# Patient Record
Sex: Female | Born: 1965 | Race: White | Hispanic: No | Smoking: Former smoker
Health system: Southern US, Community
[De-identification: ages and names within clinical notes are randomized; demographics above are authoritative.]

## PROBLEM LIST (undated history)

## (undated) DIAGNOSIS — R519 Headache, unspecified: Secondary | ICD-10-CM

## (undated) DIAGNOSIS — M5136 Other intervertebral disc degeneration, lumbar region: Secondary | ICD-10-CM

## (undated) DIAGNOSIS — I10 Essential (primary) hypertension: Secondary | ICD-10-CM

## (undated) DIAGNOSIS — G8929 Other chronic pain: Secondary | ICD-10-CM

## (undated) DIAGNOSIS — M1990 Unspecified osteoarthritis, unspecified site: Secondary | ICD-10-CM

## (undated) DIAGNOSIS — M549 Dorsalgia, unspecified: Secondary | ICD-10-CM

## (undated) DIAGNOSIS — R51 Headache: Secondary | ICD-10-CM

## (undated) DIAGNOSIS — M51369 Other intervertebral disc degeneration, lumbar region without mention of lumbar back pain or lower extremity pain: Secondary | ICD-10-CM

## (undated) DIAGNOSIS — I341 Nonrheumatic mitral (valve) prolapse: Secondary | ICD-10-CM

## (undated) HISTORY — PX: JOINT REPLACEMENT: SHX530

## (undated) HISTORY — PX: KNEE ARTHROSCOPY: SUR90

## (undated) HISTORY — PX: TOTAL HIP ARTHROPLASTY: SHX124

## (undated) HISTORY — PX: ABDOMINAL HYSTERECTOMY: SHX81

## (undated) HISTORY — PX: TONSILLECTOMY: SUR1361

## (undated) HISTORY — PX: SPINAL CORD STIMULATOR IMPLANT: SHX2422

## (undated) HISTORY — DX: Essential (primary) hypertension: I10

## (undated) HISTORY — PX: SPINAL CORD STIMULATOR REMOVAL: SHX2423

---

## 2001-08-03 ENCOUNTER — Encounter: Payer: Self-pay | Admitting: Internal Medicine

## 2001-08-03 ENCOUNTER — Ambulatory Visit (HOSPITAL_COMMUNITY): Admission: RE | Admit: 2001-08-03 | Discharge: 2001-08-03 | Payer: Self-pay | Admitting: Internal Medicine

## 2002-02-10 ENCOUNTER — Encounter: Payer: Self-pay | Admitting: Orthopedic Surgery

## 2002-02-11 ENCOUNTER — Inpatient Hospital Stay (HOSPITAL_COMMUNITY): Admission: RE | Admit: 2002-02-11 | Discharge: 2002-02-15 | Payer: Self-pay | Admitting: Orthopedic Surgery

## 2002-02-11 ENCOUNTER — Encounter: Payer: Self-pay | Admitting: Orthopedic Surgery

## 2002-03-17 ENCOUNTER — Encounter: Admission: RE | Admit: 2002-03-17 | Discharge: 2002-06-15 | Payer: Self-pay | Admitting: Orthopedic Surgery

## 2002-08-15 ENCOUNTER — Emergency Department (HOSPITAL_COMMUNITY): Admission: EM | Admit: 2002-08-15 | Discharge: 2002-08-15 | Payer: Self-pay | Admitting: Emergency Medicine

## 2011-01-30 ENCOUNTER — Other Ambulatory Visit: Payer: Self-pay | Admitting: Physical Medicine and Rehabilitation

## 2011-01-30 DIAGNOSIS — M545 Low back pain: Secondary | ICD-10-CM

## 2011-01-31 ENCOUNTER — Ambulatory Visit
Admission: RE | Admit: 2011-01-31 | Discharge: 2011-01-31 | Disposition: A | Discharge status: Home / Self Care | Payer: Medicare Other | Source: Ambulatory Visit | Attending: Physical Medicine and Rehabilitation | Admitting: Physical Medicine and Rehabilitation

## 2011-01-31 DIAGNOSIS — M545 Low back pain: Secondary | ICD-10-CM

## 2011-01-31 MED ORDER — GADOBENATE DIMEGLUMINE 529 MG/ML IV SOLN
10.0000 mL | Freq: Once | INTRAVENOUS | Status: AC | PRN
  Administered 2011-01-31: 10 mL via INTRAVENOUS

## 2011-02-06 ENCOUNTER — Other Ambulatory Visit: Payer: Self-pay | Admitting: Physical Medicine and Rehabilitation

## 2011-02-06 ENCOUNTER — Ambulatory Visit
Admission: RE | Admit: 2011-02-06 | Discharge: 2011-02-06 | Disposition: A | Discharge status: Home / Self Care | Payer: Medicare Other | Source: Ambulatory Visit | Attending: Physical Medicine and Rehabilitation | Admitting: Physical Medicine and Rehabilitation

## 2011-02-06 DIAGNOSIS — M549 Dorsalgia, unspecified: Secondary | ICD-10-CM

## 2011-06-11 ENCOUNTER — Other Ambulatory Visit: Payer: Self-pay | Admitting: Physical Medicine and Rehabilitation

## 2011-06-11 DIAGNOSIS — M25562 Pain in left knee: Secondary | ICD-10-CM

## 2011-06-16 ENCOUNTER — Inpatient Hospital Stay: Admission: RE | Admit: 2011-06-16 | Payer: Medicare Other | Source: Ambulatory Visit

## 2011-07-09 ENCOUNTER — Ambulatory Visit
Admission: RE | Admit: 2011-07-09 | Discharge: 2011-07-09 | Disposition: A | Discharge status: Home / Self Care | Payer: Medicare Other | Source: Ambulatory Visit | Attending: Physical Medicine and Rehabilitation | Admitting: Physical Medicine and Rehabilitation

## 2011-07-09 DIAGNOSIS — M25562 Pain in left knee: Secondary | ICD-10-CM

## 2011-08-01 ENCOUNTER — Other Ambulatory Visit: Payer: Self-pay | Admitting: Neurosurgery

## 2011-08-01 DIAGNOSIS — M519 Unspecified thoracic, thoracolumbar and lumbosacral intervertebral disc disorder: Secondary | ICD-10-CM

## 2011-08-01 DIAGNOSIS — M4120 Other idiopathic scoliosis, site unspecified: Secondary | ICD-10-CM

## 2011-08-01 DIAGNOSIS — M545 Low back pain: Secondary | ICD-10-CM

## 2011-08-09 ENCOUNTER — Ambulatory Visit
Admission: RE | Admit: 2011-08-09 | Discharge: 2011-08-09 | Disposition: A | Discharge status: Home / Self Care | Payer: Medicare Other | Source: Ambulatory Visit | Attending: Neurosurgery | Admitting: Neurosurgery

## 2011-08-09 DIAGNOSIS — M519 Unspecified thoracic, thoracolumbar and lumbosacral intervertebral disc disorder: Secondary | ICD-10-CM

## 2011-08-09 DIAGNOSIS — M545 Low back pain: Secondary | ICD-10-CM

## 2011-08-09 DIAGNOSIS — M4120 Other idiopathic scoliosis, site unspecified: Secondary | ICD-10-CM

## 2011-08-09 MED ORDER — GADOBENATE DIMEGLUMINE 529 MG/ML IV SOLN
9.0000 mL | Freq: Once | INTRAVENOUS | Status: AC | PRN
  Administered 2011-08-09: 9 mL via INTRAVENOUS

## 2012-06-03 ENCOUNTER — Other Ambulatory Visit (HOSPITAL_COMMUNITY): Payer: Self-pay | Admitting: Orthopedic Surgery

## 2012-06-03 DIAGNOSIS — M25561 Pain in right knee: Secondary | ICD-10-CM

## 2012-06-10 ENCOUNTER — Encounter (HOSPITAL_COMMUNITY)
Admission: RE | Admit: 2012-06-10 | Discharge: 2012-06-10 | Disposition: A | Discharge status: Home / Self Care | Payer: Medicare Other | Source: Ambulatory Visit | Attending: Orthopedic Surgery | Admitting: Orthopedic Surgery

## 2012-06-10 DIAGNOSIS — M25561 Pain in right knee: Secondary | ICD-10-CM

## 2012-06-10 DIAGNOSIS — M25569 Pain in unspecified knee: Secondary | ICD-10-CM | POA: Insufficient documentation

## 2012-06-10 MED ORDER — TECHNETIUM TC 99M MEDRONATE IV KIT
25.0000 | PACK | Freq: Once | INTRAVENOUS | Status: AC | PRN
  Administered 2012-06-10: 25 via INTRAVENOUS

## 2012-09-09 ENCOUNTER — Other Ambulatory Visit: Payer: Self-pay | Admitting: Physical Medicine and Rehabilitation

## 2012-09-09 DIAGNOSIS — M545 Low back pain, unspecified: Secondary | ICD-10-CM

## 2012-09-20 ENCOUNTER — Ambulatory Visit
Admission: RE | Admit: 2012-09-20 | Discharge: 2012-09-20 | Disposition: A | Discharge status: Home / Self Care | Payer: Medicare Other | Source: Ambulatory Visit | Attending: Physical Medicine and Rehabilitation | Admitting: Physical Medicine and Rehabilitation

## 2012-09-20 DIAGNOSIS — M545 Low back pain: Secondary | ICD-10-CM

## 2016-10-16 ENCOUNTER — Other Ambulatory Visit: Payer: Self-pay | Admitting: Orthopedic Surgery

## 2016-10-16 DIAGNOSIS — M25511 Pain in right shoulder: Secondary | ICD-10-CM

## 2016-10-27 ENCOUNTER — Ambulatory Visit
Admission: RE | Admit: 2016-10-27 | Discharge: 2016-10-27 | Disposition: A | Discharge status: Home / Self Care | Payer: Medicaid Other | Source: Ambulatory Visit | Attending: Orthopedic Surgery | Admitting: Orthopedic Surgery

## 2016-10-27 DIAGNOSIS — M25511 Pain in right shoulder: Secondary | ICD-10-CM

## 2016-11-19 ENCOUNTER — Encounter (HOSPITAL_BASED_OUTPATIENT_CLINIC_OR_DEPARTMENT_OTHER): Payer: Self-pay | Admitting: *Deleted

## 2016-11-20 ENCOUNTER — Other Ambulatory Visit: Payer: Self-pay

## 2016-11-20 ENCOUNTER — Encounter (HOSPITAL_BASED_OUTPATIENT_CLINIC_OR_DEPARTMENT_OTHER)
Admission: RE | Admit: 2016-11-20 | Discharge: 2016-11-20 | Disposition: A | Discharge status: Home / Self Care | Payer: Medicare Other | Source: Ambulatory Visit | Attending: Orthopedic Surgery | Admitting: Orthopedic Surgery

## 2016-11-20 DIAGNOSIS — I4891 Unspecified atrial fibrillation: Secondary | ICD-10-CM | POA: Diagnosis not present

## 2016-11-20 DIAGNOSIS — M1990 Unspecified osteoarthritis, unspecified site: Secondary | ICD-10-CM | POA: Diagnosis not present

## 2016-11-20 DIAGNOSIS — Z87891 Personal history of nicotine dependence: Secondary | ICD-10-CM | POA: Diagnosis not present

## 2016-11-20 DIAGNOSIS — M7541 Impingement syndrome of right shoulder: Secondary | ICD-10-CM | POA: Diagnosis present

## 2016-11-20 DIAGNOSIS — I429 Cardiomyopathy, unspecified: Secondary | ICD-10-CM | POA: Diagnosis not present

## 2016-11-20 DIAGNOSIS — M75101 Unspecified rotator cuff tear or rupture of right shoulder, not specified as traumatic: Secondary | ICD-10-CM | POA: Diagnosis not present

## 2016-11-20 DIAGNOSIS — I509 Heart failure, unspecified: Secondary | ICD-10-CM | POA: Diagnosis not present

## 2016-11-20 NOTE — Progress Notes (Signed)
EKG reviewed by Dr Kieth Brightly. OK to proceed with scheduled surgical procedure 11/22/2016.

## 2016-11-20 NOTE — H&P (Signed)
  This is a pleasant 51 year-old new patient who presents to our clinic today with right shoulder pain.  This began about two weeks ago with no known injury or change in activity.  All of her pain is to the top of her shoulder.  She describes this as constant in nature.  Worse with internal rotation and forward flexion.  She is unable to sleep on the affected side.  She has tried oral anti-inflammatories without relief of symptoms.  No radicular symptoms noted.  No previous injury to the shoulder.   Past medical history: Significant for glasses and pneumonia.  Negative for diabetes, hypertension, heart disease or arthritis.   Allergies: No known drug allergies. Current medications: Oral NSAIDs p.r.n.  Family history: Significant for heart disease and arthritis.  Negative for diabetes and hypertension.   Social history: Does not smoke or drink.  She is disabled.  She is widowed.    EXAMINATION: Well-developed, well-nourished female in no acute distress.  Alert and oriented x 3.  Height: 5?8.  Weight: 101 pounds.  Blood pressure: 103/59.  Pulse: 74.  Examination of her right shoulder reveals 50% active range of motion in all planes.  Markedly positive empty can and cross body.  Negative drop arm.  She is neurovascularly intact distally.     X-RAYS: X-rays reveal a Type II acromion.  Moderate AC degenerative changes.  Okay glenohumeral space.    IMPRESSION: Right shoulder subacromial bursitis and rotator cuff tendonitis.    PLAN:  Today we will proceed with a 1:4 Depo-Medrol/Marcaine injection to the subacromial space on the right, followed by a Jobe exercise program.  If she is not any better in the next 2-3 weeks she will call and let me know and we will get an MRI to assess her rotator cuff.  PROCEDURE NOTE: The patient's clinical condition is marked by substantial pain and/or significant functional disability.  Other conservative therapy has not provided relief, is contraindicated, or not  appropriate.  There is a reasonable likelihood that injection will significantly improve the patient's pain and/or functional disability. Patient is seated on the exam table.  The right shoulder is prepped with Betadine and alcohol and injected into the subacromial space with 40 mg of Depo-Medrol and 4 cc of Marcaine.  Patient tolerated the procedure without difficulty.   Addendum: This is a pleasant 51 year-old female who presents to our clinic today to discuss MRI results of her right shoulder.  MRI results of the right shoulder from October 28, 2016 reveal mild supraspinatus and infraspinatus tendonitis with a possible mild bursal surface fraying distal supraspinatus tendon with edema in the rotator interval.  However, no rotator cuff tear identified.  Everything else is intact.  Breylin has continued to exhibit excruciating pain and is starting to develop adhesive capsulitis.  She would like to proceed with right shoulder arthroscopic decompression and possible rotator cuff repair.  Risks, benefits and possible complications of surgery have been reviewed.  Rehab and recovery time discussed.  All questions answered.  Paperwork complete.

## 2016-11-22 ENCOUNTER — Encounter (HOSPITAL_BASED_OUTPATIENT_CLINIC_OR_DEPARTMENT_OTHER): Payer: Self-pay | Admitting: Anesthesiology

## 2016-11-22 ENCOUNTER — Ambulatory Visit (HOSPITAL_BASED_OUTPATIENT_CLINIC_OR_DEPARTMENT_OTHER)
Admission: RE | Admit: 2016-11-22 | Discharge: 2016-11-22 | Disposition: A | Discharge status: Home / Self Care | Payer: Medicare Other | Source: Ambulatory Visit | Attending: Orthopedic Surgery | Admitting: Orthopedic Surgery

## 2016-11-22 ENCOUNTER — Ambulatory Visit (HOSPITAL_BASED_OUTPATIENT_CLINIC_OR_DEPARTMENT_OTHER): Payer: Medicare Other | Admitting: Anesthesiology

## 2016-11-22 ENCOUNTER — Encounter (HOSPITAL_BASED_OUTPATIENT_CLINIC_OR_DEPARTMENT_OTHER)
Admission: RE | Disposition: A | Discharge status: Home / Self Care | Payer: Self-pay | Source: Ambulatory Visit | Attending: Orthopedic Surgery

## 2016-11-22 DIAGNOSIS — I509 Heart failure, unspecified: Secondary | ICD-10-CM | POA: Insufficient documentation

## 2016-11-22 DIAGNOSIS — M7541 Impingement syndrome of right shoulder: Secondary | ICD-10-CM | POA: Diagnosis not present

## 2016-11-22 DIAGNOSIS — M75101 Unspecified rotator cuff tear or rupture of right shoulder, not specified as traumatic: Secondary | ICD-10-CM | POA: Diagnosis not present

## 2016-11-22 DIAGNOSIS — I4891 Unspecified atrial fibrillation: Secondary | ICD-10-CM | POA: Insufficient documentation

## 2016-11-22 DIAGNOSIS — I429 Cardiomyopathy, unspecified: Secondary | ICD-10-CM | POA: Insufficient documentation

## 2016-11-22 DIAGNOSIS — Z87891 Personal history of nicotine dependence: Secondary | ICD-10-CM | POA: Insufficient documentation

## 2016-11-22 DIAGNOSIS — M1990 Unspecified osteoarthritis, unspecified site: Secondary | ICD-10-CM | POA: Insufficient documentation

## 2016-11-22 HISTORY — DX: Headache: R51

## 2016-11-22 HISTORY — DX: Dorsalgia, unspecified: M54.9

## 2016-11-22 HISTORY — DX: Other intervertebral disc degeneration, lumbar region: M51.36

## 2016-11-22 HISTORY — DX: Other chronic pain: G89.29

## 2016-11-22 HISTORY — DX: Headache, unspecified: R51.9

## 2016-11-22 HISTORY — DX: Unspecified osteoarthritis, unspecified site: M19.90

## 2016-11-22 HISTORY — DX: Nonrheumatic mitral (valve) prolapse: I34.1

## 2016-11-22 HISTORY — DX: Other intervertebral disc degeneration, lumbar region without mention of lumbar back pain or lower extremity pain: M51.369

## 2016-11-22 SURGERY — SHOULDER ARTHROSCOPY WITH SUBACROMIAL DECOMPRESSION AND DISTAL CLAVICLE EXCISION
Anesthesia: General | Site: Shoulder | Laterality: Right

## 2016-11-22 MED ORDER — FENTANYL CITRATE (PF) 100 MCG/2ML IJ SOLN
INTRAMUSCULAR | Status: AC
  Filled 2016-11-22: qty 2

## 2016-11-22 MED ORDER — LIDOCAINE 2% (20 MG/ML) 5 ML SYRINGE
INTRAMUSCULAR | Status: DC | PRN
  Administered 2016-11-22: 100 mg via INTRAVENOUS

## 2016-11-22 MED ORDER — ONDANSETRON HCL 4 MG/2ML IJ SOLN
INTRAMUSCULAR | Status: DC | PRN
  Administered 2016-11-22: 4 mg via INTRAVENOUS

## 2016-11-22 MED ORDER — MIDAZOLAM HCL 2 MG/2ML IJ SOLN
INTRAMUSCULAR | Status: AC
  Filled 2016-11-22: qty 2

## 2016-11-22 MED ORDER — CHLORHEXIDINE GLUCONATE 4 % EX LIQD: 60.0000 mL | Freq: Once | CUTANEOUS | Status: DC

## 2016-11-22 MED ORDER — OXYCODONE-ACETAMINOPHEN 5-325 MG PO TABS
ORAL_TABLET | ORAL | Status: AC
  Filled 2016-11-22: qty 1

## 2016-11-22 MED ORDER — ONDANSETRON HCL 4 MG PO TABS: 4.0000 mg | ORAL_TABLET | Freq: Three times a day (TID) | ORAL | 0 refills | Status: DC | PRN

## 2016-11-22 MED ORDER — PROPOFOL 500 MG/50ML IV EMUL
INTRAVENOUS | Status: AC
  Filled 2016-11-22: qty 50

## 2016-11-22 MED ORDER — DEXAMETHASONE SODIUM PHOSPHATE 10 MG/ML IJ SOLN
INTRAMUSCULAR | Status: AC
  Filled 2016-11-22: qty 1

## 2016-11-22 MED ORDER — ROPIVACAINE HCL 7.5 MG/ML IJ SOLN
INTRAMUSCULAR | Status: DC | PRN
  Administered 2016-11-22: 20 mL via PERINEURAL

## 2016-11-22 MED ORDER — SODIUM CHLORIDE 0.9 % IR SOLN
Status: DC | PRN
  Administered 2016-11-22: 8000 mL

## 2016-11-22 MED ORDER — CEFAZOLIN SODIUM-DEXTROSE 2-4 GM/100ML-% IV SOLN
2.0000 g | INTRAVENOUS | Status: AC
  Administered 2016-11-22: 2 g via INTRAVENOUS

## 2016-11-22 MED ORDER — SUCCINYLCHOLINE CHLORIDE 20 MG/ML IJ SOLN
INTRAMUSCULAR | Status: DC | PRN
  Administered 2016-11-22: 40 mg via INTRAVENOUS

## 2016-11-22 MED ORDER — LACTATED RINGERS IV SOLN: INTRAVENOUS | Status: DC

## 2016-11-22 MED ORDER — MIDAZOLAM HCL 2 MG/2ML IJ SOLN
1.0000 mg | INTRAMUSCULAR | Status: DC | PRN
  Administered 2016-11-22: 2 mg via INTRAVENOUS

## 2016-11-22 MED ORDER — ONDANSETRON HCL 4 MG/2ML IJ SOLN
INTRAMUSCULAR | Status: AC
  Filled 2016-11-22: qty 2

## 2016-11-22 MED ORDER — OXYCODONE-ACETAMINOPHEN 5-325 MG PO TABS
1.0000 | ORAL_TABLET | Freq: Once | ORAL | Status: AC
Start: 2016-11-22 — End: 2016-11-22
  Administered 2016-11-22: 1 via ORAL

## 2016-11-22 MED ORDER — CEFAZOLIN SODIUM-DEXTROSE 2-4 GM/100ML-% IV SOLN
INTRAVENOUS | Status: AC
  Filled 2016-11-22: qty 100

## 2016-11-22 MED ORDER — OXYCODONE-ACETAMINOPHEN 5-325 MG PO TABS: ORAL_TABLET | ORAL | 0 refills | Status: DC

## 2016-11-22 MED ORDER — DEXAMETHASONE SODIUM PHOSPHATE 4 MG/ML IJ SOLN
INTRAMUSCULAR | Status: DC | PRN
  Administered 2016-11-22: 10 mg via INTRAVENOUS

## 2016-11-22 MED ORDER — LACTATED RINGERS IV SOLN
INTRAVENOUS | Status: DC
Start: 2016-11-22 — End: 2016-11-22
  Administered 2016-11-22: 07:00:00 via INTRAVENOUS

## 2016-11-22 MED ORDER — SCOPOLAMINE 1 MG/3DAYS TD PT72: 1.0000 | MEDICATED_PATCH | Freq: Once | TRANSDERMAL | Status: DC | PRN

## 2016-11-22 MED ORDER — SUCCINYLCHOLINE CHLORIDE 200 MG/10ML IV SOSY
PREFILLED_SYRINGE | INTRAVENOUS | Status: AC
  Filled 2016-11-22: qty 10

## 2016-11-22 MED ORDER — PROMETHAZINE HCL 25 MG/ML IJ SOLN: 6.2500 mg | INTRAMUSCULAR | Status: DC | PRN

## 2016-11-22 MED ORDER — PROPOFOL 10 MG/ML IV BOLUS
INTRAVENOUS | Status: DC | PRN
Start: 2016-11-22 — End: 2016-11-22
  Administered 2016-11-22: 130 mg via INTRAVENOUS

## 2016-11-22 MED ORDER — FENTANYL CITRATE (PF) 100 MCG/2ML IJ SOLN
50.0000 ug | INTRAMUSCULAR | Status: DC | PRN
  Administered 2016-11-22: 50 ug via INTRAVENOUS

## 2016-11-22 MED ORDER — LIDOCAINE 2% (20 MG/ML) 5 ML SYRINGE
INTRAMUSCULAR | Status: AC
  Filled 2016-11-22: qty 5

## 2016-11-22 MED ORDER — FENTANYL CITRATE (PF) 100 MCG/2ML IJ SOLN
25.0000 ug | INTRAMUSCULAR | Status: DC | PRN
  Administered 2016-11-22 (×2): 25 ug via INTRAVENOUS

## 2016-11-22 SURGICAL SUPPLY — 73 items
AID PSTN UNV HD RSTRNT DISP (MISCELLANEOUS) ×1
APL SKNCLS STERI-STRIP NONHPOA (GAUZE/BANDAGES/DRESSINGS)
BENZOIN TINCTURE PRP APPL 2/3 (GAUZE/BANDAGES/DRESSINGS) IMPLANT
BLADE CUTTER GATOR 3.5 (BLADE) ×3 IMPLANT
BLADE CUTTER MENIS 5.5 (BLADE) IMPLANT
BLADE GREAT WHITE 4.2 (BLADE) ×2 IMPLANT
BLADE GREAT WHITE 4.2MM (BLADE) ×1
BLADE SURG 15 STRL LF DISP TIS (BLADE) ×1 IMPLANT
BLADE SURG 15 STRL SS (BLADE)
BUR OVAL 6.0 (BURR) ×3 IMPLANT
CANNULA DRY DOC 8X75 (CANNULA) IMPLANT
CANNULA TWIST IN 8.25X7CM (CANNULA) IMPLANT
CLOSURE WOUND 1/2 X4 (GAUZE/BANDAGES/DRESSINGS)
DECANTER SPIKE VIAL GLASS SM (MISCELLANEOUS) IMPLANT
DRAPE STERI 35X30 U-POUCH (DRAPES) ×3 IMPLANT
DRAPE U-SHAPE 47X51 STRL (DRAPES) ×3 IMPLANT
DRAPE U-SHAPE 76X120 STRL (DRAPES) ×6 IMPLANT
DRSG PAD ABDOMINAL 8X10 ST (GAUZE/BANDAGES/DRESSINGS) ×3 IMPLANT
DURAPREP 26ML APPLICATOR (WOUND CARE) ×3 IMPLANT
ELECT MENISCUS 165MM 90D (ELECTRODE) ×3 IMPLANT
ELECT REM PT RETURN 9FT ADLT (ELECTROSURGICAL) ×3
ELECTRODE REM PT RTRN 9FT ADLT (ELECTROSURGICAL) ×1 IMPLANT
GAUZE SPONGE 4X4 12PLY STRL (GAUZE/BANDAGES/DRESSINGS) ×6 IMPLANT
GAUZE XEROFORM 1X8 LF (GAUZE/BANDAGES/DRESSINGS) ×3 IMPLANT
GLOVE BIOGEL PI IND STRL 7.0 (GLOVE) ×1 IMPLANT
GLOVE BIOGEL PI INDICATOR 7.0 (GLOVE) ×6
GLOVE ECLIPSE 6.5 STRL STRAW (GLOVE) ×2 IMPLANT
GLOVE ECLIPSE 7.0 STRL STRAW (GLOVE) ×3 IMPLANT
GLOVE SURG ORTHO 8.0 STRL STRW (GLOVE) ×3 IMPLANT
GOWN STRL REUS W/ TWL LRG LVL3 (GOWN DISPOSABLE) ×1 IMPLANT
GOWN STRL REUS W/ TWL XL LVL3 (GOWN DISPOSABLE) ×2 IMPLANT
GOWN STRL REUS W/TWL LRG LVL3 (GOWN DISPOSABLE) ×3
GOWN STRL REUS W/TWL XL LVL3 (GOWN DISPOSABLE) ×6
IV NS IRRIG 3000ML ARTHROMATIC (IV SOLUTION) ×10 IMPLANT
MANIFOLD NEPTUNE II (INSTRUMENTS) ×3 IMPLANT
NDL SCORPION MULTI FIRE (NEEDLE) IMPLANT
NDL SUT 6 .5 CRC .975X.05 MAYO (NEEDLE) IMPLANT
NEEDLE MAYO TAPER (NEEDLE)
NEEDLE SCORPION MULTI FIRE (NEEDLE) IMPLANT
NS IRRIG 1000ML POUR BTL (IV SOLUTION) IMPLANT
PACK ARTHROSCOPY DSU (CUSTOM PROCEDURE TRAY) ×3 IMPLANT
PASSER SUT SWANSON 36MM LOOP (INSTRUMENTS) IMPLANT
PENCIL BUTTON HOLSTER BLD 10FT (ELECTRODE) ×3 IMPLANT
RESTRAINT HEAD UNIVERSAL NS (MISCELLANEOUS) ×3 IMPLANT
SET ARTHROSCOPY TUBING (MISCELLANEOUS) ×3
SET ARTHROSCOPY TUBING LN (MISCELLANEOUS) ×1 IMPLANT
SLEEVE SCD COMPRESS KNEE MED (MISCELLANEOUS) ×2 IMPLANT
SLING ARM FOAM STRAP LRG (SOFTGOODS) ×2 IMPLANT
SLING ARM IMMOBILIZER LRG (SOFTGOODS) IMPLANT
SLING ARM IMMOBILIZER MED (SOFTGOODS) IMPLANT
SLING ARM MED ADULT FOAM STRAP (SOFTGOODS) IMPLANT
SLING ARM XL FOAM STRAP (SOFTGOODS) IMPLANT
SPONGE LAP 4X18 X RAY DECT (DISPOSABLE) IMPLANT
STRIP CLOSURE SKIN 1/2X4 (GAUZE/BANDAGES/DRESSINGS) IMPLANT
SUCTION FRAZIER HANDLE 10FR (MISCELLANEOUS)
SUCTION TUBE FRAZIER 10FR DISP (MISCELLANEOUS) IMPLANT
SUT ETHIBOND 2 OS 4 DA (SUTURE) IMPLANT
SUT ETHILON 2 0 FS 18 (SUTURE) IMPLANT
SUT ETHILON 3 0 PS 1 (SUTURE) ×2 IMPLANT
SUT FIBERWIRE #2 38 T-5 BLUE (SUTURE)
SUT RETRIEVER MED (INSTRUMENTS) IMPLANT
SUT TIGER TAPE 7 IN WHITE (SUTURE) IMPLANT
SUT VIC AB 0 CT1 27 (SUTURE)
SUT VIC AB 0 CT1 27XBRD ANBCTR (SUTURE) IMPLANT
SUT VIC AB 2-0 SH 27 (SUTURE)
SUT VIC AB 2-0 SH 27XBRD (SUTURE) IMPLANT
SUT VIC AB 3-0 FS2 27 (SUTURE) IMPLANT
SUTURE FIBERWR #2 38 T-5 BLUE (SUTURE) IMPLANT
TAPE FIBER 2MM 7IN #2 BLUE (SUTURE) IMPLANT
TOWEL OR 17X24 6PK STRL BLUE (TOWEL DISPOSABLE) ×3 IMPLANT
TOWEL OR NON WOVEN STRL DISP B (DISPOSABLE) ×3 IMPLANT
WATER STERILE IRR 1000ML POUR (IV SOLUTION) ×3 IMPLANT
YANKAUER SUCT BULB TIP NO VENT (SUCTIONS) IMPLANT

## 2016-11-22 NOTE — Interval H&P Note (Signed)
History and Physical Interval Note:  11/22/2016 7:31 AM  Christine Roberson  has presented today for surgery, with the diagnosis of Other articular4 cartilage disorders right shoulder Primary osteoarthritis right shoulder Impingment syndrome of right shoulder  Strain of muscle(s) and tendon(s) of the rotator cuff of  The various methods of treatment have been discussed with the patient and family. After consideration of risks, benefits and other options for treatment, the patient has consented to  Procedure(s): RIGHT SHOULDER ARTHROSCOPY WITH DEBRIDEMENT, DISTAL CLAVICAL EXCISION, ACROMIOPLASTY, ROTATOR CUFF REPAIR AND BICEP TENODESIS, SHOULDER MANIPULATION (Right) as a surgical intervention .  The patient's history has been reviewed, patient examined, no change in status, stable for surgery.  I have reviewed the patient's chart and labs.  Questions were answered to the patient's satisfaction.     Loreta Ave

## 2016-11-22 NOTE — Op Note (Signed)
NAMECATELIN, VOGEN NO.:  [id number]  MEDICAL RECORD NO.:  [id number]  LOCATION:                                 FACILITY:  PHYSICIAN:  Loreta Ave, M.D.      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  Rotator cuff tendonitis.  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  Rotator cuff tendonitis without full-thickness cuff tear.  Degenerative tearing, anterior labrum.  PROCEDURE:  Right shoulder exam under anesthesia, arthroscopy. Debridement of labrum.  Debridement rotator cuff.  Bursectomy, acromioplasty, coracoacromial ligament release.  Excision of distal clavicle.  SURGEON:  Loreta Ave, M.D.  ASSISTANT:  Tessa Lerner, PA, present throughout the entire case and necessary for timely completion of procedure.  ANESTHESIA:  General.  BLOOD LOSS:  Minimal.  SPECIMENS:  None.  CULTURES:  None.  COMPLICATIONS:  None.  DRESSINGS:  Sterile compressive with sling.  DESCRIPTION OF PROCEDURE:  The patient was brought to the operating room, placed on the operating table in supine position.  After adequate anesthesia had been obtained, shoulder examined.  Some very slight limitation of motion, easily manipulated achieving full motion and stable shoulder.  Placed in beach-chair position on the shoulder positioner, prepped and draped in usual sterile fashion.  Three portals, anterior, posterior, and lateral.  Arthroscope introduced, shoulder distended and inspected.  Degenerative tearing anterior labrum debrided. Biceps tendon biceps anchor intact.  Undersurface of the cuff articular cartilage looked good.  Cannula redirected subacromially.  Abrasive changes on the top of the cuff debrided.  Nothing full-thickness.  Bursa resected.   Acromioplasty from type 2 to type 1 acromion releasing CA ligament.  Grade 4 changes of AC joint.  Lateral centimeter of clavicle resected.  Adequacy of decompression and debridement confirmed viewing from all portals.  Instruments and fluid removed.  Portals were closed with nylon.  Sterile compressive dressing. Shoulder immobilizer.  Anesthesia reversed.  Brought to the recovery room.  Tolerated the surgery well.  No complications.     Loreta Ave, M.D.   ______________________________ Loreta Ave, M.D.    DFM/MEDQ  D:  11/22/2016  T:  11/22/2016  Job:  [id number]

## 2016-11-22 NOTE — Transfer of Care (Signed)
Immediate Anesthesia Transfer of Care Note  Patient: Christine Roberson  Procedure(s) Performed: Procedure(s): RIGHT SHOULDER ARTHROSCOPY WITH DEBRIDEMENT, DISTAL CLAVICAL EXCISION, ACROMIOPLASTY (Right)  Patient Location: PACU  Anesthesia Type:General  Level of Consciousness: awake and sedated  Airway & Oxygen Therapy: Patient Spontanous Breathing and Patient connected to face mask oxygen  Post-op Assessment: Report given to RN and Post -op Vital signs reviewed and stable  Post vital signs: Reviewed and stable  Last Vitals:  Vitals:   11/22/16 0713 11/22/16 0714  BP:    Pulse: 66 68  Resp: 11 12  Temp:    SpO2: 100% 100%    Last Pain:  Vitals:   11/22/16 0635  TempSrc: Oral  PainSc: 8       Patients Stated Pain Goal: 4 (11/22/16 2376)  Complications: No apparent anesthesia complications

## 2016-11-22 NOTE — Progress Notes (Signed)
Assisted Dr. Turk with right, ultrasound guided, interscalene  block. Side rails up, monitors on throughout procedure. See vital signs in flow sheet. Tolerated Procedure well. 

## 2016-11-22 NOTE — Anesthesia Procedure Notes (Signed)
Procedure Name: Intubation Performed by: Braden Cimo W Pre-anesthesia Checklist: Patient identified, Emergency Drugs available, Suction available and Patient being monitored Patient Re-evaluated:Patient Re-evaluated prior to induction Oxygen Delivery Method: Circle system utilized Preoxygenation: Pre-oxygenation with 100% oxygen Induction Type: IV induction Ventilation: Mask ventilation without difficulty Laryngoscope Size: Miller and 2 Grade View: Grade I Tube type: Oral Tube size: 7.0 mm Number of attempts: 1 Airway Equipment and Method: Stylet Placement Confirmation: ETT inserted through vocal cords under direct vision,  positive ETCO2 and breath sounds checked- equal and bilateral Secured at: 22 cm Tube secured with: Tape Dental Injury: Teeth and Oropharynx as per pre-operative assessment        

## 2016-11-22 NOTE — Anesthesia Procedure Notes (Signed)
Anesthesia Regional Block: Interscalene brachial plexus block   Pre-Anesthetic Checklist: ,, timeout performed, Correct Patient, Correct Site, Correct Laterality, Correct Procedure, Correct Position, site marked, Risks and benefits discussed,  Surgical consent,  Pre-op evaluation,  At surgeon's request and post-op pain management  Laterality: Right  Prep: chloraprep       Needles:  Injection technique: Single-shot  Needle Type: Echogenic Needle     Needle Length: 5cm  Needle Gauge: 21     Additional Needles:   Procedures: ultrasound guided,,,,,,,,  Narrative:  Start time: 11/22/2016 7:05 AM End time: 11/22/2016 7:10 AM Injection made incrementally with aspirations every 5 mL.  Performed by: Personally  Anesthesiologist: Cecile Hearing  Additional Notes: No pain on injection. No increased resistance to injection. Injection made in 5cc increments.  Good needle visualization.  Patient tolerated procedure well.

## 2016-11-22 NOTE — Anesthesia Preprocedure Evaluation (Addendum)
Anesthesia Evaluation  Patient identified by MRN, date of birth, ID band Patient awake    Reviewed: Allergy & Precautions, NPO status , Patient's Chart, lab work & pertinent test results, reviewed documented beta blocker date and time   Airway Mallampati: III  TM Distance: >3 FB Neck ROM: Full    Dental  (+) Dental Advisory Given, Edentulous Lower, Upper Dentures   Pulmonary former smoker,    Pulmonary exam normal breath sounds clear to auscultation       Cardiovascular +CHF  Normal cardiovascular exam+ dysrhythmias Atrial Fibrillation + Valvular Problems/Murmurs MVP  Rhythm:Regular Rate:Normal  mild nonischemic cardiomyopathy with an LVEF around 45-50%   Neuro/Psych  Headaches, negative psych ROS   GI/Hepatic negative GI ROS, Neg liver ROS,   Endo/Other  negative endocrine ROS  Renal/GU negative Renal ROS     Musculoskeletal  (+) Arthritis , Osteoarthritis,    Abdominal   Peds  Hematology negative hematology ROS (+)   Anesthesia Other Findings Day of surgery medications reviewed with the patient.  Reproductive/Obstetrics                           Anesthesia Physical Anesthesia Plan  ASA: III  Anesthesia Plan: General   Post-op Pain Management:  Regional for Post-op pain   Induction: Intravenous  PONV Risk Score and Plan: 3 and Ondansetron, Dexamethasone and Midazolam  Airway Management Planned: Oral ETT  Additional Equipment:   Intra-op Plan:   Post-operative Plan: Extubation in OR  Informed Consent: I have reviewed the patients History and Physical, chart, labs and discussed the procedure including the risks, benefits and alternatives for the proposed anesthesia with the patient or authorized representative who has indicated his/her understanding and acceptance.   Dental advisory given  Plan Discussed with: CRNA  Anesthesia Plan Comments: (Risks/benefits of general  anesthesia discussed with patient including risk of damage to teeth, lips, gum, and tongue, nausea/vomiting, allergic reactions to medications, and the possibility of heart attack, stroke and death.  All patient questions answered.  Patient wishes to proceed.)       Anesthesia Quick Evaluation

## 2016-11-22 NOTE — Discharge Instructions (Signed)
Shouder arthroscopy, partial rotator cuff tear debridement subacromial decompression Care After Instructions Refer to this sheet in the next few weeks. These discharge instructions provide you with general information on caring for yourself after you leave the hospital. Your caregiver may also give you specific instructions. Your treatment has been planned according to the most current medical practices available, but unavoidable complications sometimes occur. If you have any problems or questions after discharge, please call your caregiver. HOME INSTRUCTIONS You may resume a normal diet and activities as directed. Take showers instead of baths until informed otherwise.  Change bandages (dressings) in 3 days.  Swab wounds daily with betadine.  Wash shoulder with soap and water.  Pat dry.  Cover wounds with bandaids. Only take over-the-counter or prescription medicines for pain, discomfort, or fever as directed by your caregiver.  Wear your sling for the next 2 days unless otherwise instructed. Eat a well-balanced diet.  Avoid lifting or driving until you are instructed otherwise.  Make an appointment to see your caregiver for stitches (suture) or staple removal one week after surgery.  SEEK MEDICAL CARE IF: You have swelling of your calf or leg.  You develop shortness of breath or chest pain.  You have redness, swelling, or increasing pain in the wound.  There is pus or any unusual drainage coming from the surgical site.  You notice a bad smell coming from the surgical site or dressing.  The surgical site breaks open after sutures or staples have been removed.  There is persistent bleeding from the suture or staple line.  You are getting worse or are not improving.  You have any other questions or concerns.  SEEK IMMEDIATE MEDICAL CARE IF:  You have a fever greater than 101 You develop a rash.  You have difficulty breathing.  You develop any reaction or side effects to medicines given.    Your knee motion is decreasing rather than improving.  MAKE SURE YOU:  Understand these instructions.  Will watch your condition.  Will get help right away if you are not doing well or get worse.      Regional Anesthesia Blocks  1. Numbness or the inability to move the "blocked" extremity may last from 3-48 hours after placement. The length of time depends on the medication injected and your individual response to the medication. If the numbness is not going away after 48 hours, call your surgeon.  2. The extremity that is blocked will need to be protected until the numbness is gone and the  Strength has returned. Because you cannot feel it, you will need to take extra care to avoid injury. Because it may be weak, you may have difficulty moving it or using it. You may not know what position it is in without looking at it while the block is in effect.  3. For blocks in the legs and feet, returning to weight bearing and walking needs to be done carefully. You will need to wait until the numbness is entirely gone and the strength has returned. You should be able to move your leg and foot normally before you try and bear weight or walk. You will need someone to be with you when you first try to ensure you do not fall and possibly risk injury.  4. Bruising and tenderness at the needle site are common side effects and will resolve in a few days.  5. Persistent numbness or new problems with movement should be communicated to the surgeon or the [facility] [phone] [facility] [phone]).        Post Anesthesia Home Care Instructions  Activity: Get plenty of rest for the remainder of the day. A responsible individual must stay with you for 24 hours following the procedure.  For the next 24 hours, DO NOT: -Drive a car -Advertising copywriter -Drink alcoholic beverages -Take any medication unless instructed by your physician -Make any legal decisions or sign  important papers.  Meals: Start with liquid foods such as gelatin or soup. Progress to regular foods as tolerated. Avoid greasy, spicy, heavy foods. If nausea and/or vomiting occur, drink only clear liquids until the nausea and/or vomiting subsides. Call your physician if vomiting continues.  Special Instructions/Symptoms: Your throat may feel dry or sore from the anesthesia or the breathing tube placed in your throat during surgery. If this causes discomfort, gargle with warm salt water. The discomfort should disappear within 24 hours.  If you had a scopolamine patch placed behind your ear for the management of post- operative nausea and/or vomiting:  1. The medication in the patch is effective for 72 hours, after which it should be removed.  Wrap patch in a tissue and discard in the trash. Wash hands thoroughly with soap and water. 2. You may remove the patch earlier than 72 hours if you experience unpleasant side effects which may include dry mouth, dizziness or visual disturbances. 3. Avoid touching the patch. Wash your hands with soap and water after contact with the patch.

## 2016-11-22 NOTE — Anesthesia Postprocedure Evaluation (Signed)
Anesthesia Post Note  Patient: Mazzy Whitefoot  Procedure(s) Performed: Procedure(s) (LRB): RIGHT SHOULDER ARTHROSCOPY WITH DEBRIDEMENT, DISTAL CLAVICAL EXCISION, ACROMIOPLASTY (Right)     Patient location during evaluation: PACU Anesthesia Type: General Level of consciousness: awake and alert Pain management: pain level controlled Vital Signs Assessment: post-procedure vital signs reviewed and stable Respiratory status: spontaneous breathing, nonlabored ventilation and respiratory function stable Cardiovascular status: blood pressure returned to baseline and stable Postop Assessment: no signs of nausea or vomiting Anesthetic complications: no    Last Vitals:  Vitals:   11/22/16 0945 11/22/16 1010  BP: (!) 102/47 (!) 103/40  Pulse: 66 64  Resp: 16 16  Temp:  (!) 36.4 C  SpO2: 99% 98%    Last Pain:  Vitals:   11/22/16 1010  TempSrc: Oral  PainSc: 5                  Cecile Hearing

## 2017-03-04 ENCOUNTER — Encounter (HOSPITAL_BASED_OUTPATIENT_CLINIC_OR_DEPARTMENT_OTHER): Payer: Self-pay | Admitting: *Deleted

## 2017-03-04 ENCOUNTER — Other Ambulatory Visit: Payer: Self-pay

## 2017-03-05 ENCOUNTER — Ambulatory Visit (HOSPITAL_BASED_OUTPATIENT_CLINIC_OR_DEPARTMENT_OTHER): Payer: Medicare Other | Admitting: Anesthesiology

## 2017-03-05 ENCOUNTER — Encounter (HOSPITAL_BASED_OUTPATIENT_CLINIC_OR_DEPARTMENT_OTHER)
Admission: RE | Disposition: A | Discharge status: Home / Self Care | Payer: Self-pay | Source: Ambulatory Visit | Attending: Orthopedic Surgery

## 2017-03-05 ENCOUNTER — Encounter (HOSPITAL_BASED_OUTPATIENT_CLINIC_OR_DEPARTMENT_OTHER): Payer: Self-pay | Admitting: *Deleted

## 2017-03-05 ENCOUNTER — Ambulatory Visit (HOSPITAL_BASED_OUTPATIENT_CLINIC_OR_DEPARTMENT_OTHER)
Admission: RE | Admit: 2017-03-05 | Discharge: 2017-03-05 | Disposition: A | Discharge status: Home / Self Care | Payer: Medicare Other | Source: Ambulatory Visit | Attending: Orthopedic Surgery | Admitting: Orthopedic Surgery

## 2017-03-05 DIAGNOSIS — Z888 Allergy status to other drugs, medicaments and biological substances status: Secondary | ICD-10-CM | POA: Diagnosis not present

## 2017-03-05 DIAGNOSIS — M1990 Unspecified osteoarthritis, unspecified site: Secondary | ICD-10-CM | POA: Insufficient documentation

## 2017-03-05 DIAGNOSIS — Z87891 Personal history of nicotine dependence: Secondary | ICD-10-CM | POA: Diagnosis not present

## 2017-03-05 DIAGNOSIS — Z79891 Long term (current) use of opiate analgesic: Secondary | ICD-10-CM | POA: Insufficient documentation

## 2017-03-05 DIAGNOSIS — M7501 Adhesive capsulitis of right shoulder: Secondary | ICD-10-CM | POA: Diagnosis not present

## 2017-03-05 DIAGNOSIS — Z79899 Other long term (current) drug therapy: Secondary | ICD-10-CM | POA: Insufficient documentation

## 2017-03-05 DIAGNOSIS — Z885 Allergy status to narcotic agent status: Secondary | ICD-10-CM | POA: Diagnosis not present

## 2017-03-05 DIAGNOSIS — G8929 Other chronic pain: Secondary | ICD-10-CM | POA: Diagnosis not present

## 2017-03-05 HISTORY — PX: CLOSED MANIPULATION SHOULDER WITH STERIOD INJECTION: SHX5611

## 2017-03-05 SURGERY — CLOSED MANIPULATION SHOULDER WITH STEROID INJECTION
Anesthesia: Monitor Anesthesia Care | Site: Shoulder | Laterality: Right

## 2017-03-05 MED ORDER — ACETAMINOPHEN 500 MG PO TABS
1000.0000 mg | ORAL_TABLET | Freq: Once | ORAL | Status: AC
  Administered 2017-03-05: 1000 mg via ORAL

## 2017-03-05 MED ORDER — METHYLPREDNISOLONE ACETATE 80 MG/ML IJ SUSP
INTRAMUSCULAR | Status: DC | PRN
  Administered 2017-03-05: 80 mg via INTRA_ARTICULAR

## 2017-03-05 MED ORDER — ACETAMINOPHEN 500 MG PO TABS: 1000.0000 mg | ORAL_TABLET | Freq: Three times a day (TID) | ORAL | 0 refills | Status: AC

## 2017-03-05 MED ORDER — BUPIVACAINE-EPINEPHRINE 0.5% -1:200000 IJ SOLN
INTRAMUSCULAR | Status: DC | PRN
  Administered 2017-03-05: 4 mL

## 2017-03-05 MED ORDER — PROPOFOL 500 MG/50ML IV EMUL
INTRAVENOUS | Status: DC | PRN
  Administered 2017-03-05: 50 ug/kg/min via INTRAVENOUS

## 2017-03-05 MED ORDER — SCOPOLAMINE 1 MG/3DAYS TD PT72: 1.0000 | MEDICATED_PATCH | Freq: Once | TRANSDERMAL | Status: DC | PRN

## 2017-03-05 MED ORDER — OXYCODONE HCL 5 MG PO TABS: 5.0000 mg | ORAL_TABLET | Freq: Four times a day (QID) | ORAL | 0 refills | Status: AC | PRN

## 2017-03-05 MED ORDER — ONDANSETRON HCL 4 MG PO TABS: 4.0000 mg | ORAL_TABLET | Freq: Three times a day (TID) | ORAL | 0 refills | Status: DC | PRN

## 2017-03-05 MED ORDER — ONDANSETRON HCL 4 MG/2ML IJ SOLN
INTRAMUSCULAR | Status: DC | PRN
  Administered 2017-03-05: 4 mg via INTRAVENOUS

## 2017-03-05 MED ORDER — FENTANYL CITRATE (PF) 100 MCG/2ML IJ SOLN
INTRAMUSCULAR | Status: DC | PRN
  Administered 2017-03-05 (×2): 50 ug via INTRAVENOUS

## 2017-03-05 MED ORDER — BUPIVACAINE-EPINEPHRINE (PF) 0.5% -1:200000 IJ SOLN
INTRAMUSCULAR | Status: AC
  Filled 2017-03-05: qty 30

## 2017-03-05 MED ORDER — ONDANSETRON HCL 4 MG/2ML IJ SOLN: 4.0000 mg | Freq: Four times a day (QID) | INTRAMUSCULAR | Status: DC | PRN

## 2017-03-05 MED ORDER — FENTANYL CITRATE (PF) 100 MCG/2ML IJ SOLN
INTRAMUSCULAR | Status: AC
  Filled 2017-03-05: qty 2

## 2017-03-05 MED ORDER — BUPIVACAINE-EPINEPHRINE (PF) 0.5% -1:200000 IJ SOLN
INTRAMUSCULAR | Status: DC | PRN
  Administered 2017-03-05: 30 mL via PERINEURAL

## 2017-03-05 MED ORDER — FENTANYL CITRATE (PF) 100 MCG/2ML IJ SOLN: 50.0000 ug | INTRAMUSCULAR | Status: DC | PRN

## 2017-03-05 MED ORDER — LACTATED RINGERS IV SOLN
INTRAVENOUS | Status: DC
  Administered 2017-03-05 (×2): via INTRAVENOUS

## 2017-03-05 MED ORDER — BUPIVACAINE-EPINEPHRINE (PF) 0.25% -1:200000 IJ SOLN
INTRAMUSCULAR | Status: AC
  Filled 2017-03-05: qty 30

## 2017-03-05 MED ORDER — METHOCARBAMOL 500 MG PO TABS: 500.0000 mg | ORAL_TABLET | Freq: Four times a day (QID) | ORAL | 0 refills | Status: DC | PRN

## 2017-03-05 MED ORDER — DOCUSATE SODIUM 100 MG PO CAPS: 100.0000 mg | ORAL_CAPSULE | Freq: Two times a day (BID) | ORAL | 0 refills | Status: DC

## 2017-03-05 MED ORDER — ACETAMINOPHEN 500 MG PO TABS
ORAL_TABLET | ORAL | Status: AC
  Filled 2017-03-05: qty 2

## 2017-03-05 MED ORDER — MIDAZOLAM HCL 2 MG/2ML IJ SOLN: 1.0000 mg | INTRAMUSCULAR | Status: DC | PRN

## 2017-03-05 MED ORDER — PROPOFOL 10 MG/ML IV BOLUS
INTRAVENOUS | Status: AC
  Filled 2017-03-05: qty 20

## 2017-03-05 MED ORDER — MIDAZOLAM HCL 5 MG/5ML IJ SOLN
INTRAMUSCULAR | Status: DC | PRN
  Administered 2017-03-05: 2 mg via INTRAVENOUS

## 2017-03-05 MED ORDER — LACTATED RINGERS IV SOLN: INTRAVENOUS | Status: DC

## 2017-03-05 MED ORDER — MIDAZOLAM HCL 2 MG/2ML IJ SOLN
INTRAMUSCULAR | Status: AC
  Filled 2017-03-05: qty 2

## 2017-03-05 MED ORDER — LACTATED RINGERS IV SOLN
INTRAVENOUS | Status: DC | PRN
  Administered 2017-03-05: 12:00:00 via INTRAVENOUS

## 2017-03-05 MED ORDER — FENTANYL CITRATE (PF) 100 MCG/2ML IJ SOLN
25.0000 ug | INTRAMUSCULAR | Status: DC | PRN
  Administered 2017-03-05 (×2): 25 ug via INTRAVENOUS

## 2017-03-05 SURGICAL SUPPLY — 22 items
BANDAGE ADH SHEER 1  50/CT (GAUZE/BANDAGES/DRESSINGS) ×3 IMPLANT
DECANTER SPIKE VIAL GLASS SM (MISCELLANEOUS) IMPLANT
GAUZE SPONGE 4X4 12PLY STRL LF (GAUZE/BANDAGES/DRESSINGS) IMPLANT
GLOVE BIO SURGEON STRL SZ7.5 (GLOVE) ×1 IMPLANT
GLOVE BIOGEL PI IND STRL 8 (GLOVE) ×1 IMPLANT
GLOVE BIOGEL PI INDICATOR 8 (GLOVE)
GOWN STRL REUS W/ TWL LRG LVL3 (GOWN DISPOSABLE) ×1 IMPLANT
GOWN STRL REUS W/ TWL XL LVL3 (GOWN DISPOSABLE) ×2 IMPLANT
GOWN STRL REUS W/TWL LRG LVL3 (GOWN DISPOSABLE)
GOWN STRL REUS W/TWL XL LVL3 (GOWN DISPOSABLE)
KNEE WRAP E Z 3 GEL PACK (MISCELLANEOUS) IMPLANT
NDL SAFETY ECLIPSE 18X1.5 (NEEDLE) ×1 IMPLANT
NDL SPNL 18GX3.5 QUINCKE PK (NEEDLE) IMPLANT
NDL SPNL 22GX3.5 QUINCKE BK (NEEDLE) IMPLANT
NEEDLE HYPO 18GX1.5 SHARP (NEEDLE) ×3
NEEDLE HYPO 22GX1.5 SAFETY (NEEDLE) ×2 IMPLANT
NEEDLE SPNL 18GX3.5 QUINCKE PK (NEEDLE) IMPLANT
NEEDLE SPNL 22GX3.5 QUINCKE BK (NEEDLE) IMPLANT
PAD ALCOHOL SWAB (MISCELLANEOUS) ×6 IMPLANT
SWABSTICK POVIDONE IODINE SNGL (MISCELLANEOUS) IMPLANT
SYR 20CC LL (SYRINGE) IMPLANT
SYR CONTROL 10ML LL (SYRINGE) ×2 IMPLANT

## 2017-03-05 NOTE — Discharge Instructions (Signed)
Diet: As you were doing prior to hospitalization    Activity:  Increase activity slowly as tolerated, but follow the weight bearing instructions below.  The rules on driving is that you can not be taking narcotics while you drive, and you must feel in control of the vehicle.    Weight Bearing:  As tolerated.  Attend Physical Therapy as directed.  To prevent constipation: you may use a stool softener such as -  Colace (over the counter) 100 mg by mouth twice a day  Drink plenty of fluids (prune juice may be helpful) and high fiber foods Miralax (over the counter) for constipation as needed.    Itching:  If you experience itching with your medications, try taking only a single pain pill, or even half a pain pill at a time.  You can also use benadryl over the counter for itching or also to help with sleep.   Precautions:  If you experience chest pain or shortness of breath - call 911 immediately for transfer to the hospital emergency department!!  If you develop a fever greater that 101 F, purulent drainage from wound, increased redness or drainage from wound, or calf pain -- Call the office at [phone]                                                 Follow- Up Appointment:  [facility] [phone]   Post Anesthesia Home Care Instructions  Activity: Get plenty of rest for the remainder of the day. A responsible individual must stay with you for 24 hours following the procedure.  For the next 24 hours, DO NOT: -Drive a car -Advertising copywriterperate machinery -Drink alcoholic beverages -Take any medication unless instructed by your physician -Make any legal decisions or sign important papers.  Meals: Start with liquid foods such as gelatin or soup. Progress to regular foods as tolerated. Avoid greasy, spicy, heavy foods. If nausea and/or vomiting occur, drink only clear liquids until the nausea and/or vomiting subsides. Call your physician if vomiting  continues.  Special Instructions/Symptoms: Your throat may feel dry or sore from the anesthesia or the breathing tube placed in your throat during surgery. If this causes discomfort, gargle with warm salt water. The discomfort should disappear within 24 hours.  If you had a scopolamine patch placed behind your ear for the management of post- operative nausea and/or vomiting:  1. The medication in the patch is effective for 72 hours, after which it should be removed.  Wrap patch in a tissue and discard in the trash. Wash hands thoroughly with soap and water. 2. You may remove the patch earlier than 72 hours if you experience unpleasant side effects which may include dry mouth, dizziness or visual disturbances. 3. Avoid touching the patch. Wash your hands with soap and water after contact with the patch.  Regional Anesthesia Blocks  1. Numbness or the inability to move the "blocked" extremity may last from 3-48 hours after placement. The length of time depends on the medication injected and your individual response to the medication. If the numbness is not going away after 48 hours, call your surgeon.  2. The extremity that is blocked will need to be protected until the numbness is gone and the  Strength has returned. Because you cannot feel it, you will  need to take extra care to avoid injury. Because it may be weak, you may have difficulty moving it or using it. You may not know what position it is in without looking at it while the block is in effect.  3. For blocks in the legs and feet, returning to weight bearing and walking needs to be done carefully. You will need to wait until the numbness is entirely gone and the strength has returned. You should be able to move your leg and foot normally before you try and bear weight or walk. You will need someone to be with you when you first try to ensure you do not fall and possibly risk injury.  4. Bruising and tenderness at the needle site are common  side effects and will resolve in a few days.  5. Persistent numbness or new problems with movement should be communicated to the surgeon or the [facility] [phone])/ [facility] [phone]).

## 2017-03-05 NOTE — Anesthesia Preprocedure Evaluation (Signed)
Anesthesia Evaluation  Patient identified by MRN, date of birth, ID band Patient awake    Reviewed: Allergy & Precautions, H&P , NPO status , Patient's Chart, lab work & pertinent test results  Airway Mallampati: II   Neck ROM: full    Dental   Pulmonary former smoker,    breath sounds clear to auscultation       Cardiovascular negative cardio ROS   Rhythm:regular Rate:Normal     Neuro/Psych  Headaches,    GI/Hepatic   Endo/Other    Renal/GU      Musculoskeletal  (+) Arthritis ,   Abdominal   Peds  Hematology   Anesthesia Other Findings   Reproductive/Obstetrics                             Anesthesia Physical Anesthesia Plan  ASA: II  Anesthesia Plan: MAC   Post-op Pain Management:    Induction: Intravenous  PONV Risk Score and Plan: 2 and Ondansetron, Midazolam and Treatment may vary due to age or medical condition  Airway Management Planned: Mask  Additional Equipment:   Intra-op Plan:   Post-operative Plan:   Informed Consent: I have reviewed the patients History and Physical, chart, labs and discussed the procedure including the risks, benefits and alternatives for the proposed anesthesia with the patient or authorized representative who has indicated his/her understanding and acceptance.     Plan Discussed with: CRNA, Anesthesiologist and Surgeon  Anesthesia Plan Comments:         Anesthesia Quick Evaluation

## 2017-03-05 NOTE — Transfer of Care (Signed)
Immediate Anesthesia Transfer of Care Note  Patient: Socorro Kanitz  Procedure(s) Performed: CLOSED MANIPULATION SHOULDER WITH STEROID INJECTION (Right Shoulder)  Patient Location: PACU  Anesthesia Type:MAC  Level of Consciousness: awake, alert  and oriented  Airway & Oxygen Therapy: Patient Spontanous Breathing and Patient connected to face mask oxygen  Post-op Assessment: Report given to RN and Post -op Vital signs reviewed and stable  Post vital signs: Reviewed and stable  Last Vitals:  Vitals:   03/05/17 1227 03/05/17 1230  BP: (!) 116/55 (!) 104/59  Pulse: 74 73  Resp: 14 (!) 8  Temp: 36.6 C   SpO2: 100% 100%    Last Pain:  Vitals:   03/05/17 1227  TempSrc:   PainSc: 0-No pain         Complications: No apparent anesthesia complications

## 2017-03-05 NOTE — Anesthesia Procedure Notes (Signed)
Anesthesia Regional Block: Interscalene brachial plexus block   Pre-Anesthetic Checklist: ,, timeout performed, Correct Patient, Correct Site, Correct Laterality, Correct Procedure, Correct Position, site marked, Risks and benefits discussed,  Surgical consent,  Pre-op evaluation,  At surgeon's request and post-op pain management  Laterality: Right  Prep: chloraprep       Needles:  Injection technique: Single-shot  Needle Type: Echogenic Stimulator Needle     Needle Length: 5cm  Needle Gauge: 22     Additional Needles:   Procedures:, nerve stimulator,,,,,,,   Nerve Stimulator or Paresthesia:  Response: biceps flexion, 0.45 mA,   Additional Responses:   Narrative:  Start time: 03/05/2017 12:52 PM End time: 03/05/2017 1:00 PM Injection made incrementally with aspirations every 5 mL.  Performed by: Personally  Anesthesiologist: Achille RichHodierne, Saveah Bahar, MD  Additional Notes: Functioning IV was confirmed and monitors were applied.  A 50mm 22ga Arrow echogenic stimulator needle was used. Sterile prep and drape,hand hygiene and sterile gloves were used.  Negative aspiration and negative test dose prior to incremental administration of local anesthetic. The patient tolerated the procedure well.  Ultrasound guidance: relevent anatomy identified, needle position confirmed, local anesthetic spread visualized around nerve(s), vascular puncture avoided.  Image printed for medical record.

## 2017-03-05 NOTE — Interval H&P Note (Signed)
History and Physical Interval Note:  03/05/2017 11:56 AM  Christine Roberson  has presented today for surgery, with the diagnosis of FROZEN RIGHT SHOULDER  The various methods of treatment have been discussed with the patient and family. After consideration of risks, benefits and other options for treatment, the patient has consented to  Procedure(s): CLOSED MANIPULATION SHOULDER WITH STEROID INJECTION (Right) as a surgical intervention .  The patient's history has been reviewed, patient examined, no change in status, stable for surgery.  I have reviewed the patient's chart and labs.  Questions were answered to the patient's satisfaction.     Montrail Mehrer D

## 2017-03-05 NOTE — Interval H&P Note (Signed)
History and Physical Interval Note:  03/05/2017 10:23 AM  Christine Roberson  has presented today for surgery, with the diagnosis of FROZEN RIGHT SHOULDER  The various methods of treatment have been discussed with the patient and family. After consideration of risks, benefits and other options for treatment, the patient has consented to  Procedure(s): CLOSED MANIPULATION SHOULDER WITH STEROID INJECTION (Right) as a surgical intervention .  The patient's history has been reviewed, patient examined, no change in status, stable for surgery.  I have reviewed the patient's chart and labs.  Questions were answered to the patient's satisfaction.     Miquan Tandon D   

## 2017-03-05 NOTE — Progress Notes (Signed)
Assisted Dr. Hodierne with right, ultrasound guided, interscalene  block. Side rails up, monitors on throughout procedure. See vital signs in flow sheet. Tolerated Procedure well. 

## 2017-03-05 NOTE — Interval H&P Note (Signed)
History and Physical Interval Note:  03/05/2017 10:23 AM  Christine Roberson  has presented today for surgery, with the diagnosis of FROZEN RIGHT SHOULDER  The various methods of treatment have been discussed with the patient and family. After consideration of risks, benefits and other options for treatment, the patient has consented to  Procedure(s): CLOSED MANIPULATION SHOULDER WITH STEROID INJECTION (Right) as a surgical intervention .  The patient's history has been reviewed, patient examined, no change in status, stable for surgery.  I have reviewed the patient's chart and labs.  Questions were answered to the patient's satisfaction.     Virdie Penning D

## 2017-03-05 NOTE — Op Note (Signed)
03/05/2017  12:24 PM  PATIENT:  Christine Roberson    PRE-OPERATIVE DIAGNOSIS:  FROZEN RIGHT SHOULDER  POST-OPERATIVE DIAGNOSIS:  Same  PROCEDURE:  CLOSED MANIPULATION SHOULDER WITH STEROID INJECTION  SURGEON:  Ayaat Jansma D, MD  ASSISTANT: none  ANESTHESIA:   mac  PREOPERATIVE INDICATIONS:  [name] is a  51 y.o. female with a diagnosis of [place] who failed conservative measures and elected for surgical management.    The risks benefits and alternatives were discussed with the patient preoperatively including but not limited to the risks of infection, bleeding, nerve injury, cardiopulmonary complications, the need for revision surgery, among others, and the patient was willing to proceed.    OPERATIVE PROCEDURE:  Patient was identified in the preoperative holding area and site was marked by me She was transported to the operating theater and placed on the table in supine position taking care to pad all bony prominences.   Her initial range of motion was 80 degrees of forward elevation and 45 degrees of abduction.  I performed a firm manipulation with disruption of scar tissue was able to get her to 180 degrees of forward elevation and 90 degrees of abduction.  She had good internal and external rotation slight limited internal rotation.  I then injected 80 mg of Depo-Medrol into her subacromial and glenohumeral space.  This was done using sterile technique.  She was awoken and taken to the PACU in stable condition  POST OPERATIVE PLAN: mobilize for dvt px, PT tomorrow

## 2017-03-05 NOTE — H&P (Signed)
    ORTHOPAEDIC CONSULTATION  REQUESTING PHYSICIAN: Murphy, Timothy D, MD  Chief Complaint: R frozen shoulder   HPI: Christine Roberson is a 51 y.o. female who complains of  S/p R shoulder scope with worsening ROM over the last month  Past Medical History:  Diagnosis Date  . Arthritis   . Chronic back pain   . DDD (degenerative disc disease), lumbar   . Headache   . MVP (mitral valve prolapse)    Past Surgical History:  Procedure Laterality Date  . ABDOMINAL HYSTERECTOMY    . JOINT REPLACEMENT Right    TKR  . KNEE ARTHROSCOPY Bilateral   . SPINAL CORD STIMULATOR IMPLANT    . SPINAL CORD STIMULATOR REMOVAL    . TONSILLECTOMY     Social History   Socioeconomic History  . Marital status: Widowed    Spouse name: None  . Number of children: None  . Years of education: None  . Highest education level: None  Social Needs  . Financial resource strain: None  . Food insecurity - worry: None  . Food insecurity - inability: None  . Transportation needs - medical: None  . Transportation needs - non-medical: None  Occupational History  . None  Tobacco Use  . Smoking status: Former Smoker    Last attempt to quit: 11/20/2015    Years since quitting: 1.2  . Smokeless tobacco: Never Used  Substance and Sexual Activity  . Alcohol use: No  . Drug use: No  . Sexual activity: None  Other Topics Concern  . None  Social History Narrative  . None   History reviewed. No pertinent family history. Allergies  Allergen Reactions  . Demerol [Meperidine] Hives  . Dilaudid [Hydromorphone] Hives  . Morphine And Related Hives  . Neosporin [Neomycin-Bacitracin Zn-Polymyx]     Blisters skin  . Prednisone     Made skin turn "beet red"  . Vicodin [Hydrocodone-Acetaminophen] Itching  . Vistaril [Hydroxyzine] Itching   Prior to Admission medications   Medication Sig Start Date End Date Taking? Authorizing Provider  amitriptyline (ELAVIL) 25 MG tablet Take 25 mg by mouth at bedtime.    Yes [provider]  estradiol (CLIMARA - DOSED IN MG/24 HR) 0.1 mg/24hr patch Place 0.1 mg onto the skin 2 (two) times a week.    Yes [provider]  magnesium oxide (MAG-OX) 400 MG tablet Take 400 mg by mouth daily.   Yes [provider]  metoprolol succinate (TOPROL-XL) 25 MG 24 hr tablet Take 25 mg by mouth daily.   Yes [provider]  oxyCODONE-acetaminophen (ROXICET) 5-325 MG tablet Take 1-2 tabs po q4-6 hours prn pain 11/22/16  Yes Stanbery, Mary L, PA-C   No results found.  Positive ROS: All other systems have been reviewed and were otherwise negative with the exception of those mentioned in the HPI and as above.  Labs cbc No results for input(s): WBC, HGB, HCT, PLT in the last 72 hours.  Labs inflam No results for input(s): CRP in the last 72 hours.  Invalid input(s): ESR  Labs coag No results for input(s): INR, PTT in the last 72 hours.  Invalid input(s): PT  No results for input(s): NA, K, CL, CO2, GLUCOSE, BUN, CREATININE, CALCIUM in the last 72 hours.  Physical Exam: There were no vitals filed for this visit. General: Alert, no acute distress Cardiovascular: No pedal edema Respiratory: No cyanosis, no use of accessory musculature GI: No organomegaly, abdomen is soft and non-tender Skin: No   lesions in the area of chief complaint other than those listed below in MSK exam.  Neurologic: Sensation intact distally save for the below mentioned MSK exam Psychiatric: Patient is competent for consent with normal mood and affect Lymphatic: No axillary or cervical lymphadenopathy  MUSCULOSKELETAL:  RUE: ROM FE of 80degrees passive = active. Min TTP, no swelling or erythema Other extremities are atraumatic with painless ROM and NVI.  Assessment: R Frozen shoulder  Plan: MUA GH injection PT tomorrow  MURPHY, TIMOTHY D, MD Cell [phone]   03/05/2017 7:35 AM    

## 2017-03-05 NOTE — Anesthesia Procedure Notes (Signed)
Procedure Name: MAC Date/Time: 03/05/2017 12:05 PM Performed by: Genevie Ann, CRNA Pre-anesthesia Checklist: Patient identified, Emergency Drugs available, Suction available, Patient being monitored and Timeout performed Oxygen Delivery Method: Simple face mask

## 2017-03-06 ENCOUNTER — Ambulatory Visit: Payer: Medicare Other | Admitting: Physical Therapy

## 2017-03-06 ENCOUNTER — Encounter (HOSPITAL_BASED_OUTPATIENT_CLINIC_OR_DEPARTMENT_OTHER): Payer: Self-pay | Admitting: Orthopedic Surgery

## 2017-03-06 NOTE — Anesthesia Postprocedure Evaluation (Signed)
Anesthesia Post Note  Patient: Egan Sahlin  Procedure(s) Performed: CLOSED MANIPULATION SHOULDER WITH STEROID INJECTION (Right Shoulder)     Patient location during evaluation: PACU Anesthesia Type: MAC Level of consciousness: awake and alert Pain management: pain level controlled Vital Signs Assessment: post-procedure vital signs reviewed and stable Respiratory status: spontaneous breathing, nonlabored ventilation, respiratory function stable and patient connected to nasal cannula oxygen Cardiovascular status: stable and blood pressure returned to baseline Postop Assessment: no apparent nausea or vomiting Anesthetic complications: no Comments: Interscalene nerve block done in PACU for analgesia.    Last Vitals:  Vitals:   03/05/17 1343 03/05/17 1356  BP: (!) 101/54 (!) 103/50  Pulse: 75 61  Resp: 14 16  Temp:  36.5 C  SpO2: 94% 100%    Last Pain:  Vitals:   03/05/17 1356  TempSrc: Oral  PainSc: 0-No pain                 Nettie Wyffels S

## 2017-03-07 ENCOUNTER — Encounter: Payer: Medicare Other | Admitting: Rehabilitative and Restorative Service Providers"

## 2017-03-08 ENCOUNTER — Encounter: Payer: Medicare Other | Admitting: Physical Therapy

## 2017-05-14 ENCOUNTER — Ambulatory Visit: Payer: Medicare Other | Attending: Orthopedic Surgery | Admitting: Physical Therapy

## 2017-05-14 ENCOUNTER — Other Ambulatory Visit: Payer: Self-pay

## 2017-05-14 ENCOUNTER — Encounter: Payer: Self-pay | Admitting: Physical Therapy

## 2017-05-14 DIAGNOSIS — M6281 Muscle weakness (generalized): Secondary | ICD-10-CM | POA: Insufficient documentation

## 2017-05-14 DIAGNOSIS — G8929 Other chronic pain: Secondary | ICD-10-CM | POA: Diagnosis present

## 2017-05-14 DIAGNOSIS — M25511 Pain in right shoulder: Secondary | ICD-10-CM | POA: Insufficient documentation

## 2017-05-14 DIAGNOSIS — M25512 Pain in left shoulder: Secondary | ICD-10-CM | POA: Insufficient documentation
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
## 2017-05-17 ENCOUNTER — Encounter: Payer: Self-pay | Admitting: Physical Therapy

## 2017-05-17 ENCOUNTER — Ambulatory Visit: Payer: Medicare Other | Admitting: Physical Therapy

## 2017-05-17 DIAGNOSIS — M25511 Pain in right shoulder: Secondary | ICD-10-CM | POA: Diagnosis not present

## 2017-05-17 DIAGNOSIS — M6281 Muscle weakness (generalized): Secondary | ICD-10-CM

## 2017-05-17 DIAGNOSIS — M25512 Pain in left shoulder: Secondary | ICD-10-CM

## 2017-05-17 DIAGNOSIS — G8929 Other chronic pain: Secondary | ICD-10-CM

## 2017-05-17 NOTE — Therapy (Addendum)
[facility]patient Rehabilitation [facility] [address] [place], [place], [address] Phone: [phone]   Fax:  [phone]  Physical Therapy Treatment  Patient Details  Name: Christine Roberson MRN: [id number] Date of Birth: April 02, 1966 Referring Provider: Margarita Rana, MD   Encounter Date: 05/17/2017  PT End of Session - 05/17/17 1043    Visit Number  2    Number of Visits  13    Date for PT Re-Evaluation  06/28/17    Authorization Type  [employer] MCR    PT Start Time  1040 pt arrived late     PT Stop Time  1103    PT Time Calculation (min)  23 min       Past Medical History:  Diagnosis Date  . Arthritis   . Chronic back pain   . DDD (degenerative disc disease), lumbar   . Headache   . MVP (mitral valve prolapse)     Past Surgical History:  Procedure Laterality Date  . ABDOMINAL HYSTERECTOMY    . CLOSED MANIPULATION SHOULDER WITH STERIOD INJECTION Right 03/05/2017   Procedure: CLOSED MANIPULATION SHOULDER WITH STEROID INJECTION;  Surgeon: Sheral Apley, MD;  Location: [facility];  Service: Orthopedics;  Laterality: Right;  . JOINT REPLACEMENT Right    TKR  . KNEE ARTHROSCOPY Bilateral   . SPINAL CORD STIMULATOR IMPLANT    . SPINAL CORD STIMULATOR REMOVAL    . TONSILLECTOMY      There were no vitals filed for this visit.  Subjective Assessment - 05/17/17 1042    Subjective  Sore after exercises. Right more pain in shoulder blade. Left more front and top of shoulder.     Currently in Pain?  Yes    Pain Score  2     Pain Location  Shoulder    Pain Orientation  Right;Left    Pain Descriptors / Indicators  Sore                      [facility] Adult PT Treatment/Exercise - 05/17/17 0001      Shoulder Exercises: Standing   Protraction Limitations  ABCs ball on wall rest breaks required    External Rotation  Both;20 reps;Theraband red    Internal Rotation  20 reps;Theraband red    Extension  20 reps red    Row  20 reps red     Other Standing Exercises  lateral wall steps red tband    Other Standing Exercises  serratus wall slides       Shoulder Exercises: Pulleys   Flexion  2 minutes                  PT Long Term Goals - 05/14/17 1528      PT LONG TERM GOAL #1   Title  Pt will be able to empty dishwasher without limitation by shoulder discomfort/weakness    Baseline  limited in lifting dishes to higher cabinets    Time  6    Period  Weeks    Status  New    Target Date  06/28/17      PT LONG TERM GOAL #2   Title  Will be able to carry a bag of groceries from store to car without switching hands    Baseline  switches hands with any kind of weight    Time  6    Period  Weeks    Status  New    Target Date  06/28/17  PT LONG TERM GOAL #3   Title  Left-sided neck pain to <=2/10 with activities    Baseline  feels like it wraps around shoulder and limits activity    Time  6    Period  Weeks    Status  New    Target Date  06/28/17      PT LONG TERM GOAL #4   Title  FOTO to 35% limited to indicate significant improvement in functional ability    Baseline  41% limited at eval    Time  6    Period  Weeks    Status  New    Target Date  06/28/17            Plan - 05/17/17 1048    Clinical Impression Statement  Short treatment due to patient arriving late. Quick Fatigue with HEP exercises. Focused strengthening. C/o fatigue, no increased pain.     PT Next Visit Plan  quadruped UE exercises, endurance challenges    PT Home Exercise Plan  wall walks, serratus pull out on wall, ball on wall ABCs    Consulted and Agree with Plan of Care  Patient       Patient will benefit from skilled therapeutic intervention in order to improve the following deficits and impairments:  Improper body mechanics, Pain, Postural dysfunction, Increased muscle spasms, Decreased activity tolerance, Decreased endurance, Decreased strength, Impaired UE functional use  Visit Diagnosis: Chronic right  shoulder pain  Chronic left shoulder pain  Muscle weakness (generalized)     Problem List There are no active problems to display for this patient.   Sherrie MustacheDonoho, Paysen Goza McGee , [place] 05/17/2017, 11:03 AM  [facility] [address] [place], [place], [address] Phone: [phone]   Fax:  [phone]  Name: Christine Roberson MRN: [id number] Date of Birth: 07/13/1965

## 2017-05-20 ENCOUNTER — Ambulatory Visit: Payer: Medicare Other | Admitting: Physical Therapy

## 2017-05-22 ENCOUNTER — Encounter: Payer: Medicare Other | Admitting: Physical Therapy

## 2017-05-27 ENCOUNTER — Ambulatory Visit: Payer: Medicare Other | Admitting: Physical Therapy

## 2017-05-27 ENCOUNTER — Encounter: Payer: Self-pay | Admitting: Physical Therapy

## 2017-05-27 DIAGNOSIS — M25511 Pain in right shoulder: Secondary | ICD-10-CM | POA: Diagnosis not present

## 2017-05-27 DIAGNOSIS — G8929 Other chronic pain: Secondary | ICD-10-CM

## 2017-05-27 DIAGNOSIS — M6281 Muscle weakness (generalized): Secondary | ICD-10-CM

## 2017-05-27 DIAGNOSIS — M25512 Pain in left shoulder: Secondary | ICD-10-CM

## 2017-05-27 NOTE — Therapy (Signed)
[facility] [address] [place], [place], [address] Phone: [phone]   Fax:  [phone]  Physical Therapy Treatment  Patient Details  Name: Christine Roberson MRN: [id number] Date of Birth: 1965/08/13 Referring Provider: Margarita Rana, MD   Encounter Date: 05/27/2017  PT End of Session - 05/27/17 1340    Visit Number  3    Number of Visits  13    Date for PT Re-Evaluation  06/28/17    Authorization Type  [employer] MCR    PT Start Time  1345    PT Stop Time  1440    PT Time Calculation (min)  55 min    Activity Tolerance  Patient tolerated treatment well    Behavior During Therapy  [facility] for tasks assessed/performed       Past Medical History:  Diagnosis Date  . Arthritis   . Chronic back pain   . DDD (degenerative disc disease), lumbar   . Headache   . MVP (mitral valve prolapse)     Past Surgical History:  Procedure Laterality Date  . ABDOMINAL HYSTERECTOMY    . CLOSED MANIPULATION SHOULDER WITH STERIOD INJECTION Right 03/05/2017   Procedure: CLOSED MANIPULATION SHOULDER WITH STEROID INJECTION;  Surgeon: Sheral Apley, MD;  Location: [facility];  Service: Orthopedics;  Laterality: Right;  . JOINT REPLACEMENT Right    TKR  . KNEE ARTHROSCOPY Bilateral   . SPINAL CORD STIMULATOR IMPLANT    . SPINAL CORD STIMULATOR REMOVAL    . TONSILLECTOMY      There were no vitals filed for this visit.  Subjective Assessment - 05/27/17 1347    Subjective  Both shoulders are sore and achey. I don't think the weather is helping. I am trying to do things but they get tired very quickly.     Currently in Pain?  Yes    Pain Score  3     Pain Location  Shoulder    Pain Orientation  Right;Left    Pain Descriptors / Indicators  Sore                      [facility] Adult PT Treatment/Exercise - 05/27/17 0001      Shoulder Exercises: ROM/Strengthening   UBE (Upper Arm Bike)  3'/3' L1    Other ROM/Strengthening  Exercises  UE ranger flexion bilat, 36      Modalities   Modalities  Moist Heat      Moist Heat Therapy   Number Minutes Moist Heat  10 Minutes    Moist Heat Location  Shoulder bilat      Manual Therapy   Manual Therapy  Joint mobilization;Soft tissue mobilization    Joint Mobilization  bil GHJ AP at 90 abd, inf at end range abd, sup to inf at end range flexion; scap distraction in sidelying    Soft tissue mobilization  bil subscap in supine & sidelying                  PT Long Term Goals - 05/14/17 1528      PT LONG TERM GOAL #1   Title  Pt will be able to empty dishwasher without limitation by shoulder discomfort/weakness    Baseline  limited in lifting dishes to higher cabinets    Time  6    Period  Weeks    Status  New    Target Date  06/28/17      PT LONG TERM GOAL #  2   Title  Will be able to carry a bag of groceries from store to car without switching hands    Baseline  switches hands with any kind of weight    Time  6    Period  Weeks    Status  New    Target Date  06/28/17      PT LONG TERM GOAL #3   Title  Left-sided neck pain to <=2/10 with activities    Baseline  feels like it wraps around shoulder and limits activity    Time  6    Period  Weeks    Status  New    Target Date  06/28/17      PT LONG TERM GOAL #4   Title  FOTO to 35% limited to indicate significant improvement in functional ability    Baseline  41% limited at eval    Time  6    Period  Weeks    Status  New    Target Date  06/28/17            Plan - 05/27/17 1432    Clinical Impression Statement  Concordant pain found in bil subscap musculature. Notable lack of scapular mobility on rib cage creating painful end range motions. Discussed moving shoulder to a point of fatigue but not into pain.     PT Treatment/Interventions  ADLs/Self Care Home Management;Cryotherapy;Electrical Stimulation;Ultrasound;Traction;Moist Heat;Iontophoresis 4mg /ml Dexamethasone;Functional mobility  training;Therapeutic activities;Therapeutic exercise;Patient/family education;Neuromuscular re-education;Manual techniques;Passive range of motion;Vasopneumatic Device;Taping;Dry needling    PT Next Visit Plan  quadruped UE exercises, long arc motions overhead    PT Home Exercise Plan  wall walks, serratus pull out on wall, ball on wall ABCs    Consulted and Agree with Plan of Care  Patient       Patient will benefit from skilled therapeutic intervention in order to improve the following deficits and impairments:  Improper body mechanics, Pain, Postural dysfunction, Increased muscle spasms, Decreased activity tolerance, Decreased endurance, Decreased strength, Impaired UE functional use  Visit Diagnosis: Chronic right shoulder pain  Chronic left shoulder pain  Muscle weakness (generalized)     Problem List There are no active problems to display for this patient.  Hobie Kohles C. Mauricio Dahlen PT, DPT 05/27/17 2:33 PM   [facility] [address] [place], [place], [address] Phone: [phone]   Fax:  [phone]  Name: Christine Roberson MRN: [id number] Date of Birth: 1965/05/23

## 2017-05-29 ENCOUNTER — Ambulatory Visit: Payer: Medicare Other | Admitting: Physical Therapy

## 2017-05-29 ENCOUNTER — Encounter: Payer: Self-pay | Admitting: Physical Therapy

## 2017-05-29 DIAGNOSIS — M25512 Pain in left shoulder: Secondary | ICD-10-CM

## 2017-05-29 DIAGNOSIS — G8929 Other chronic pain: Secondary | ICD-10-CM

## 2017-05-29 DIAGNOSIS — M25511 Pain in right shoulder: Secondary | ICD-10-CM | POA: Diagnosis not present

## 2017-05-29 DIAGNOSIS — M6281 Muscle weakness (generalized): Secondary | ICD-10-CM

## 2017-05-29 NOTE — Patient Instructions (Signed)
Over Head Pull: Narrow Grip       On back, knees bent, feet flat, band across thighs, elbows straight but relaxed. Pull hands apart (start). Keeping elbows straight, bring arms up and over head, hands toward floor. Keep pull steady on band. Hold momentarily. Return slowly, keeping pull steady, back to start. Repeat _15__ times. Band color __R____   Side Pull: Double Arm   On back, knees bent, feet flat. Arms perpendicular to body, shoulder level, elbows straight but relaxed. Pull arms out to sides, elbows straight. Resistance band comes across collarbones, hands toward floor. Hold momentarily. Slowly return to starting position. Repeat _15__ times. Band color ___R__   Sash   On back, knees bent, feet flat, left hand on left hip, right hand above left. Pull right arm DIAGONALLY (hip to shoulder) across chest. Bring right arm along head toward floor. Hold momentarily. Slowly return to starting position. Repeat _15__ times. Do with left arm. Band color ___R___   Shoulder Rotation: Double Arm   On back, knees bent, feet flat, elbows tucked at sides, bent 90, hands palms up. Pull hands apart and down toward floor, keeping elbows near sides. Hold momentarily. Slowly return to starting position. Repeat _15__ times. Band color ___R___   

## 2017-05-29 NOTE — Therapy (Signed)
[facility] Out[facility] [address] [place], [place], [address] Phone: [phone]   Fax:  [phone]  Physical Therapy Treatment  Patient Details  Name: Mizani Dilday MRN: [id number] Date of Birth: 1965-04-25 Referring Provider: Margarita Rana, MD   Encounter Date: 05/29/2017  PT End of Session - 05/29/17 1119    Visit Number  4    Number of Visits  13    Date for PT Re-Evaluation  06/28/17    Authorization Type  [employer]HC MCR    PT Start Time  1100    PT Stop Time  1138    PT Time Calculation (min)  38 min       Past Medical History:  Diagnosis Date  . Arthritis   . Chronic back pain   . DDD (degenerative disc disease), lumbar   . Headache   . MVP (mitral valve prolapse)     Past Surgical History:  Procedure Laterality Date  . ABDOMINAL HYSTERECTOMY    . CLOSED MANIPULATION SHOULDER WITH STERIOD INJECTION Right 03/05/2017   Procedure: CLOSED MANIPULATION SHOULDER WITH STEROID INJECTION;  Surgeon: Sheral Apley, MD;  Location: [facility];  Service: Orthopedics;  Laterality: Right;  . JOINT REPLACEMENT Right    TKR  . KNEE ARTHROSCOPY Bilateral   . SPINAL CORD STIMULATOR IMPLANT    . SPINAL CORD STIMULATOR REMOVAL    . TONSILLECTOMY      There were no vitals filed for this visit.  Subjective Assessment - 05/29/17 1116    Subjective  Shoulder blades 3/10 , sore after last treatment.     Currently in Pain?  Yes    Pain Score  3                       [facility] Adult PT Treatment/Exercise - 05/29/17 0001      Shoulder Exercises: Supine   Other Supine Exercises   Supine red band scap stabilization series x 15 each       Shoulder Exercises: Pulleys   Flexion  2 minutes    ABduction  2 minutes      Shoulder Exercises: ROM/Strengthening   UBE (Upper Arm Bike)  3'/3' L1.5    Other ROM/Strengthening Exercises  Quadruped with pillow under knees for comfort, alternating UE raises with cues for core  control, neutral spine, also chin tuck x 10       Manual Therapy   Soft tissue mobilization  bilateral upper traps with side bend and levator passive stretching             PT Education - 05/29/17 1140    Education provided  Yes    Education Details  HEP    Person(s) Educated  Patient    Methods  Explanation;Handout    Comprehension  Verbalized understanding          PT Long Term Goals - 05/14/17 1528      PT LONG TERM GOAL #1   Title  Pt will be able to empty dishwasher without limitation by shoulder discomfort/weakness    Baseline  limited in lifting dishes to higher cabinets    Time  6    Period  Weeks    Status  New    Target Date  06/28/17      PT LONG TERM GOAL #2   Title  Will be able to carry a bag of groceries from store to car without switching hands    Baseline  switches hands with any kind of weight    Time  6    Period  Weeks    Status  New    Target Date  06/28/17      PT LONG TERM GOAL #3   Title  Left-sided neck pain to <=2/10 with activities    Baseline  feels like it wraps around shoulder and limits activity    Time  6    Period  Weeks    Status  New    Target Date  06/28/17      PT LONG TERM GOAL #4   Title  FOTO to 35% limited to indicate significant improvement in functional ability    Baseline  41% limited at eval    Time  6    Period  Weeks    Status  New    Target Date  06/28/17            Plan - 05/29/17 1123    Clinical Impression Statement  Pt reports pain localized to shoulder blades. She reports soreness after manual treatment last visit. Focused supine scapular stabilization and upper trap PROM/soft tissue work. Updated HEP. Pt reports fatigue, no increased pain.     PT Next Visit Plan  quadruped UE exercises, long arc motions overhead    PT Home Exercise Plan  wall walks, serratus pull out on wall, ball on wall ABCs, supine scap stab series red band     Consulted and Agree with Plan of Care  Patient       Patient  will benefit from skilled therapeutic intervention in order to improve the following deficits and impairments:  Improper body mechanics, Pain, Postural dysfunction, Increased muscle spasms, Decreased activity tolerance, Decreased endurance, Decreased strength, Impaired UE functional use  Visit Diagnosis: Chronic right shoulder pain  Chronic left shoulder pain  Muscle weakness (generalized)     Problem List There are no active problems to display for this patient.   Sherrie MustacheDonoho, Khaleel Beckom McGee, [place] 05/29/2017, 11:43 AM  [facility] [address] [place], [place], [address] Phone: [phone]   Fax:  [phone]  Name: Lisette GrinderDenise Ruelas MRN: [id number] Date of Birth: [birth year]

## 2017-06-03 ENCOUNTER — Encounter: Payer: Self-pay | Admitting: Physical Therapy

## 2017-06-03 ENCOUNTER — Ambulatory Visit: Payer: Medicare Other | Attending: Orthopedic Surgery | Admitting: Physical Therapy

## 2017-06-03 DIAGNOSIS — G8929 Other chronic pain: Secondary | ICD-10-CM | POA: Insufficient documentation

## 2017-06-03 DIAGNOSIS — M25512 Pain in left shoulder: Secondary | ICD-10-CM | POA: Diagnosis present

## 2017-06-03 DIAGNOSIS — M25511 Pain in right shoulder: Secondary | ICD-10-CM | POA: Diagnosis present

## 2017-06-03 DIAGNOSIS — M6281 Muscle weakness (generalized): Secondary | ICD-10-CM | POA: Diagnosis present

## 2017-06-03 NOTE — Therapy (Signed)
[facility] [address] [place], [place], [address] Phone: [phone]   Fax:  [phone]  Physical Therapy Treatment  Patient Details  Name: Christine Roberson MRN: [id number] Date of Birth: Nov 14, 1965 Referring Provider: Margarita Ranaimothy Murphy, MD   Encounter Date: 06/03/2017  PT End of Session - 06/03/17 1416    Visit Number  5    Number of Visits  13    Date for PT Re-Evaluation  06/28/17    PT Start Time  1415    PT Stop Time  1505    PT Time Calculation (min)  50 min    Activity Tolerance  Patient tolerated treatment well    Behavior During Therapy  [facility] for tasks assessed/performed       Past Medical History:  Diagnosis Date  . Arthritis   . Chronic back pain   . DDD (degenerative disc disease), lumbar   . Headache   . MVP (mitral valve prolapse)     Past Surgical History:  Procedure Laterality Date  . ABDOMINAL HYSTERECTOMY    . CLOSED MANIPULATION SHOULDER WITH STERIOD INJECTION Right 03/05/2017   Procedure: CLOSED MANIPULATION SHOULDER WITH STEROID INJECTION;  Surgeon: Sheral ApleyMurphy, Timothy D, MD;  [facility];  Service: Orthopedics;  Laterality: Right;  . JOINT REPLACEMENT Right    TKR  . KNEE ARTHROSCOPY Bilateral   . SPINAL CORD STIMULATOR IMPLANT    . SPINAL CORD STIMULATOR REMOVAL    . TONSILLECTOMY      There were no vitals filed for this visit.  Subjective Assessment - 06/03/17 1418    Subjective  Pt states she feels horrible today, her shoulder is really sore. 5/10 pain more in the neck than the shoulder on the L side. reports having trouble sleeping on Friday night and throughout the weekend.     Currently in Pain?  Yes    Pain Score  5     Pain Location  Neck    Pain Orientation  Left;Anterior    Pain Descriptors / Indicators  Sharp;Burning                      [facility] Adult PT Treatment/Exercise - 06/03/17 0001      Shoulder Exercises: Prone   Other Prone Exercises   Prone T 2x10 bilaterally      Shoulder Exercises: Standing   Row  15 reps;Theraband red      Shoulder Exercises: ROM/Strengthening   UBE (Upper Arm Bike)  5' retro L1    Other ROM/Strengthening Exercises  Quadraped CKC UE circles with ball bilaterally      Shoulder Exercises: Stretch   Other Shoulder Stretches  L scalenes stretch 3x30"      Moist Heat Therapy   Number Minutes Moist Heat  10 Minutes    Moist Heat Location  Shoulder;Cervical      Manual Therapy   Soft tissue mobilization  L SCM/scalenes                  PT Long Term Goals - 05/14/17 1528      PT LONG TERM GOAL #1   Title  Pt will be able to empty dishwasher without limitation by shoulder discomfort/weakness    Baseline  limited in lifting dishes to higher cabinets    Time  6    Period  Weeks    Status  New    Target Date  06/28/17      PT LONG TERM  GOAL #2   Title  Will be able to carry a bag of groceries from store to car without switching hands    Baseline  switches hands with any kind of weight    Time  6    Period  Weeks    Status  New    Target Date  06/28/17      PT LONG TERM GOAL #3   Title  Left-sided neck pain to <=2/10 with activities    Baseline  feels like it wraps around shoulder and limits activity    Time  6    Period  Weeks    Status  New    Target Date  06/28/17      PT LONG TERM GOAL #4   Title  FOTO to 35% limited to indicate significant improvement in functional ability    Baseline  41% limited at eval    Time  6    Period  Weeks    Status  New    Target Date  06/28/17            Plan - 06/03/17 1456    Clinical Impression Statement  Pt reports pain around L clavicle, with a burning sensation. Trigger point noted in scalenes, soft tissue massage helped to decrease pain/burning sensation. Minimal cuing required to decrease upper trap and scalene activation during exercises. Pt with continued instability noted in periscapular region with poor OKC control  resulting in pain in overhead motions.     PT Treatment/Interventions  ADLs/Self Care Home Management;Cryotherapy;Electrical Stimulation;Ultrasound;Traction;Moist Heat;Iontophoresis 4mg /ml Dexamethasone;Functional mobility training;Therapeutic activities;Therapeutic exercise;Patient/family education;Neuromuscular re-education;Manual techniques;Passive range of motion;Vasopneumatic Device;Taping;Dry needling    PT Next Visit Plan  quadruped UE exercises, long arc motions overhead, UE stabilization exercises    PT Home Exercise Plan  wall walks, serratus pull out on wall, ball on wall ABCs, supine scap stab series red band     Consulted and Agree with Plan of Care  Patient       Patient will benefit from skilled therapeutic intervention in order to improve the following deficits and impairments:  Improper body mechanics, Pain, Postural dysfunction, Increased muscle spasms, Decreased activity tolerance, Decreased endurance, Decreased strength, Impaired UE functional use  Visit Diagnosis: Chronic right shoulder pain  Chronic left shoulder pain  Muscle weakness (generalized)     Problem List There are no active problems to display for this patient.   Avir Deruiter C. Giulia Hickey PT, DPT 06/03/17 3:30 PM   [facility] [address] [place], [place], [address] Phone: [phone]   Fax:  [phone]  Name: Christine Roberson MRN: [id number] Date of Birth: 28-Oct-1965

## 2017-06-05 ENCOUNTER — Encounter: Payer: Self-pay | Admitting: Physical Therapy

## 2017-06-05 ENCOUNTER — Ambulatory Visit: Payer: Medicare Other | Admitting: Physical Therapy

## 2017-06-05 DIAGNOSIS — M25511 Pain in right shoulder: Principal | ICD-10-CM

## 2017-06-05 DIAGNOSIS — M25512 Pain in left shoulder: Secondary | ICD-10-CM

## 2017-06-05 DIAGNOSIS — M6281 Muscle weakness (generalized): Secondary | ICD-10-CM

## 2017-06-05 DIAGNOSIS — G8929 Other chronic pain: Secondary | ICD-10-CM

## 2017-06-05 NOTE — Therapy (Signed)
[facility] Outpatient Rehabilitation [facility] [address] [place], [place], [address] Phone: [phone]   Fax:  [phone]  Physical Therapy Treatment  Patient Details  Name: Christine Roberson MRN: [id number] Date of Birth: [birth year] Referring Provider: Margarita Rana, MD   Encounter Date: 06/05/2017  PT End of Session - 06/05/17 1305    Visit Number  6    Number of Visits  13    Date for PT Re-Evaluation  06/28/17    Authorization Type  UHC MCR    PT Start Time  1257    PT Stop Time  1345    PT Time Calculation (min)  48 min       Past Medical History:  Diagnosis Date  . Arthritis   . Chronic back pain   . DDD (degenerative disc disease), lumbar   . Headache   . MVP (mitral valve prolapse)     Past Surgical History:  Procedure Laterality Date  . ABDOMINAL HYSTERECTOMY    . CLOSED MANIPULATION SHOULDER WITH STERIOD INJECTION Right 03/05/2017   Procedure: CLOSED MANIPULATION SHOULDER WITH STEROID INJECTION;  Surgeon: Sheral Apley, MD;  Location: [facility];  Service: Orthopedics;  Laterality: Right;  . JOINT REPLACEMENT Right    TKR  . KNEE ARTHROSCOPY Bilateral   . SPINAL CORD STIMULATOR IMPLANT    . SPINAL CORD STIMULATOR REMOVAL    . TONSILLECTOMY      There were no vitals filed for this visit.  Subjective Assessment - 06/05/17 1304    Subjective  Neck pain2/10, still burns at my clavicle.     Currently in Pain?  Yes    Pain Score  2     Pain Location  Neck    Pain Orientation  Left    Pain Descriptors / Indicators  Burning    Aggravating Factors   lift and carry, prolonged activity.     Pain Relieving Factors  scalenes massage                       [facility] Adult PT Treatment/Exercise - 06/05/17 0001      Shoulder Exercises: Supine   Other Supine Exercises   Supine red band scap stabilization series x 15 each       Shoulder Exercises: Prone   Other Prone Exercises  Prone T 2x10 bilaterally      Shoulder Exercises: Standing   Row  20 reps red    Other Standing Exercises  lateral wall steps red tband    Other Standing Exercises  ball on wall flexion circles x 1 minutes each       Shoulder Exercises: Pulleys   Flexion  2 minutes    ABduction  2 minutes      Shoulder Exercises: ROM/Strengthening   UBE (Upper Arm Bike)  3'/3' L1.5    Other ROM/Strengthening Exercises  Quadraped CKC UE circles with ball bilaterally    Other ROM/Strengthening Exercises  Quadruped with pillow under knees for comfort, alternating UE raises with cues for core control, neutral spine, also chin tuck x 10       Shoulder Exercises: Stretch   Other Shoulder Stretches  L scalenes stretch 3x30"                  PT Long Term Goals - 05/14/17 1528      PT LONG TERM GOAL #1   Title  Pt will be able to empty dishwasher without limitation by shoulder discomfort/weakness  Baseline  limited in lifting dishes to higher cabinets    Time  6    Period  Weeks    Status  New    Target Date  06/28/17      PT LONG TERM GOAL #2   Title  Will be able to carry a bag of groceries from store to car without switching hands    Baseline  switches hands with any kind of weight    Time  6    Period  Weeks    Status  New    Target Date  06/28/17      PT LONG TERM GOAL #3   Title  Left-sided neck pain to <=2/10 with activities    Baseline  feels like it wraps around shoulder and limits activity    Time  6    Period  Weeks    Status  New    Target Date  06/28/17      PT LONG TERM GOAL #4   Title  FOTO to 35% limited to indicate significant improvement in functional ability    Baseline  41% limited at eval    Time  6    Period  Weeks    Status  New    Target Date  06/28/17            Plan - 06/05/17 1339    Clinical Impression Statement  Pt reports pain at clavicle is better and the scalenes stretch has been very helpful. She continues to fatigue quickly with exercises and requires rest breaks.      PT Next Visit Plan  quadruped UE exercises, long arc motions overhead, UE stabilization exercises    PT Home Exercise Plan  wall walks, serratus pull out on wall, ball on wall ABCs, supine scap stab series red band     Consulted and Agree with Plan of Care  Patient       Patient will benefit from skilled therapeutic intervention in order to improve the following deficits and impairments:  Improper body mechanics, Pain, Postural dysfunction, Increased muscle spasms, Decreased activity tolerance, Decreased endurance, Decreased strength, Impaired UE functional use  Visit Diagnosis: Chronic right shoulder pain  Chronic left shoulder pain  Muscle weakness (generalized)     Problem List There are no active problems to display for this patient.   Sherrie MustacheDonoho, Zeferino Mounts McGee, [place]PTA 06/05/2017, 1:44 PM  [facility] [address] [place], [place], [address] Phone: [phone]   Fax:  [phone]  Name: Christine Roberson MRN: [id number] Date of Birth: 1965/08/18

## 2017-06-10 ENCOUNTER — Ambulatory Visit: Payer: Medicare Other | Admitting: Physical Therapy

## 2017-06-10 ENCOUNTER — Encounter: Payer: Self-pay | Admitting: Physical Therapy

## 2017-06-10 DIAGNOSIS — M25511 Pain in right shoulder: Principal | ICD-10-CM

## 2017-06-10 DIAGNOSIS — G8929 Other chronic pain: Secondary | ICD-10-CM

## 2017-06-10 DIAGNOSIS — M6281 Muscle weakness (generalized): Secondary | ICD-10-CM

## 2017-06-10 DIAGNOSIS — M25512 Pain in left shoulder: Secondary | ICD-10-CM

## 2017-06-10 NOTE — Therapy (Signed)
[facility] [address] [place], [place], [address] Phone: [phone]   Fax:  [phone]  Physical Therapy Treatment  Patient Details  Name: [name] MRN: [id number] Date of Birth: 1965/10/13 Referring Provider: Margarita Ranaimothy Murphy, MD   Encounter Date: 06/10/2017  PT End of Session - 06/10/17 1500    Visit Number  7    Number of Visits  13    Date for PT Re-Evaluation  06/28/17    Authorization Type  UHC MCR    PT Start Time  1415    PT Stop Time  1509    PT Time Calculation (min)  54 min    Activity Tolerance  Patient tolerated treatment well    Behavior During Therapy  [facility] for tasks assessed/performed       Past Medical History:  Diagnosis Date  . Arthritis   . Chronic back pain   . DDD (degenerative disc disease), lumbar   . Headache   . MVP (mitral valve prolapse)     Past Surgical History:  Procedure Laterality Date  . ABDOMINAL HYSTERECTOMY    . CLOSED MANIPULATION SHOULDER WITH STERIOD INJECTION Right 03/05/2017   Procedure: CLOSED MANIPULATION SHOULDER WITH STEROID INJECTION;  Surgeon: Sheral ApleyMurphy, Timothy D, MD;  Location: [facility];  Service: Orthopedics;  Laterality: Right;  . JOINT REPLACEMENT Right    TKR  . KNEE ARTHROSCOPY Bilateral   . SPINAL CORD STIMULATOR IMPLANT    . SPINAL CORD STIMULATOR REMOVAL    . TONSILLECTOMY      There were no vitals filed for this visit.  Subjective Assessment - 06/10/17 1415    Subjective  I am sore but overall okay. Soreness mostly around shoulder blades. Felt some burning at Lt clavicle last night but stretches are helpful.     Currently in Pain?  Yes    Pain Score  3     Pain Location  Scapula    Pain Orientation  Right;Left    Pain Descriptors / Indicators  Sore                      [facility] Adult PT Treatment/Exercise - 06/10/17 0001      Shoulder Exercises: Prone   Retraction  12 reps    Extension  12 reps retraction +  extension    Other Prone Exercises  prone IR liftoff      Shoulder Exercises: Standing   Other Standing Exercises  IYT 1# bilat x10      Shoulder Exercises: ROM/Strengthening   UBE (Upper Arm Bike)  4 min retro L1.5      Shoulder Exercises: Body Blade   External Rotation  30 seconds;3 reps both      Moist Heat Therapy   Number Minutes Moist Heat  10 Minutes    Moist Heat Location  Cervical      Manual Therapy   Manual therapy comments  spasm in Rt levator scap during body blade    Joint Mobilization  gr 3 thoracic PA, gr 4 gross rib ER    Soft tissue mobilization  IASTM bil paraspinals                  PT Long Term Goals - 05/14/17 1528      PT LONG TERM GOAL #1   Title  Pt will be able to empty dishwasher without limitation by shoulder discomfort/weakness    Baseline  limited in lifting dishes to higher cabinets  Time  6    Period  Weeks    Status  New    Target Date  06/28/17      PT LONG TERM GOAL #2   Title  Will be able to carry a bag of groceries from store to car without switching hands    Baseline  switches hands with any kind of weight    Time  6    Period  Weeks    Status  New    Target Date  06/28/17      PT LONG TERM GOAL #3   Title  Left-sided neck pain to <=2/10 with activities    Baseline  feels like it wraps around shoulder and limits activity    Time  6    Period  Weeks    Status  New    Target Date  06/28/17      PT LONG TERM GOAL #4   Title  FOTO to 35% limited to indicate significant improvement in functional ability    Baseline  41% limited at eval    Time  6    Period  Weeks    Status  New    Target Date  06/28/17            Plan - 06/10/17 1546    Clinical Impression Statement  Notable trigger points creating referred pain were released by manual therapy today, pt reported feeling better following. Discussed soreness to be expected as she strengthens. Good form with movement below shoulder height today. Spasm of Lt  levator scapula with body blade indicating continued overuse of cervical musculature.     PT Treatment/Interventions  ADLs/Self Care Home Management;Cryotherapy;Electrical Stimulation;Ultrasound;Traction;Moist Heat;Iontophoresis 4mg /ml Dexamethasone;Functional mobility training;Therapeutic activities;Therapeutic exercise;Patient/family education;Neuromuscular re-education;Manual techniques;Passive range of motion;Vasopneumatic Device;Taping;Dry needling    PT Next Visit Plan  progress prone scapular exercises, CKC    PT Home Exercise Plan  wall walks, serratus pull out on wall, ball on wall ABCs, supine scap stab series red band, standing IYT to shoulder height    Consulted and Agree with Plan of Care  Patient       Patient will benefit from skilled therapeutic intervention in order to improve the following deficits and impairments:  Improper body mechanics, Pain, Postural dysfunction, Increased muscle spasms, Decreased activity tolerance, Decreased endurance, Decreased strength, Impaired UE functional use  Visit Diagnosis: Chronic right shoulder pain  Chronic left shoulder pain  Muscle weakness (generalized)     Problem List There are no active problems to display for this patient.  Namish Krise C. Nasir Bright PT, DPT 06/10/17 3:50 PM   [facility] [address] [place], [place], [address] Phone: [phone]   Fax:  [phone]  Name: Christine Roberson MRN: [id number] Date of Birth: 08-25-1965

## 2017-06-12 ENCOUNTER — Ambulatory Visit: Payer: Medicare Other | Admitting: Physical Therapy

## 2017-06-12 ENCOUNTER — Encounter: Payer: Self-pay | Admitting: Physical Therapy

## 2017-06-12 DIAGNOSIS — M25511 Pain in right shoulder: Secondary | ICD-10-CM

## 2017-06-12 DIAGNOSIS — M6281 Muscle weakness (generalized): Secondary | ICD-10-CM

## 2017-06-12 DIAGNOSIS — G8929 Other chronic pain: Secondary | ICD-10-CM

## 2017-06-12 DIAGNOSIS — M25512 Pain in left shoulder: Principal | ICD-10-CM

## 2017-06-12 NOTE — Therapy (Signed)
[facility] [address] [place], [place], [address] Phone: [phone]   Fax:  [phone]  Physical Therapy Treatment  Patient Details  Name: Christine Roberson MRN: [id number] Date of Birth: [birth year] Referring Provider: Margarita Rana, MD   Encounter Date: 06/12/2017  PT End of Session - 06/12/17 1415    Visit Number  8    Number of Visits  13    Date for PT Re-Evaluation  06/28/17    Authorization Type  UHC MCR    PT Start Time  1415    PT Stop Time  1505    PT Time Calculation (min)  50 min    Activity Tolerance  Patient tolerated treatment well    Behavior During Therapy  [facility] for tasks assessed/performed       Past Medical History:  Diagnosis Date  . Arthritis   . Chronic back pain   . DDD (degenerative disc disease), lumbar   . Headache   . MVP (mitral valve prolapse)     Past Surgical History:  Procedure Laterality Date  . ABDOMINAL HYSTERECTOMY    . CLOSED MANIPULATION SHOULDER WITH STERIOD INJECTION Right 03/05/2017   Procedure: CLOSED MANIPULATION SHOULDER WITH STEROID INJECTION;  Surgeon: Sheral Apley, MD;  Location: [facility];  Service: Orthopedics;  Laterality: Right;  . JOINT REPLACEMENT Right    TKR  . KNEE ARTHROSCOPY Bilateral   . SPINAL CORD STIMULATOR IMPLANT    . SPINAL CORD STIMULATOR REMOVAL    . TONSILLECTOMY      There were no vitals filed for this visit.  Subjective Assessment - 06/12/17 1415    Subjective  Lt side of neck is burning today.                       [facility] Adult PT Treatment/Exercise - 06/12/17 0001      Shoulder Exercises: Supine   Horizontal ABduction  Both;15 reps yellow tband +belly breathing    External Rotation  15 reps yellow tband    Other Supine Exercises  belly breathing      Shoulder Exercises: Stretch   Other Shoulder Stretches  first rib with sheet    Other Shoulder Stretches  supine abd stretch with belly breathing, door  pec stretch      Moist Heat Therapy   Number Minutes Moist Heat  10 Minutes    Moist Heat Location  Cervical      Manual Therapy   Manual Therapy  Joint mobilization;Soft tissue mobilization    Joint Mobilization  cervical lateral mobs, Lt first rib with breathing    Soft tissue mobilization  Lt upper trap, scalenes, suboccipitals                  PT Long Term Goals - 05/14/17 1528      PT LONG TERM GOAL #1   Title  Pt will be able to empty dishwasher without limitation by shoulder discomfort/weakness    Baseline  limited in lifting dishes to higher cabinets    Time  6    Period  Weeks    Status  New    Target Date  06/28/17      PT LONG TERM GOAL #2   Title  Will be able to carry a bag of groceries from store to car without switching hands    Baseline  switches hands with any kind of weight    Time  6  Period  Weeks    Status  New    Target Date  06/28/17      PT LONG TERM GOAL #3   Title  Left-sided neck pain to <=2/10 with activities    Baseline  feels like it wraps around shoulder and limits activity    Time  6    Period  Weeks    Status  New    Target Date  06/28/17      PT LONG TERM GOAL #4   Title  FOTO to 35% limited to indicate significant improvement in functional ability    Baseline  41% limited at eval    Time  6    Period  Weeks    Status  New    Target Date  06/28/17            Plan - 06/12/17 1457    Clinical Impression Statement  Able to decrease burning in scalenes today. Pt with difficulty breathing through abdomen resulting in excessive tension in cervical region. WilL DN scalenes and upper trap at next visit.     PT Treatment/Interventions  ADLs/Self Care Home Management;Cryotherapy;Electrical Stimulation;Ultrasound;Traction;Moist Heat;Iontophoresis 4mg /ml Dexamethasone;Functional mobility training;Therapeutic activities;Therapeutic exercise;Patient/family education;Neuromuscular re-education;Manual techniques;Passive range of  motion;Vasopneumatic Device;Taping;Dry needling    PT Next Visit Plan  DN, review belly breathing, prone periscap exercises    PT Home Exercise Plan  wall walks, serratus pull out on wall, ball on wall ABCs, supine scap stab series red band, standing IYT to shoulder height; belly breathing, first rib mobs    Consulted and Agree with Plan of Care  Patient       Patient will benefit from skilled therapeutic intervention in order to improve the following deficits and impairments:  Improper body mechanics, Pain, Postural dysfunction, Increased muscle spasms, Decreased activity tolerance, Decreased endurance, Decreased strength, Impaired UE functional use  Visit Diagnosis: Chronic left shoulder pain  Chronic right shoulder pain  Muscle weakness (generalized)     Problem List There are no active problems to display for this patient.   Pier Laux C. Mckynzie Liwanag PT, DPT 06/12/17 2:59 PM   [facility] [address] [place], [place], [address] Phone: [phone]   Fax:  [phone]  Name: Christine Roberson MRN: [id number] Date of Birth: 05-Mar-1966

## 2017-06-17 ENCOUNTER — Encounter: Payer: Self-pay | Admitting: Physical Therapy

## 2017-06-17 ENCOUNTER — Ambulatory Visit: Payer: Medicare Other | Admitting: Physical Therapy

## 2017-06-17 DIAGNOSIS — M6281 Muscle weakness (generalized): Secondary | ICD-10-CM

## 2017-06-17 DIAGNOSIS — M25511 Pain in right shoulder: Secondary | ICD-10-CM | POA: Diagnosis not present

## 2017-06-17 DIAGNOSIS — M25512 Pain in left shoulder: Principal | ICD-10-CM

## 2017-06-17 DIAGNOSIS — G8929 Other chronic pain: Secondary | ICD-10-CM

## 2017-06-17 NOTE — Therapy (Signed)
[facility] [address] [place], [place], [address] Phone: [phone]   Fax:  [phone]  Physical Therapy Treatment  Patient Details  Name: Christine Roberson MRN: [id number] Date of Birth: 1965/09/27 Referring Provider: Margarita Rana, MD   Encounter Date: 06/17/2017  PT End of Session - 06/17/17 1343    Visit Number  9    Number of Visits  13    Date for PT Re-Evaluation  06/28/17    Authorization Type  UHC MCR    PT Start Time  1346    PT Stop Time  1438    PT Time Calculation (min)  52 min    Activity Tolerance  Patient tolerated treatment well    Behavior During Therapy  [facility] for tasks assessed/performed       Past Medical History:  Diagnosis Date  . Arthritis   . Chronic back pain   . DDD (degenerative disc disease), lumbar   . Headache   . MVP (mitral valve prolapse)     Past Surgical History:  Procedure Laterality Date  . ABDOMINAL HYSTERECTOMY    . CLOSED MANIPULATION SHOULDER WITH STERIOD INJECTION Right 03/05/2017   Procedure: CLOSED MANIPULATION SHOULDER WITH STEROID INJECTION;  Surgeon: Sheral Apley, MD;  Location: [facility];  Service: Orthopedics;  Laterality: Right;  . JOINT REPLACEMENT Right    TKR  . KNEE ARTHROSCOPY Bilateral   . SPINAL CORD STIMULATOR IMPLANT    . SPINAL CORD STIMULATOR REMOVAL    . TONSILLECTOMY      There were no vitals filed for this visit.  Subjective Assessment - 06/17/17 1346    Subjective  I have been trying to watch my breathing patterns, burning is better but is still there. Severe pain at night and in the AM. Shoulder blades feel sore but is less and notices it is a little easier to reach.     Currently in Pain?  Yes    Pain Score  2     Pain Location  Neck    Pain Orientation  Left;Anterior    Pain Descriptors / Indicators  Burning                      [facility] Adult PT Treatment/Exercise - 06/17/17 0001      Shoulder Exercises:  Supine   Flexion  Other (comment) 2# alt flexion with belly breathing    Other Supine Exercises  hooklying belly breathing      Shoulder Exercises: Sidelying   External Rotation  15 reps 2# towel under elbow    Flexion  Right;15 reps 2#    ABduction  Right;15 reps 2#      Shoulder Exercises: ROM/Strengthening   UBE (Upper Arm Bike)  2'/2' L1.5      Moist Heat Therapy   Number Minutes Moist Heat  10 Minutes    Moist Heat Location  Cervical      Manual Therapy   Manual therapy comments  manual cervical traction    Joint Mobilization  clavicle & first rib    Soft tissue mobilization  Lt scalenes             PT Education - 06/17/17 1431    Education provided  Yes    Education Details  pillows/positioning at night, sleep study, exercise form/rationale    Person(s) Educated  Patient    Methods  Explanation;Demonstration;Tactile cues;Verbal cues;Handout    Comprehension  Verbalized understanding;Need further instruction;Returned demonstration;Verbal cues  required;Tactile cues required          PT Long Term Goals - 05/14/17 1528      PT LONG TERM GOAL #1   Title  Pt will be able to empty dishwasher without limitation by shoulder discomfort/weakness    Baseline  limited in lifting dishes to higher cabinets    Time  6    Period  Weeks    Status  New    Target Date  06/28/17      PT LONG TERM GOAL #2   Title  Will be able to carry a bag of groceries from store to car without switching hands    Baseline  switches hands with any kind of weight    Time  6    Period  Weeks    Status  New    Target Date  06/28/17      PT LONG TERM GOAL #3   Title  Left-sided neck pain to <=2/10 with activities    Baseline  feels like it wraps around shoulder and limits activity    Time  6    Period  Weeks    Status  New    Target Date  06/28/17      PT LONG TERM GOAL #4   Title  FOTO to 35% limited to indicate significant improvement in functional ability    Baseline  41% limited  at eval    Time  6    Period  Weeks    Status  New    Target Date  06/28/17            Plan - 06/17/17 1402    Clinical Impression Statement  recommended sleep study to evaluate breathing due to ability to decrease neck pain with breathing. Pt reports inability to breathe through rt side of nose. Improving ability to demo coordinated movement and control in periscapular region. sidelying exercises to 90 flx & abd resulting in fatigue.     PT Treatment/Interventions  ADLs/Self Care Home Management;Cryotherapy;Electrical Stimulation;Ultrasound;Traction;Moist Heat;Iontophoresis 4mg /ml Dexamethasone;Functional mobility training;Therapeutic activities;Therapeutic exercise;Patient/family education;Neuromuscular re-education;Manual techniques;Passive range of motion;Vasopneumatic Device;Taping;Dry needling    PT Next Visit Plan  progress sidelying periscap exercises, re-evaluate scalenes, did she move pillows?    PT Home Exercise Plan  wall walks, serratus pull out on wall, ball on wall ABCs, supine scap stab series red band, standing IYT to shoulder height; belly breathing, first rib mobs; sidelying GHJ flx/abd/ER    Consulted and Agree with Plan of Care  Patient       Patient will benefit from skilled therapeutic intervention in order to improve the following deficits and impairments:  Improper body mechanics, Pain, Postural dysfunction, Increased muscle spasms, Decreased activity tolerance, Decreased endurance, Decreased strength, Impaired UE functional use  Visit Diagnosis: Chronic left shoulder pain  Chronic right shoulder pain  Muscle weakness (generalized)     Problem List There are no active problems to display for this patient.   Niall Illes C. Brittanya Winburn PT, DPT 06/17/17 2:36 PM   [facility] [address] [place], [place], [address] Phone: [phone]   Fax:  [phone]  Name: Christine GrinderDenise Roberson MRN: [id number] Date of  Birth: [birth year]

## 2017-06-19 ENCOUNTER — Encounter: Payer: Self-pay | Admitting: Physical Therapy

## 2017-06-19 ENCOUNTER — Ambulatory Visit: Payer: Medicare Other | Admitting: Physical Therapy

## 2017-06-19 DIAGNOSIS — M25511 Pain in right shoulder: Secondary | ICD-10-CM

## 2017-06-19 DIAGNOSIS — G8929 Other chronic pain: Secondary | ICD-10-CM

## 2017-06-19 DIAGNOSIS — M25512 Pain in left shoulder: Principal | ICD-10-CM

## 2017-06-19 DIAGNOSIS — M6281 Muscle weakness (generalized): Secondary | ICD-10-CM

## 2017-06-19 NOTE — Therapy (Signed)
[facility] [address] [place], [place], [address] Phone: [phone]   Fax:  [phone]  Physical Therapy Treatment  Patient Details  Name: Christine Roberson MRN: [id number] Date of Birth: [birth year] Referring Provider: Margarita Rana, MD   Encounter Date: 06/19/2017  PT End of Session - 06/19/17 1240    Visit Number  10    Number of Visits  13    Date for PT Re-Evaluation  06/28/17    Authorization Type  UHC MCR    PT Start Time  1234    PT Stop Time  1320    PT Time Calculation (min)  46 min       Past Medical History:  Diagnosis Date  . Arthritis   . Chronic back pain   . DDD (degenerative disc disease), lumbar   . Headache   . MVP (mitral valve prolapse)     Past Surgical History:  Procedure Laterality Date  . ABDOMINAL HYSTERECTOMY    . CLOSED MANIPULATION SHOULDER WITH STERIOD INJECTION Right 03/05/2017   Procedure: CLOSED MANIPULATION SHOULDER WITH STEROID INJECTION;  Surgeon: Sheral Apley, MD;  Location: [facility];  Service: Orthopedics;  Laterality: Right;  . JOINT REPLACEMENT Right    TKR  . KNEE ARTHROSCOPY Bilateral   . SPINAL CORD STIMULATOR IMPLANT    . SPINAL CORD STIMULATOR REMOVAL    . TONSILLECTOMY      There were no vitals filed for this visit.  Subjective Assessment - 06/19/17 1236    Subjective  More pain today. I had trouble sleeping.     Currently in Pain?  Yes    Pain Score  4     Pain Location  Scapula    Pain Orientation  Right;Left    Pain Descriptors / Indicators  Aching    Aggravating Factors   unsure    Pain Relieving Factors  unsure                       [facility] Adult PT Treatment/Exercise - 06/19/17 0001      Shoulder Exercises: Supine   Horizontal ABduction  15 reps red    External Rotation  15 reps red    Flexion  Other (comment) 2# alt flexion with belly breathing    Other Supine Exercises  neck retraction with 2# arm circles x 10 each  way       Shoulder Exercises: Sidelying   External Rotation  Right;Left;15 reps 2# towel under elbow    Flexion  Left;Both;15 reps    ABduction  Right;Left;15 reps      Shoulder Exercises: Pulleys   Flexion  3 minutes      Modalities   Modalities  Electrical Stimulation      Moist Heat Therapy   Number Minutes Moist Heat  10 Minutes    Moist Heat Location  Cervical      Electrical Stimulation   Electrical Stimulation Location  Left shoulder and scapula    Electrical Stimulation Action  IFC x 15 min    Electrical Stimulation Parameters  17ma    Electrical Stimulation Goals  Pain                  PT Long Term Goals - 05/14/17 1528      PT LONG TERM GOAL #1   Title  Pt will be able to empty dishwasher without limitation by shoulder discomfort/weakness    Baseline  limited in lifting  dishes to higher cabinets    Time  6    Period  Weeks    Status  New    Target Date  06/28/17      PT LONG TERM GOAL #2   Title  Will be able to carry a bag of groceries from store to car without switching hands    Baseline  switches hands with any kind of weight    Time  6    Period  Weeks    Status  New    Target Date  06/28/17      PT LONG TERM GOAL #3   Title  Left-sided neck pain to <=2/10 with activities    Baseline  feels like it wraps around shoulder and limits activity    Time  6    Period  Weeks    Status  New    Target Date  06/28/17      PT LONG TERM GOAL #4   Title  FOTO to 35% limited to indicate significant improvement in functional ability    Baseline  41% limited at eval    Time  6    Period  Weeks    Status  New    Target Date  06/28/17            Plan - 06/19/17 1252    Clinical Impression Statement  Pt reports improvement in ability to reach into cabinets. She has increased pain today between shoulder blades at 4/10. She reports pain last night with trying to sleep and be aware of positioning.  Sidelying exercises increased pain in left  shoulder to 6/10 today. IFC used for Pain at end of session. She reports scapula pain resolved and left shoulder pain reduced after stim. She has a TENS unit at home and may bring it in.     PT Next Visit Plan  progress sidelying periscap exercises, re-evaluate scalenes, did she move pillows? assess benefit of IFC, did she bring her TENS?     PT Home Exercise Plan  wall walks, serratus pull out on wall, ball on wall ABCs, supine scap stab series red band, standing IYT to shoulder height; belly breathing, first rib mobs; sidelying GHJ flx/abd/ER    Consulted and Agree with Plan of Care  Patient       Patient will benefit from skilled therapeutic intervention in order to improve the following deficits and impairments:  Improper body mechanics, Pain, Postural dysfunction, Increased muscle spasms, Decreased activity tolerance, Decreased endurance, Decreased strength, Impaired UE functional use  Visit Diagnosis: Chronic left shoulder pain  Chronic right shoulder pain  Muscle weakness (generalized)     Problem List There are no active problems to display for this patient.   Sherrie MustacheDonoho, Othniel Maret McGee, [place]PTA 06/19/2017, 1:41 PM  [facility] [address] [place], [place], [address] Phone: [phone]   Fax:  [phone]  Name: Christine Roberson MRN: [id number] Date of Birth: 06-26-1965

## 2017-06-25 ENCOUNTER — Encounter: Payer: Self-pay | Admitting: Physical Therapy

## 2017-06-25 ENCOUNTER — Ambulatory Visit: Payer: Medicare Other | Admitting: Physical Therapy

## 2017-06-25 DIAGNOSIS — M25512 Pain in left shoulder: Principal | ICD-10-CM

## 2017-06-25 DIAGNOSIS — M6281 Muscle weakness (generalized): Secondary | ICD-10-CM

## 2017-06-25 DIAGNOSIS — M25511 Pain in right shoulder: Secondary | ICD-10-CM | POA: Diagnosis not present

## 2017-06-25 DIAGNOSIS — G8929 Other chronic pain: Secondary | ICD-10-CM

## 2017-06-25 NOTE — Therapy (Signed)
[facility] [address] [place], [place], [address] Phone: [phone]   Fax:  [phone]  Physical Therapy Treatment  Patient Details  Name: Christine Roberson MRN: [id number] Date of Birth: 1965-10-18 Referring Provider: Margarita Ranaimothy Murphy, MD   Encounter Date: 06/25/2017  PT End of Session - 06/25/17 1417    Visit Number  11    Number of Visits  13    Date for PT Re-Evaluation  06/28/17    Authorization Type  [employer] MCR    PT Start Time  1417    PT Stop Time  1500    PT Time Calculation (min)  43 min    Activity Tolerance  Patient tolerated treatment well    Behavior During Therapy  [facility] assessed/performed       Past Medical History:  Diagnosis Date  . Arthritis   . Chronic back pain   . DDD (degenerative disc disease), lumbar   . Headache   . MVP (mitral valve prolapse)     Past Surgical History:  Procedure Laterality Date  . ABDOMINAL HYSTERECTOMY    . CLOSED MANIPULATION SHOULDER WITH STERIOD INJECTION Right 03/05/2017   Procedure: CLOSED MANIPULATION SHOULDER WITH STEROID INJECTION;  Surgeon: Sheral ApleyMurphy, Timothy D, MD;  Location: [facility];  Service: Orthopedics;  Laterality: Right;  . JOINT REPLACEMENT Right    TKR  . KNEE ARTHROSCOPY Bilateral   . SPINAL CORD STIMULATOR IMPLANT    . SPINAL CORD STIMULATOR REMOVAL    . TONSILLECTOMY      There were no vitals filed for this visit.  Subjective Assessment - 06/25/17 1420    Subjective  scalenes hurt as well as post aspect of upper trap. pain began after she woke herself up this morning snoring. HA approx 1-2/week, denies N/T into arm.                 No data recorded       [facility] Adult PT Treatment/Exercise - 06/25/17 0001      Manual Therapy   Manual therapy comments  skilled palpation & monitoring during TPDN    Soft tissue mobilization  Lt upper trap & scalenes       Trigger Point Dry Needling - 06/25/17 1451    Consent  Given?  Yes    Education Handout Provided  -- verbal education    Muscles Treated Upper Body  Upper trapezius scalenes    Upper Trapezius Response  Twitch reponse elicited;Palpable increased muscle length           PT Education - 06/25/17 1452    Education provided  Yes    Education Details  use of home TENS, TPDN & expected outcomes. symptom pattern discussion.     Person(s) Educated  Patient    Methods  Explanation    Comprehension  Verbalized understanding;Need further instruction          PT Long Term Goals - 05/14/17 1528      PT LONG TERM GOAL #1   Title  Pt will be able to empty dishwasher without limitation by shoulder discomfort/weakness    Baseline  limited in lifting dishes to higher cabinets    Time  6    Period  Weeks    Status  New    Target Date  06/28/17      PT LONG TERM GOAL #2   Title  Will be able to carry a bag of groceries from store to car without  switching hands    Baseline  switches hands with any kind of weight    Time  6    Period  Weeks    Status  New    Target Date  06/28/17      PT LONG TERM GOAL #3   Title  Left-sided neck pain to <=2/10 with activities    Baseline  feels like it wraps around shoulder and limits activity    Time  6    Period  Weeks    Status  New    Target Date  06/28/17      PT LONG TERM GOAL #4   Title  FOTO to 35% limited to indicate significant improvement in functional ability    Baseline  41% limited at eval    Time  6    Period  Weeks    Status  New    Target Date  06/28/17            Plan - 06/25/17 1509    Clinical Impression Statement  Decreased concordant pain and burning with DN today with reported soreness as expected. Continues to have pain when waking in the morning and has noticed an increase in snoring. Could feel overuse of scalenes in relaxed breathing while DN today.     PT Treatment/Interventions  ADLs/Self Care Home Management;Cryotherapy;Electrical  Stimulation;Ultrasound;Traction;Moist Heat;Iontophoresis 4mg /ml Dexamethasone;Functional mobility training;Therapeutic activities;Therapeutic exercise;Patient/family education;Neuromuscular re-education;Manual techniques;Passive range of motion;Vasopneumatic Device;Taping;Dry needling    PT Next Visit Plan  ERO    PT Home Exercise Plan  wall walks, serratus pull out on wall, ball on wall ABCs, supine scap stab series red band, standing IYT to shoulder height; belly breathing, first rib mobs; sidelying GHJ flx/abd/ER    Consulted and Agree with Plan of Care  Patient       Patient will benefit from skilled therapeutic intervention in order to improve the following deficits and impairments:  Improper body mechanics, Pain, Postural dysfunction, Increased muscle spasms, Decreased activity tolerance, Decreased endurance, Decreased strength, Impaired UE functional use  Visit Diagnosis: Chronic left shoulder pain  Chronic right shoulder pain  Muscle weakness (generalized)     Problem List There are no active problems to display for this patient.   Jylan Loeza C. Madigan Rosensteel PT, DPT 06/25/17 3:13 PM   [facility] [address] [place], [place], [address] Phone: [phone]   Fax:  [phone]  Name: Christine Roberson MRN: [id number] Date of Birth: [birth year]

## 2017-06-28 ENCOUNTER — Ambulatory Visit: Payer: Medicare Other | Admitting: Physical Therapy

## 2017-06-28 ENCOUNTER — Encounter: Payer: Self-pay | Admitting: Physical Therapy

## 2017-06-28 DIAGNOSIS — G8929 Other chronic pain: Secondary | ICD-10-CM

## 2017-06-28 DIAGNOSIS — M25511 Pain in right shoulder: Secondary | ICD-10-CM | POA: Diagnosis not present

## 2017-06-28 DIAGNOSIS — M6281 Muscle weakness (generalized): Secondary | ICD-10-CM

## 2017-06-28 DIAGNOSIS — M25512 Pain in left shoulder: Principal | ICD-10-CM

## 2017-06-28 NOTE — Therapy (Signed)
[place], [place], [address] Phone: [phone]   Fax:  [phone]  Physical Therapy Treatment/Discharge Summary  Patient Details  Name: Christine Roberson MRN: [id number] Date of Birth: 1965/05/16 Referring Provider: Edmonia Lynch, MD   Encounter Date: 06/28/2017  PT End of Session - 06/28/17 0933    Visit Number  12    Number of Visits  13    Date for PT Re-Evaluation  06/28/17    Authorization Type  UHC MCR    PT Start Time  0931    PT Stop Time  1007    PT Time Calculation (min)  36 min    Activity Tolerance  Patient tolerated treatment well    Behavior During Therapy  [facility] for tasks assessed/performed       Past Medical History:  Diagnosis Date  . Arthritis   . Chronic back pain   . DDD (degenerative disc disease), lumbar   . Headache   . MVP (mitral valve prolapse)     Past Surgical History:  Procedure Laterality Date  . ABDOMINAL HYSTERECTOMY    . CLOSED MANIPULATION SHOULDER WITH STERIOD INJECTION Right 03/05/2017   Procedure: CLOSED MANIPULATION SHOULDER WITH STEROID INJECTION;  Surgeon: Christine Butters, MD;  Location: [place];  Service: Orthopedics;  Laterality: Right;  . JOINT REPLACEMENT Right    TKR  . KNEE ARTHROSCOPY Bilateral   . SPINAL CORD STIMULATOR IMPLANT    . SPINAL CORD STIMULATOR REMOVAL    . TONSILLECTOMY      There were no vitals filed for this visit.  Subjective Assessment - 06/28/17 0940    Subjective  Pain between shoulder blades and Lt scalenes. migraine last night after going to craft store. Not limited by shoulder movement. Upper trap felt relief from DN but scalene pain returned the next day.          [facility] PT Assessment - 06/28/17 0001      Posture/Postural Control   Posture Comments  no longer presents with Lt scapular elevation; cont bil winging      AROM   Overall AROM Comments  GHJ ROM WFL without pain in shoulders      Strength   Right Shoulder Horizontal ABduction  4+/5    Left Shoulder Horizontal ABduction  -- 4+/5    Right Hand Grip (lbs)  43 43 35    Left Hand Grip (lbs)  45 45 45            No data recorded       [facility] Adult PT Treatment/Exercise - 06/28/17 0001      Manual Therapy   Joint Mobilization  lateral cervical glides, Lt first rib depression    Soft tissue mobilization  Lt upper trap, scalanes, SCM, manual cervical traction +suboccipital release             PT Education - 06/28/17 1011    Education provided  Yes    Education Details  goals, FOTO, cont HEP    Person(s) Educated  Patient    Methods  Explanation    Comprehension  Verbalized understanding          PT Long Term Goals - 06/28/17 0940      PT LONG TERM GOAL #1   Title  Pt will be able to empty dishwasher without limitation by shoulder discomfort/weakness    Baseline  able    Status  Achieved      PT LONG  TERM GOAL #2   Title  Will be able to carry a bag of groceries from store to car without switching hands    Baseline  able to carry a gallon of milk but anything 2 2L or heavier requires switching    Status  Partially Met      PT LONG TERM GOAL #3   Title  Left-sided neck pain to <=2/10 with activities    Baseline  good days 2/10, bad days up to 8/10    Status  Partially Met      PT LONG TERM GOAL #4   Title  FOTO to 35% limited to indicate significant improvement in functional ability    Baseline  34% limitation    Status  Achieved            Plan - 06/28/17 1008    Clinical Impression Statement  Pt has met her goals at this time under evaluation for bil shoulder pain. As shoulders have resolved there has been notable increase in awareness of pain in Lt scalenes/SCM and bil scapula. Pt is having a sleep study performed this week to evaluate mechanics that I believe are significantly affecting muscular mechanics. Encouraged pt to continue with HEP for shoulders and be aware of  posture as well as call with any questions. Encouraged pt to return if needed for neck pain after finding results of sleep study if PT is appropriate. Pt verbalized comfort and understanding.     PT Treatment/Interventions  ADLs/Self Care Home Management;Cryotherapy;Electrical Stimulation;Ultrasound;Traction;Moist Heat;Iontophoresis 33m/ml Dexamethasone;Functional mobility training;Therapeutic activities;Therapeutic exercise;Patient/family education;Neuromuscular re-education;Manual techniques;Passive range of motion;Vasopneumatic Device;Taping;Dry needling    PT Home Exercise Plan  wall walks, serratus pull out on wall, ball on wall ABCs, supine scap stab series red band, standing IYT to shoulder height; belly breathing, first rib mobs; sidelying GHJ flx/abd/ER    Consulted and Agree with Plan of Care  Patient       Patient will benefit from skilled therapeutic intervention in order to improve the following deficits and impairments:  Improper body mechanics, Pain, Postural dysfunction, Increased muscle spasms, Decreased activity tolerance, Decreased endurance, Decreased strength, Impaired UE functional use  Visit Diagnosis: Chronic left shoulder pain  Chronic right shoulder pain  Muscle weakness (generalized)     Problem List There are no active problems to display for this patient.   PHYSICAL THERAPY DISCHARGE SUMMARY  Visits from Start of Care: 12  Current functional level related to goals / functional outcomes: See above   Remaining deficits: See above   Education / Equipment: Anatomy of condition, POC, HEP, exercise form/rationale  Plan: Patient agrees to discharge.  Patient goals were partially met. Patient is being discharged due to                                                     ?????    PT completed for bil shoulder pain, d/c at this time for further testing for neck/thoracic pain.   Christine Roberson C. Christine Roberson PT, DPT 06/28/17 10:13 AM   [facility] [place] [address]Phone: [phone]  Fax:  [phone] Name: DBuna CuppettMRN: [id number] of Birth: 413-Nov-1967

## 2017-07-02 ENCOUNTER — Ambulatory Visit: Payer: Medicare Other | Admitting: Physical Therapy

## 2017-07-04 ENCOUNTER — Encounter: Payer: Medicare Other | Admitting: Physical Therapy

## 2018-02-24 ENCOUNTER — Other Ambulatory Visit: Payer: Self-pay | Admitting: Physical Medicine and Rehabilitation

## 2018-02-24 DIAGNOSIS — M545 Low back pain: Principal | ICD-10-CM

## 2018-02-24 DIAGNOSIS — G8929 Other chronic pain: Secondary | ICD-10-CM

## 2018-03-09 ENCOUNTER — Ambulatory Visit
Admission: RE | Admit: 2018-03-09 | Discharge: 2018-03-09 | Disposition: A | Discharge status: Home / Self Care | Payer: Medicaid Other | Source: Ambulatory Visit | Attending: Physical Medicine and Rehabilitation | Admitting: Physical Medicine and Rehabilitation

## 2018-03-09 ENCOUNTER — Other Ambulatory Visit: Payer: Medicare Other

## 2018-03-09 DIAGNOSIS — G8929 Other chronic pain: Secondary | ICD-10-CM

## 2018-03-09 DIAGNOSIS — M545 Low back pain: Principal | ICD-10-CM

## 2018-04-10 ENCOUNTER — Other Ambulatory Visit: Payer: Self-pay | Admitting: Physical Medicine and Rehabilitation

## 2018-04-10 ENCOUNTER — Telehealth: Payer: Self-pay | Admitting: Nurse Practitioner

## 2018-04-10 DIAGNOSIS — R200 Anesthesia of skin: Secondary | ICD-10-CM

## 2018-04-10 NOTE — Telephone Encounter (Signed)
Phone call to patient to verify medication list and allergies for myelogram procedure. Pt instructed to hold Elavil for 48hrs prior to myelogram appointment time. Pt verbalized understanding. 

## 2018-04-17 ENCOUNTER — Ambulatory Visit
Admission: RE | Admit: 2018-04-17 | Discharge: 2018-04-17 | Disposition: A | Discharge status: Home / Self Care | Payer: 59 | Source: Ambulatory Visit | Attending: Physical Medicine and Rehabilitation | Admitting: Physical Medicine and Rehabilitation

## 2018-04-17 DIAGNOSIS — R200 Anesthesia of skin: Secondary | ICD-10-CM

## 2018-04-17 MED ORDER — DIAZEPAM 5 MG PO TABS
10.0000 mg | ORAL_TABLET | Freq: Once | ORAL | Status: AC
  Administered 2018-04-17: 5 mg via ORAL

## 2018-04-17 MED ORDER — IOPAMIDOL (ISOVUE-M 200) INJECTION 41%
18.0000 mL | Freq: Once | INTRAMUSCULAR | Status: AC
  Administered 2018-04-17: 18 mL via INTRATHECAL

## 2018-04-17 MED ORDER — OXYCODONE-ACETAMINOPHEN 5-325 MG PO TABS
1.0000 | ORAL_TABLET | Freq: Once | ORAL | Status: AC
  Administered 2018-04-17: 1 via ORAL

## 2018-04-17 NOTE — Discharge Instructions (Signed)

## 2018-04-18 ENCOUNTER — Other Ambulatory Visit: Payer: Self-pay | Admitting: Physical Medicine and Rehabilitation

## 2018-11-05 ENCOUNTER — Other Ambulatory Visit: Payer: Self-pay | Admitting: Physical Medicine and Rehabilitation

## 2018-11-05 DIAGNOSIS — M542 Cervicalgia: Secondary | ICD-10-CM

## 2018-11-05 DIAGNOSIS — M545 Low back pain, unspecified: Secondary | ICD-10-CM

## 2018-11-05 DIAGNOSIS — G8929 Other chronic pain: Secondary | ICD-10-CM

## 2018-11-06 ENCOUNTER — Telehealth: Payer: Self-pay | Admitting: Nurse Practitioner

## 2018-11-06 NOTE — Telephone Encounter (Signed)
Phone call to patient to verify medication list and allergies for myelogram procedure. Pt instructed to hold Tramadol for 48hrs prior to myelogram appointment time. Pt verbalized understanding. Pre and post procedure instructions reviewed with pt. 

## 2018-11-17 NOTE — Discharge Instructions (Signed)
Myelogram Discharge Instructions  1. Go home and rest quietly for the next 24 hours.  It is important to lie flat for the next 24 hours.  Get up only to go to the restroom.  You may lie in the bed or on a couch on your back, your stomach, your left side or your right side.  You may have one pillow under your head.  You may have pillows between your knees while you are on your side or under your knees while you are on your back.  2. DO NOT drive today.  Recline the seat as far back as it will go, while still wearing your seat belt, on the way home.  3. You may get up to go to the bathroom as needed.  You may sit up for 10 minutes to eat.  You may resume your normal diet and medications unless otherwise indicated.  Drink lots of extra fluids today and tomorrow.  4. The incidence of headache, nausea, or vomiting is about 5% (one in 20 patients).  If you develop a headache, lie flat and drink plenty of fluids until the headache goes away.  Caffeinated beverages may be helpful.  If you develop severe nausea and vomiting or a headache that does not go away with flat bed rest, call [phone].  5. You may resume normal activities after your 24 hours of bed rest is over; however, do not exert yourself strongly or do any heavy lifting tomorrow. If when you get up you have a headache when standing, go back to bed and force fluids for another 24 hours.  6. Call your physician for a follow-up appointment.  The results of your myelogram will be sent directly to your physician by the following day.  7. If you have any questions or if complications develop after you arrive home, please call [phone].  Discharge instructions have been explained to the patient.  The patient, or the person responsible for the patient, fully understands these instructions.  YOU MAY RESTART YOUR TRAMADOL TOMORROW 11/19/2018 AT 10:30AM AS NEEDED.

## 2018-11-18 ENCOUNTER — Ambulatory Visit
Admission: RE | Admit: 2018-11-18 | Discharge: 2018-11-18 | Disposition: A | Discharge status: Home / Self Care | Payer: 59 | Source: Ambulatory Visit | Attending: Physical Medicine and Rehabilitation | Admitting: Physical Medicine and Rehabilitation

## 2018-11-18 DIAGNOSIS — M542 Cervicalgia: Secondary | ICD-10-CM

## 2018-11-18 DIAGNOSIS — G8929 Other chronic pain: Secondary | ICD-10-CM

## 2018-11-18 DIAGNOSIS — M545 Low back pain, unspecified: Secondary | ICD-10-CM

## 2018-11-18 MED ORDER — OXYCODONE-ACETAMINOPHEN 5-325 MG PO TABS
1.0000 | ORAL_TABLET | Freq: Once | ORAL | Status: AC
  Administered 2018-11-18: 1 via ORAL

## 2018-11-18 MED ORDER — DIAZEPAM 5 MG PO TABS: 10.0000 mg | ORAL_TABLET | Freq: Once | ORAL | Status: DC

## 2018-11-18 MED ORDER — IOPAMIDOL (ISOVUE-M 300) INJECTION 61%
10.0000 mL | Freq: Once | INTRAMUSCULAR | Status: AC | PRN
  Administered 2018-11-18: 11:00:00 10 mL via INTRATHECAL

## 2019-03-11 ENCOUNTER — Emergency Department (INDEPENDENT_AMBULATORY_CARE_PROVIDER_SITE_OTHER)
Admission: EM | Admit: 2019-03-11 | Discharge: 2019-03-11 | Disposition: A | Discharge status: Home / Self Care | Payer: 59 | Source: Home / Self Care | Attending: Family Medicine | Admitting: Family Medicine

## 2019-03-11 ENCOUNTER — Other Ambulatory Visit: Payer: Self-pay

## 2019-03-11 DIAGNOSIS — R339 Retention of urine, unspecified: Secondary | ICD-10-CM

## 2019-03-11 DIAGNOSIS — B356 Tinea cruris: Secondary | ICD-10-CM | POA: Diagnosis not present

## 2019-03-11 DIAGNOSIS — R509 Fever, unspecified: Secondary | ICD-10-CM

## 2019-03-11 LAB — POCT URINALYSIS DIP (MANUAL ENTRY)
Bilirubin, UA: NEGATIVE
Glucose, UA: NEGATIVE mg/dL
Ketones, POC UA: NEGATIVE mg/dL
Leukocytes, UA: NEGATIVE
Nitrite, UA: NEGATIVE
Protein Ur, POC: NEGATIVE mg/dL
Spec Grav, UA: 1.02 (ref 1.010–1.025)
Urobilinogen, UA: 0.2 E.U./dL
pH, UA: 6 (ref 5.0–8.0)

## 2019-03-11 LAB — SEDIMENTATION RATE: Sed Rate: 2 mm/h (ref 0–30)

## 2019-03-11 MED ORDER — KETOCONAZOLE 2 % EX CREA: TOPICAL_CREAM | CUTANEOUS | 0 refills | Status: AC

## 2019-03-11 NOTE — Discharge Instructions (Signed)
Increase fluid intake. °If symptoms become significantly worse during the night or over the weekend, proceed to the local emergency room.  °

## 2019-03-11 NOTE — ED Triage Notes (Signed)
Pt c/o urinary frequency for over a week. Also c/o a sore on her tailbone since having back surgery last month.

## 2019-03-11 NOTE — ED Provider Notes (Signed)
[facility]    CSN: [id number] Arrival date & time: 03/11/19  1125      History   Chief Complaint Chief Complaint  Patient presents with  . Urinary Tract Infection  . Tailbone Pain    HPI Christine Roberson is a 53 y.o. female.   Patient complains of urinary frequency and hesitancy for about a week.  She also complains of a rash on her "tailbone" which has not responded to triamcinolone cream.  She notes that she has had an intermittent low grade fever 99-100 and sweats since undergoing a discectomy (L4-5) on November 3.  She feels well otherwise. She notes that she has an appointment with her cardiologist next week.  The history is provided by the patient.  Urinary Frequency This is a new problem. Episode onset: 1 week ago. The problem occurs constantly. The problem has not changed since onset.Pertinent negatives include no abdominal pain. Nothing aggravates the symptoms. Nothing relieves the symptoms. She has tried nothing for the symptoms.    Past Medical History:  Diagnosis Date  . Arthritis   . Chronic back pain   . DDD (degenerative disc disease), lumbar   . Headache   . MVP (mitral valve prolapse)     There are no active problems to display for this patient.   Past Surgical History:  Procedure Laterality Date  . ABDOMINAL HYSTERECTOMY    . CLOSED MANIPULATION SHOULDER WITH STERIOD INJECTION Right 03/05/2017   Procedure: CLOSED MANIPULATION SHOULDER WITH STEROID INJECTION;  Surgeon: Sheral ApleyMurphy, Timothy D, MD;  Location: [facility];  Service: Orthopedics;  Laterality: Right;  . JOINT REPLACEMENT Right    TKR  . KNEE ARTHROSCOPY Bilateral   . SPINAL CORD STIMULATOR IMPLANT    . SPINAL CORD STIMULATOR REMOVAL    . TONSILLECTOMY      OB History   No obstetric history on file.      Home Medications    Prior to Admission medications   Medication Sig Start Date End Date Taking? Authorizing Provider  cetirizine (ZYRTEC) 10 MG tablet Take 10  mg by mouth daily.    [provider]  cyclobenzaprine (FLEXERIL) 10 MG tablet Take 10 mg by mouth 3 (three) times daily as needed for muscle spasms.    [provider]  diazepam (VALIUM) 2 MG tablet Take 2 mg by mouth every 6 (six) hours as needed for anxiety.    [provider]  Estradiol Acetate (FEMRING) 0.1 MG/24HR RING Place vaginally.    [provider]  ketoconazole (NIZORAL) 2 % cream Apply thin film once daily for two weeks 03/11/19   Lattie HawBeese, Ty Buntrock A, MD  loratadine (CLARITIN) 10 MG tablet Take 10 mg by mouth daily.    [provider]  magnesium oxide (MAG-OX) 400 MG tablet Take 400 mg by mouth daily.    [provider]  metoprolol succinate (TOPROL-XL) 25 MG 24 hr tablet Take 25 mg by mouth daily.    [provider]  oxyCODONE-acetaminophen (PERCOCET/ROXICET) 5-325 MG tablet Take 1 tablet by mouth every 4 (four) hours as needed for severe pain.    [provider]  pregabalin (LYRICA) 50 MG capsule Take 50 mg by mouth every 36 (thirty-six) hours.    [provider]  tiZANidine (ZANAFLEX) 4 MG capsule Take 4 mg by mouth 3 (three) times daily.    [provider]  traMADol (ULTRAM) 50 MG tablet Take by mouth every 6 (six) hours as needed.    [provider]    Family History History reviewed. No pertinent family history.  Social History Social History   Tobacco Use  . Smoking status: Former Smoker    Quit date: 11/20/2015    Years since quitting: 3.3  . Smokeless tobacco: Never Used  Substance Use Topics  . Alcohol use: No  . Drug use: No     Allergies   Demerol [meperidine], Dilaudid [hydromorphone], Morphine and related, Neosporin [neomycin-bacitracin zn-polymyx], Miconazole nitrate, Vicodin [hydrocodone-acetaminophen], and Vistaril [hydroxyzine]   Review of Systems Review of Systems  Constitutional: Positive for diaphoresis. Negative for activity change, appetite change,  fatigue and fever.  HENT: Negative.   Eyes: Negative.   Respiratory: Negative.   Cardiovascular: Negative.   Gastrointestinal: Negative.  Negative for abdominal pain.  Genitourinary: Positive for dysuria, frequency and urgency. Negative for decreased urine volume, flank pain and hematuria.  Musculoskeletal: Negative.   Skin: Negative for rash.     Physical Exam Triage Vital Signs ED Triage Vitals [03/11/19 1206]  Enc Vitals Group     BP (!) 152/74     Pulse Rate 93     Resp 16     Temp 97.8 F (36.6 C)     Temp Source Oral     SpO2 100 %     Weight      Height      Head Circumference      Peak Flow      Pain Score 2     Pain Loc      Pain Edu?      Excl. in [place]?    No data found.  Updated Vital Signs BP (!) 152/74 (BP Location: Left Arm)   Pulse 93   Temp 97.8 F (36.6 C) (Oral)   Resp 16   SpO2 100%   Visual Acuity Right Eye Distance:   Left Eye Distance:   Bilateral Distance:    Right Eye Near:   Left Eye Near:    Bilateral Near:     Physical Exam Vitals and nursing note reviewed. Exam conducted with a chaperone present.  Constitutional:      General: She is not in acute distress.    Appearance: She is not ill-appearing.  HENT:     Head: Normocephalic.  Eyes:     Pupils: Pupils are equal, round, and reactive to light.  Cardiovascular:     Rate and Rhythm: Normal rate.     Pulses: Normal pulses.     Heart sounds: Murmur present.  Pulmonary:     Breath sounds: Normal breath sounds.  Abdominal:     General: Abdomen is flat.     Palpations: Abdomen is soft.     Tenderness: There is no abdominal tenderness.  Musculoskeletal:     Right lower leg: No edema.     Left lower leg: No edema.  Lymphadenopathy:     Cervical: No cervical adenopathy.  Skin:    General: Skin is warm and dry.          Comments: Macular erythematous eruption right medial buttock as noted on diagram.   Neurological:     Mental Status: She is alert.      UC Treatments  / Results  Labs (all labs ordered are listed, but only abnormal results are displayed) Labs Reviewed  POCT URINALYSIS DIP (MANUAL ENTRY) - Abnormal; Notable for the following components:      Result Value   Blood, UA trace-intact (*)    All other components within  normal limits  URINE CULTURE  SEDIMENTATION RATE  POCT CBC W AUTO DIFF ([facility]):  WBC 6.5; LY 26.7; MO 2.5; GR 70.8; Hgb 12.4; Platelets 210     EKG   Radiology No results found.  Procedures Procedures (including critical care time)  Medications Ordered in UC Medications - No data to display  Initial Impression / Assessment and Plan / UC Course  I have reviewed the triage vital signs and the nursing notes.  Pertinent labs & imaging results that were available during my care of the patient were reviewed by me and considered in my medical decision making (see chart for details).    Urine culture pending; treat accordingly. Sed rate pending; concern for possible endocarditis. Followup with cardiologist as scheduled next week. Rx for Nizoral cream once daily.   Final Clinical Impressions(s) / UC Diagnoses   Final diagnoses:  Urinary retention  Persistent fever  Tinea cruris     Discharge Instructions     Increase fluid intake. If symptoms become significantly worse during the night or over the weekend, proceed to the local emergency room.     ED Prescriptions    Medication Sig Dispense Auth. Provider   ketoconazole (NIZORAL) 2 % cream Apply thin film once daily for two weeks 15 g Lattie Haw, MD        Lattie Haw, MD 03/13/19 2340

## 2019-03-13 LAB — URINE CULTURE
MICRO NUMBER:: 1180494
Result:: NO GROWTH
SPECIMEN QUALITY:: ADEQUATE

## 2019-03-16 LAB — POCT CBC W AUTO DIFF (K'VILLE URGENT CARE)

## 2019-03-28 ENCOUNTER — Encounter (HOSPITAL_COMMUNITY): Payer: Self-pay | Admitting: Emergency Medicine

## 2019-03-28 ENCOUNTER — Emergency Department (HOSPITAL_COMMUNITY)
Admission: EM | Admit: 2019-03-28 | Discharge: 2019-03-29 | Disposition: A | Discharge status: Home / Self Care | Payer: 59 | Attending: Emergency Medicine | Admitting: Emergency Medicine

## 2019-03-28 ENCOUNTER — Other Ambulatory Visit: Payer: Self-pay

## 2019-03-28 DIAGNOSIS — R109 Unspecified abdominal pain: Secondary | ICD-10-CM | POA: Insufficient documentation

## 2019-03-28 DIAGNOSIS — Z5321 Procedure and treatment not carried out due to patient leaving prior to being seen by health care provider: Secondary | ICD-10-CM | POA: Diagnosis not present

## 2019-03-28 LAB — URINALYSIS, ROUTINE W REFLEX MICROSCOPIC
Bilirubin Urine: NEGATIVE
Glucose, UA: NEGATIVE mg/dL
Ketones, ur: NEGATIVE mg/dL
Nitrite: POSITIVE — AB
Protein, ur: 30 mg/dL — AB
Specific Gravity, Urine: 1.005 (ref 1.005–1.030)
WBC, UA: >50 WBC/hpf — ABNORMAL HIGH (ref 0–5)
pH: 6 (ref 5.0–8.0)

## 2019-03-28 LAB — COMPREHENSIVE METABOLIC PANEL
ALT: 11 U/L (ref 0–44)
AST: 18 U/L (ref 15–41)
Albumin: 4 g/dL (ref 3.5–5.0)
Alkaline Phosphatase: 78 U/L (ref 38–126)
Anion gap: 7 (ref 5–15)
BUN: 8 mg/dL (ref 6–20)
CO2: 28 mmol/L (ref 22–32)
Calcium: 9.2 mg/dL (ref 8.9–10.3)
Chloride: 99 mmol/L (ref 98–111)
Creatinine, Ser: 0.63 mg/dL (ref 0.44–1.00)
GFR calc Af Amer: >60 mL/min (ref >60)
GFR calc non Af Amer: >60 mL/min (ref >60)
Glucose, Bld: 97 mg/dL (ref 70–99)
Potassium: 3.4 mmol/L — ABNORMAL LOW (ref 3.5–5.1)
Sodium: 134 mmol/L — ABNORMAL LOW (ref 135–145)
Total Bilirubin: 0.4 mg/dL (ref 0.3–1.2)
Total Protein: 6.8 g/dL (ref 6.5–8.1)

## 2019-03-28 LAB — CBC
HCT: 38.5 % (ref 36.0–46.0)
Hemoglobin: 12.5 g/dL (ref 12.0–15.0)
MCH: 28.5 pg (ref 26.0–34.0)
MCHC: 32.5 g/dL (ref 30.0–36.0)
MCV: 87.7 fL (ref 80.0–100.0)
Platelets: 180 10*3/uL (ref 150–400)
RBC: 4.39 MIL/uL (ref 3.87–5.11)
RDW: 12 % (ref 11.5–15.5)
WBC: 8.2 10*3/uL (ref 4.0–10.5)
nRBC: 0 % (ref 0.0–0.2)

## 2019-03-28 LAB — LIPASE, BLOOD: Lipase: 31 U/L (ref 11–51)

## 2019-03-28 MED ORDER — SODIUM CHLORIDE 0.9% FLUSH: 3.0000 mL | Freq: Once | INTRAVENOUS | Status: DC

## 2019-03-29 NOTE — ED Triage Notes (Signed)
Patient complaining of abdominal pain. Patient states that she had this pain for two days.

## 2019-06-04 ENCOUNTER — Other Ambulatory Visit: Payer: Self-pay

## 2019-06-04 ENCOUNTER — Encounter: Payer: Self-pay | Admitting: Physical Therapy

## 2019-06-04 ENCOUNTER — Ambulatory Visit: Payer: 59 | Attending: Physical Medicine and Rehabilitation | Admitting: Physical Therapy

## 2019-06-04 DIAGNOSIS — R262 Difficulty in walking, not elsewhere classified: Secondary | ICD-10-CM | POA: Insufficient documentation

## 2019-06-04 DIAGNOSIS — M6281 Muscle weakness (generalized): Secondary | ICD-10-CM | POA: Diagnosis present

## 2019-06-04 DIAGNOSIS — M25551 Pain in right hip: Secondary | ICD-10-CM | POA: Diagnosis not present

## 2019-06-04 NOTE — Therapy (Signed)
[facility] Outpatient Rehabilitation [facility] [address] [place], [place], [address] Phone: [phone]   Fax:  [phone]  Physical Therapy Evaluation  Patient Details  Name: Christine Roberson MRN: [id number] Date of Birth: Feb 11, 1966 Referring Provider (PT): Romero Belling MD   Encounter Date: 06/04/2019  PT End of Session - 06/04/19 1326    Visit Number  1    Number of Visits  13    Date for PT Re-Evaluation  07/16/19    Authorization Type  [employer] medicare / MCD 2ndary    PT Start Time  1327    PT Stop Time  1420    PT Time Calculation (min)  53 min    Activity Tolerance  Patient limited by pain    Behavior During Therapy  [facility] for tasks assessed/performed       Past Medical History:  Diagnosis Date  . Arthritis   . Chronic back pain   . DDD (degenerative disc disease), lumbar   . Headache   . MVP (mitral valve prolapse)     Past Surgical History:  Procedure Laterality Date  . ABDOMINAL HYSTERECTOMY    . CLOSED MANIPULATION SHOULDER WITH STERIOD INJECTION Right 03/05/2017   Procedure: CLOSED MANIPULATION SHOULDER WITH STEROID INJECTION;  Surgeon: Sheral Apley, MD;  Location: [facility];  Service: Orthopedics;  Laterality: Right;  . JOINT REPLACEMENT Right    TKR  . KNEE ARTHROSCOPY Bilateral   . SPINAL CORD STIMULATOR IMPLANT    . SPINAL CORD STIMULATOR REMOVAL    . TONSILLECTOMY      There were no vitals filed for this visit.   Subjective Assessment - 06/04/19 1330    Subjective  I cant sit on my RT buttock. I had a back microdiscectomy on Novermber 3, 54, laminectomy 06/2018.  I have bursitis in both hips and has gotten worse and had an injection yesterday by Dr Maurice Small. I am so sick of this. 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  <    How long can you stand comfortably?  < 5 min    How long can you walk comfortably?  <5 min    Diagnostic tests  none recently    Patient Stated Goals  To alleviate pain, be able stand and do chores, drive without pain.  Stand and wash dishes.    Currently in Pain?  Yes    Pain Score  7    at worst 10/10 especially at night   Pain Location  Hip    Pain Orientation  Right;Left   RT far worse than LT   Pain Descriptors / Indicators  Dull;Throbbing;Aching;Jabbing    Pain Type  Acute pain   Acute exacerbation of Chronic problem   Pain Onset  1 to 4 weeks ago   2 weeks        [facility] PT Assessment - 06/04/19 0001      Assessment   Medical Diagnosis  RT hip bursitis    Referring Provider (PT)  Romero Belling MD    Onset Date/Surgical Date  05/18/19    Hand Dominance  Left  Prior Therapy  no PT for last 4 years      Precautions   Precautions  None      Restrictions   Weight Bearing Restrictions  No      Balance Screen   Has the patient fallen in the past 6 months  No    Has the patient had a decrease in activity level because of a fear of falling?   No    Is the patient reluctant to leave their home because of a fear of falling?   No      Home Public house manager residence    Living Arrangements  Children    Home Access  Stairs to enter    Entrance Stairs-Number of Steps  1    Entrance Stairs-Rails  None    Home Layout  One level    Additional Comments  difficulty doing steps because of pain      Prior Function   Level of Independence  Independent      Cognition   Overall Cognitive Status  Within Functional Limits for tasks assessed      Observation/Other Assessments   Focus on Therapeutic Outcomes (FOTO)   intake 33% limitation 67% predicted 42%      Functional Tests   Functional tests  Sit to Stand      Sit to Stand   Comments  5 x STS 20. 4  sec   limited by pain     ROM / Strength   AROM / PROM / Strength  AROM;Strength      AROM   Overall AROM   Deficits    Right Hip Extension  10   pain   Right Hip Flexion  80   pain   Right Hip External Rotation   30   pain   Right Hip Internal Rotation   27    Right Hip ABduction  45    Left Hip Extension  23    Left Hip Flexion  125    Left Hip External Rotation   45    Left Hip Internal Rotation   35    Left Hip ABduction  45      Strength   Overall Strength  Deficits    Right Hip Flexion  4/5    Right Hip Extension  3-/5    Right Hip ABduction  3-/5    Left Hip Flexion  4/5    Left Hip Extension  4-/5    Left Hip ABduction  4-/5    Right Knee Flexion  4/5    Right Knee Extension  4/5    Left Knee Flexion  4/5    Left Knee Extension  4/5    Right Ankle Plantar Flexion  4/5    Left Ankle Plantar Flexion  4/5      Flexibility   Soft Tissue Assessment /Muscle Length  yes    Hamstrings  RT 70, LT 70      Palpation   Palpation comment  marked tenderness post greater trochanter and RT gluteals/ piriformis.  Pt with atrophied muscles of RT gluteals.       Ambulation/Gait   Gait Comments  pt with antalgic gait due to RT hip pain/ bursitiis                Objective measurements completed on examination: See above findings.      [facility] Adult PT Treatment/Exercise - 06/04/19 0001  Knee/Hip Exercises: Supine   Bridges  1 set;10 reps    Bridges Limitations  hold for 10 to 15 seconds each      Knee/Hip Exercises: Sidelying   Hip ABduction  1 set;10 reps;Right    Hip ABduction Limitations  red t band      Manual Therapy   Manual Therapy  Soft tissue mobilization;Taping    Manual therapy comments  skilled palpation with TPDN    Soft tissue mobilization  gluteals and piriformis on RT STW    Kinesiotex  Create Space      Kinesiotix   Create Space  100% over RT Greater Trochanter x 2       Trigger Point Dry Needling - 06/04/19 0001    Consent  Given?  Yes    Education Handout Provided  Yes    Muscles Treated Back/Hip  Gluteus medius;Piriformis;Gluteus maximus   RT only   Gluteus Medius Response  Twitch response elicited;Palpable increased muscle length    Gluteus Maximus Response  Twitch response elicited;Palpable increased muscle length    Piriformis Response  Twitch response elicited;Palpable increased muscle length           PT Education - 06/04/19 1732    Education Details  POC Explanation of findings intiial HEP, education on TPDN    Person(s) Educated  Patient    Methods  Explanation;Demonstration;Tactile cues;Verbal cues;Handout    Comprehension  Verbalized understanding;Returned demonstration       PT Short Term Goals - 06/04/19 1722      PT SHORT TERM GOAL #1   Title  STG=LTG        PT Long Term Goals - 06/04/19 1722      PT LONG TERM GOAL #1   Title  Pt will be independent with advance HEP    Time  6    Period  Weeks    Status  New    Target Date  07/16/19      PT LONG TERM GOAL #2   Title  Pt will be able to lift 25 # of groceries from floor to counter    Baseline  unable to carry groceries due to pain    Time  6    Period  Weeks    Target Date  07/16/19      PT LONG TERM GOAL #3   Title  Pt will be able to stand for 30 minutes to complete household chores without exacerbating hip pain    Time  6    Period  Weeks    Status  New    Target Date  07/16/19      PT LONG TERM GOAL #4   Title  FOTO to 42% limited to indicate significant improvement in functional ability    Baseline  67% limtation    Time  6    Period  Weeks    Status  New    Target Date  07/16/19      PT LONG TERM GOAL #5   Title  Pt will be able to drive with pain 8/41 or less in order to drive to the store more comfortably    Time  6    Period  Weeks    Status  New    Target Date  07/16/19             Plan - 06/04/19 1739    Clinical Impression Statement  54 yo female with low weight BMI, L4/L5  microdiscectomy in Fall  and 06/2018 but presents with RT hip bursitis in which she recieved an cortisone injection yesterday. She is unable to sleep on RT side or drive or stand/walk for greater than 5 minutes due to pain.  She is miserable and would like to be able to manage the RT hip bursitus and is fearful that she will irritate LT side as well  Pt will benefit fdrom skilled PT to address impairments in pain, strength, AROM and functional mobility.    Personal Factors and Comorbidities  Comorbidity 1;Comorbidity 2;Past/Current Experience    Comorbidities  chronic back pain (2 surgerie of back in last year , DDD, RT TKR, bone spur off RT shld with manipulation of shld, LT knee 2 arthroscopic OA, Osteoporosis  worse in LT than RT    Examination-Activity Limitations  Stairs;Stand;Sit;Sleep;Dressing;Transfers    Examination-Participation Restrictions  Cleaning    Clinical Decision Making  Moderate    Rehab Potential  Good    PT Frequency  2x / week    PT Duration  6 weeks    PT Treatment/Interventions  Electrical Stimulation;Cryotherapy;Iontophoresis 4mg /ml Dexamethasone;Moist Heat;Ultrasound;Therapeutic exercise;Therapeutic activities;Neuromuscular re-education;Patient/family education;Passive range of motion;Manual techniques;Dry needling;Taping;Joint Manipulations;Spinal Manipulations    PT Next Visit Plan  assess TPDN add to HEP, Iontophoresis?    PT Home Exercise Plan  bridging and clams sidelying    Consulted and Agree with Plan of Care  Patient       Patient will benefit from skilled therapeutic intervention in order to improve the following deficits and impairments:  Decreased activity tolerance, Decreased mobility, Decreased range of motion, Decreased strength, Difficulty walking, Increased muscle spasms, Pain, Obesity  Visit Diagnosis: Pain in right hip  Muscle weakness (generalized)  Difficulty in walking, not elsewhere classified     Problem List There are no problems to  display for this patient.  Voncille Lo, PT Certified Exercise Expert for the Aging Adult  06/04/19 5:49 PM Phone: [phone] Fax: [phone]  [facility] [address] [place], [place], [address] Phone: [phone]   Fax:  [phone]  Name: Tanae Petrosky MRN: [id number] Date of Birth: 04/13/1965

## 2019-06-04 NOTE — Patient Instructions (Signed)
Abduction: Clam (Eccentric) - Side-Lying   HIP: Abduction / External Rotation (Band)   Place band around knees. Lie on side with hips and knees bent. Raise top knee up, squeezing glutes. Keep feet together. Hold _5__ seconds. Use __red______ band. 10___ reps Hold for 15 sec  Bridge   Lie back, legs bent. Inhale, pressing hips up. Keeping ribs in, lengthen lower back. Exhale, rolling down along spine from top. Repeat _10___ times hold for 5-10. Do __1-2__ sessions per day.   Trigger Point Dry Needling  . What is Trigger Point Dry Needling (DN)? o DN is a physical therapy technique used to treat muscle pain and dysfunction. Specifically, DN helps deactivate muscle trigger points (muscle knots).  o A thin filiform needle is used to penetrate the skin and stimulate the underlying trigger point. The goal is for a local twitch response (LTR) to occur and for the trigger point to relax. No medication of any kind is injected during the procedure.   . What Does Trigger Point Dry Needling Feel Like?  o The procedure feels different for each individual patient. Some patients report that they do not actually feel the needle enter the skin and overall the process is not painful. Very mild bleeding may occur. However, many patients feel a deep cramping in the muscle in which the needle was inserted. This is the local twitch response.   Marland Kitchen How Will I feel after the treatment? o Soreness is normal, and the onset of soreness may not occur for a few hours. Typically this soreness does not last longer than two days.  o Bruising is uncommon, however; ice can be used to decrease any possible bruising.  o In rare cases feeling tired or nauseous after the treatment is normal. In addition, your symptoms may get worse before they get better, this period will typically not last longer than 24 hours.   . What Can I do After My Treatment? o Increase your hydration by drinking more water for the next 24  hours. o You may place ice or heat on the areas treated that have become sore, however, do not use heat on inflamed or bruised areas. Heat often brings more relief post needling. o You can continue your regular activities, but vigorous activity is not recommended initially after the treatment for 24 hours. o DN is best combined with other physical therapy such as strengthening, stretching, and other therapies.   Garen Lah, PT Certified Exercise Expert for the Aging Adult  06/04/19 2:06 PM Phone: [phone] Fax: [phone]

## 2019-06-16 ENCOUNTER — Other Ambulatory Visit: Payer: Self-pay

## 2019-06-16 ENCOUNTER — Ambulatory Visit: Payer: 59 | Admitting: Physical Therapy

## 2019-06-16 ENCOUNTER — Encounter: Payer: Self-pay | Admitting: Physical Therapy

## 2019-06-16 DIAGNOSIS — M25551 Pain in right hip: Secondary | ICD-10-CM | POA: Diagnosis not present

## 2019-06-16 DIAGNOSIS — M6281 Muscle weakness (generalized): Secondary | ICD-10-CM

## 2019-06-16 DIAGNOSIS — R262 Difficulty in walking, not elsewhere classified: Secondary | ICD-10-CM

## 2019-06-16 NOTE — Patient Instructions (Signed)
   Garen Lah, PT Certified Exercise Expert for the Aging Adult  06/16/19 3:42 PM Phone: [phone] Fax: [phone]

## 2019-06-16 NOTE — Therapy (Signed)
[facility] Outpatient Rehabilitation [facility] [address] [place], [place], [address] Phone: [phone]   Fax:  [phone]  Physical Therapy Treatment  Patient Details  Name: Christine Roberson MRN: [id number] Date of Birth: 11-06-1965 Referring Provider (PT): Christine Belling MD   Encounter Date: 06/16/2019  PT End of Session - 06/16/19 1503    Visit Number  2    Number of Visits  13    Date for PT Re-Evaluation  07/16/19    Authorization Type  [employer] medicare / MCD 2ndary    PT Start Time  1500    PT Stop Time  1553    PT Time Calculation (min)  53 min    Activity Tolerance  Patient limited by pain    Behavior During Therapy  [facility] for tasks assessed/performed       Past Medical History:  Diagnosis Date  . Arthritis   . Chronic back pain   . DDD (degenerative disc disease), lumbar   . Headache   . MVP (mitral valve prolapse)     Past Surgical History:  Procedure Laterality Date  . ABDOMINAL HYSTERECTOMY    . CLOSED MANIPULATION SHOULDER WITH STERIOD INJECTION Right 03/05/2017   Procedure: CLOSED MANIPULATION SHOULDER WITH STEROID INJECTION;  Surgeon: Christine Apley, MD;  Location: [facility];  Service: Orthopedics;  Laterality: Right;  . JOINT REPLACEMENT Right    TKR  . KNEE ARTHROSCOPY Bilateral   . SPINAL CORD STIMULATOR IMPLANT    . SPINAL CORD STIMULATOR REMOVAL    . TONSILLECTOMY      There were no vitals filed for this visit.  Subjective Assessment - 06/16/19 1503    Subjective  The last two nights I have been having cramping from hips down to feet ,  Worse at night.  Rt hip pain is better between injection and the TPDN I am better.  When I am standing or sitting with my RT leg on the floor it is trembling and I cant control it.  I have an appt with my neuro surgeon on Thursday.    Pertinent History  chronic back pain (2 surgerie of back in last year , DDD, RT TKR, bone spur off RT shld with manipulation of shld, LT knee 2  arthroscopic OA, Osteoporosis  worse in LT than RT    Patient Stated Goals  To alleviate pain, be able stand and do chores, drive without pain.  Stand and wash dishes.    Currently in Pain?  Yes    Pain Score  4     Pain Location  Hip    Pain Orientation  Right    Pain Descriptors / Indicators  Dull;Aching    Pain Onset  More than a month ago    Pain Frequency  Constant                Add to HEP  Access Code: [email or web address]: [email or web address]: 03/16/2021Prepared by: Christine Denis BeardsleyExercises  Sit to Stand with Arm Reach Toward Target - 1 x daily - 7 x weekly - 10 reps - 3 sets  Goblet Squat with Kettlebell - 1 x daily - 7 x weekly - 3 sets - 10 reps          [facility] Adult PT Treatment/Exercise - 06/16/19 0001      Knee/Hip Exercises: Standing   Other Standing Knee Exercises  sit to stand sink squat 2 x 10  and then goblet squat 2 x 8 with 5 lbs  Knee/Hip Exercises: Supine   Bridges  10 reps;1 set    Bridges Limitations  hold for 10 to 15 seconds each then 1 set of 10 iwth 5 lb wt hold for 3 sec      Knee/Hip Exercises: Sidelying   Hip ABduction  10 reps;Right;2 sets    Hip ABduction Limitations  red t band  LT sidelying only      Modalities   Modalities  Moist Heat      Moist Heat Therapy   Number Minutes Moist Heat  15 Minutes   concurrent with exercise   Moist Heat Location  Hip   RT     Manual Therapy   Manual Therapy  Soft tissue mobilization;Taping    Manual therapy comments  skilled palpation with TPDN    Soft tissue mobilization  gluteals and piriformis on RT STW and with IASTYM       Trigger Point Dry Needling - 06/16/19 0001    Consent Given?  Yes    Education Handout Provided  Previously provided    Muscles Treated Back/Hip  Gluteus medius;Piriformis;Gluteus maximus   RT only   Gluteus Medius Response  Twitch response elicited;Palpable increased muscle length    Gluteus Maximus Response  Twitch response elicited;Palpable  increased muscle length    Piriformis Response  Twitch response elicited;Palpable increased muscle length           PT Education - 06/16/19 1542    Education Details  added to HEP squats and goblet squats    Person(s) Educated  Patient    Methods  Explanation;Demonstration;Tactile cues;Verbal cues;Handout    Comprehension  Verbalized understanding;Returned demonstration       PT Short Term Goals - 06/04/19 1722      PT SHORT TERM GOAL #1   Title  STG=LTG        PT Long Term Goals - 06/04/19 1722      PT LONG TERM GOAL #1   Title  Pt will be independent with advance HEP    Time  6    Period  Weeks    Status  New    Target Date  07/16/19      PT LONG TERM GOAL #2   Title  Pt will be able to lift 25 # of groceries from floor to counter    Baseline  unable to carry groceries due to pain    Time  6    Period  Weeks    Target Date  07/16/19      PT LONG TERM GOAL #3   Title  Pt will be able to stand for 30 minutes to complete household chores without exacerbating hip pain    Time  6    Period  Weeks    Status  New    Target Date  07/16/19      PT LONG TERM GOAL #4   Title  FOTO to 42% limited to indicate significant improvement in functional ability    Baseline  67% limtation    Time  6    Period  Weeks    Status  New    Target Date  07/16/19      PT LONG TERM GOAL #5   Title  Pt will be able to drive with pain 4/23 or less in order to drive to the store more comfortably    Time  6    Period  Weeks    Status  New    Target Date  07/16/19            Plan - 06/16/19 1512    Clinical Impression Statement  Christine Roberson returns with uncontrolled leg movement/trembling to PT but does not illicit any clonus upon testing.  Pt did say her hip pain was improved with TPDN and the injection by MD last week.   She is looking forward to seeing MD neurosurgeon on Thursday to address concerns. Pt does respond well to dry needling but she does exhibit sign of muscle  atrophy in bil hips and LE's. Pt has ectomorphic body type. but demonstrates bil muscle weakness.    Personal Factors and Comorbidities  Comorbidity 1;Comorbidity 2;Past/Current Experience    Comorbidities  chronic back pain (2 surgerie of back in last year , DDD, RT TKR, bone spur off RT shld with manipulation of shld, LT knee 2 arthroscopic OA, Osteoporosis  worse in LT than RT    Examination-Activity Limitations  Stairs;Stand;Sit;Sleep;Dressing;Transfers    Examination-Participation Restrictions  Cleaning    PT Treatment/Interventions  Electrical Stimulation;Cryotherapy;Iontophoresis 4mg /ml Dexamethasone;Moist Heat;Ultrasound;Therapeutic exercise;Therapeutic activities;Neuromuscular re-education;Patient/family education;Passive range of motion;Manual techniques;Dry needling;Taping;Joint Manipulations;Spinal Manipulations    PT Next Visit Plan  assess TPDN  do squats and goblet squat    PT Home Exercise Plan  bridging and clams sidelying  [email or web address]       Patient will benefit from skilled therapeutic intervention in order to improve the following deficits and impairments:  Decreased activity tolerance, Decreased mobility, Decreased range of motion, Decreased strength, Difficulty walking, Increased muscle spasms, Pain, Obesity  Visit Diagnosis: Pain in right hip  Muscle weakness (generalized)  Difficulty in walking, not elsewhere classified     Problem List There are no problems to display for this patient.  , PT Certified Exercise Expert for the Aging Adult  06/16/19 3:56 PM Phone: [phone] Fax: [phone]  [facility] [address] [place], [place], [place] Phone: [phone]   Fax:  [phone]  Name: Christine Roberson MRN: Lisette Grinder Date of Birth: 1965/09/29

## 2019-06-18 ENCOUNTER — Other Ambulatory Visit: Payer: Self-pay

## 2019-06-18 ENCOUNTER — Ambulatory Visit: Payer: 59 | Admitting: Physical Therapy

## 2019-06-18 DIAGNOSIS — R262 Difficulty in walking, not elsewhere classified: Secondary | ICD-10-CM

## 2019-06-18 DIAGNOSIS — M6281 Muscle weakness (generalized): Secondary | ICD-10-CM

## 2019-06-18 DIAGNOSIS — M25551 Pain in right hip: Secondary | ICD-10-CM | POA: Diagnosis not present

## 2019-06-18 NOTE — Patient Instructions (Signed)
   Garen Lah, PT Certified Exercise Expert for the Aging Adult  06/18/19 11:44 AM Phone: [phone] Fax: [phone]

## 2019-06-18 NOTE — Therapy (Signed)
[facility] Outpatient Rehabilitation [facility] [address] [place], [place], [address] Phone: [phone]   Fax:  [phone]  Physical Therapy Treatment  Patient Details  Name: Stephanny Tsutsui MRN: [id number] Date of Birth: 1965-08-02 Referring Provider (PT): Romero Belling MD   Encounter Date: 06/18/2019  PT End of Session - 06/18/19 1048    Visit Number  3    Number of Visits  13    Date for PT Re-Evaluation  07/16/19    Authorization Type  [employer]HC medicare / MCD 2ndary    PT Start Time  1050    PT Stop Time  1146    PT Time Calculation (min)  56 min    Activity Tolerance  Patient limited by pain    Behavior During Therapy  [facility] for tasks assessed/performed       Past Medical History:  Diagnosis Date  . Arthritis   . Chronic back pain   . DDD (degenerative disc disease), lumbar   . Headache   . MVP (mitral valve prolapse)     Past Surgical History:  Procedure Laterality Date  . ABDOMINAL HYSTERECTOMY    . CLOSED MANIPULATION SHOULDER WITH STERIOD INJECTION Right 03/05/2017   Procedure: CLOSED MANIPULATION SHOULDER WITH STEROID INJECTION;  Surgeon: Sheral Apley, MD;  Location: [facility];  Service: Orthopedics;  Laterality: Right;  . JOINT REPLACEMENT Right    TKR  . KNEE ARTHROSCOPY Bilateral   . SPINAL CORD STIMULATOR IMPLANT    . SPINAL CORD STIMULATOR REMOVAL    . TONSILLECTOMY      There were no vitals filed for this visit.  Subjective Assessment - 06/18/19 1050    Subjective  I am seeing Dr Maurice Small today for neuro surgeon. My leg keeps spasming and I cant get it to stop unless I put my leg out put weight through the heel. I am really having trouble putting on my shoes today as well.  I was so so tired I had to lay down for a nap yesterday.  I still have pain in my RT hip but TPDN helps    Pertinent History  chronic back pain (2 surgerie of back in last year , DDD, RT TKR, bone spur off RT shld with manipulation of shld, LT  knee 2 arthroscopic OA, Osteoporosis  worse in LT than RT    Limitations  Sitting;Standing;Walking   driving   How long can you sit comfortably?  <    How long can you stand comfortably?  < 5 min    How long can you walk comfortably?  <5 min    Diagnostic tests  none recently    Patient Stated Goals  To alleviate pain, be able stand and do chores, drive without pain.  Stand and wash dishes.    Currently in Pain?  Yes    Pain Score  6     Pain Location  Hip    Pain Orientation  Right    Pain Descriptors / Indicators  Sore    Pain Type  Acute pain    Pain Radiating Towards  radiates down the back of leg into foot    Pain Onset  More than a month ago         [facility] PT Assessment - 06/18/19 0001      Deep Tendon Reflexes   DTR Assessment Site  Patella   RT decreased reflexes    Patella DTR  1+  [place] Adult PT Treatment/Exercise - 06/18/19 0001      Ambulation/Gait   Gait Comments  pt with antalgic gait due to RT hip pain/ bursitiis      Posture/Postural Control   Posture Comments  Pt with significan atrophy of muscle globally but especially in RT hip      Self-Care   Self-Care  Other Self-Care Comments    Other Self-Care Comments   importance of hydration, nutrition and sleep for health and pain control  gave FOTO report      Knee/Hip Exercises: Standing   Lateral Step Up  Left;Right;2 sets;10 reps    Lateral Step Up Limitations  with bil UE support    Forward Step Up  Right;Left;2 sets;10 reps    Forward Step Up Limitations  with bil UE support    Other Standing Knee Exercises  on elevated seat sitting on ther ex 22 inches from floor squat 2 x 8 and then goblet squat 2 x 8 with 10 lbs      Knee/Hip Exercises: Seated   Other Seated Knee/Hip Exercises  2 x 8 10 lb KB OH       Knee/Hip Exercises: Supine   Bridges  10 reps;2 sets    Single Leg Bridge  1 set;Strengthening;Right   8 x 2     Knee/Hip Exercises: Sidelying   Hip ABduction   10 reps;Right;2 sets    Hip ABduction Limitations  red t band  LT sidelying only    Other Sidelying Knee/Hip Exercises  LT sidelying with knee straight ABD 2 x 8      Moist Heat Therapy   Number Minutes Moist Heat  15 Minutes    Moist Heat Location  Hip   RT     Manual Therapy   Manual Therapy  Soft tissue mobilization;Taping    Soft tissue mobilization  gluteals and piriformis on RT STW              PT Education - 06/18/19 1200    Education Details  added to HEP  discussed importance of nutrition, sleep and hydration for pain control and health    Person(s) Educated  Patient    Methods  Explanation;Demonstration;Tactile cues;Verbal cues;Handout    Comprehension  Verbalized understanding;Returned demonstration       PT Short Term Goals - 06/04/19 1722      PT SHORT TERM GOAL #1   Title  STG=LTG        PT Long Term Goals - 06/18/19 1158      PT LONG TERM GOAL #1   Title  Pt will be independent with advance HEP    Baseline  iniital HEP given    Time  6    Period  Weeks    Status  On-going      PT LONG TERM GOAL #2   Title  Pt will be able to lift 25 # of groceries from floor to counter    Baseline  Pt fatigues carrying 10 lb DB    Time  6    Period  Weeks    Status  On-going      PT LONG TERM GOAL #3   Title  Pt will be able to stand for 30 minutes to complete household chores without exacerbating hip pain    Baseline  6/10 pain today    Time  6    Period  Weeks    Status  On-going      PT LONG TERM  GOAL #4   Title  FOTO to 42% limited to indicate significant improvement in functional ability    Baseline  67% limtation on eval    Time  6    Period  Weeks    Status  On-going      PT LONG TERM GOAL #5   Title  Pt will be able to drive with pain 1/61 or less in order to drive to the store more comfortably    Baseline  Pt 6/10 pain today    Time  6    Period  Weeks    Status  On-going            Plan - 06/18/19 1123    Clinical Impression  Statement  Pt returns to clinic today 6/10  She reports she has lost weight in last year unintensionally from 110 to 88 lb.  She has visible muscle wasting and muscle fasciculations of RT LE, She is has uncontrollable movement of Rt LE. and decreased reflexes or Rt compared to LT. Pt reports she is tired of her uncontrollable leg movement. and muscle weakness.   PT is concerned about her rapid weight loss in past year and will defer to MD. Will continue POC or change as deemed necessary by MD    Personal Factors and Comorbidities  Comorbidity 1;Comorbidity 2;Past/Current Experience    Comorbidities  chronic back pain (2 surgerie of back in last year , DDD, RT TKR, bone spur off RT shld with manipulation of shld, LT knee 2 arthroscopic OA, Osteoporosis  worse in LT than RT    Examination-Activity Limitations  Stairs;Stand;Sit;Sleep;Dressing;Transfers    Examination-Participation Restrictions  Cleaning    Stability/Clinical Decision Making  Evolving/Moderate complexity    Clinical Decision Making  Moderate    Rehab Potential  Good    PT Frequency  2x / week    PT Duration  6 weeks    PT Treatment/Interventions  Electrical Stimulation;Cryotherapy;Iontophoresis 4mg /ml Dexamethasone;Moist Heat;Ultrasound;Therapeutic exercise;Therapeutic activities;Neuromuscular re-education;Patient/family education;Passive range of motion;Manual techniques;Dry needling;Taping;Joint Manipulations;Spinal Manipulations    PT Next Visit Plan  check with what neurosurgeon wants to do with Ms Jons.  Continue hip bursities. TPDN and strengthening    PT Home Exercise Plan  bridging and clams sidelying  FVCDTRCCURL    Consulted and Agree with Plan of Care  Patient       Patient will benefit from skilled therapeutic intervention in order to improve the following deficits and impairments:  Decreased activity tolerance, Decreased mobility, Decreased range of motion, Decreased strength, Difficulty walking, Increased muscle spasms,  Pain, Obesity  Visit Diagnosis: Pain in right hip  Muscle weakness (generalized)  Difficulty in walking, not elsewhere classified     Problem List There are no problems to display for this patient.   Smitty Cords, PT Certified Exercise Expert for the Aging Adult  06/18/19 12:12 PM Phone: [phone] Fax: [phone]  [facility] Outpatient Rehabilitation [facility] [address] [place], [place], [place] Phone: [phone]   Fax:  [phone]  Name: Kale Dols MRN: Lisette Grinder Date of Birth: 06-12-1965

## 2019-06-22 ENCOUNTER — Ambulatory Visit: Payer: 59 | Admitting: Physical Therapy

## 2019-06-22 ENCOUNTER — Other Ambulatory Visit: Payer: Self-pay

## 2019-06-22 ENCOUNTER — Encounter: Payer: Self-pay | Admitting: Physical Therapy

## 2019-06-22 DIAGNOSIS — M25551 Pain in right hip: Secondary | ICD-10-CM

## 2019-06-22 DIAGNOSIS — R262 Difficulty in walking, not elsewhere classified: Secondary | ICD-10-CM

## 2019-06-22 DIAGNOSIS — M6281 Muscle weakness (generalized): Secondary | ICD-10-CM

## 2019-06-22 NOTE — Therapy (Signed)
[facility] Outpatient Rehabilitation [facility] [address] [place], [place], [address] Phone: [phone]   Fax:  [phone]  Physical Therapy Treatment  Patient Details  Name: Christine Roberson MRN: [id number] Date of Birth: 02-19-1966 Referring Provider (PT): Romero Belling MD   Encounter Date: 06/22/2019  PT End of Session - 06/22/19 1213    Visit Number  4    Number of Visits  13    Date for PT Re-Evaluation  07/16/19    Authorization Type  [employer]HC medicare / MCD 2ndary    PT Start Time  1145    PT Stop Time  1230    PT Time Calculation (min)  45 min    Activity Tolerance  Patient tolerated treatment well    Behavior During Therapy  [facility] for tasks assessed/performed       Past Medical History:  Diagnosis Date  . Arthritis   . Chronic back pain   . DDD (degenerative disc disease), lumbar   . Headache   . MVP (mitral valve prolapse)     Past Surgical History:  Procedure Laterality Date  . ABDOMINAL HYSTERECTOMY    . CLOSED MANIPULATION SHOULDER WITH STERIOD INJECTION Right 03/05/2017   Procedure: CLOSED MANIPULATION SHOULDER WITH STEROID INJECTION;  Surgeon: Sheral Apley, MD;  Location: [facility];  Service: Orthopedics;  Laterality: Right;  . JOINT REPLACEMENT Right    TKR  . KNEE ARTHROSCOPY Bilateral   . SPINAL CORD STIMULATOR IMPLANT    . SPINAL CORD STIMULATOR REMOVAL    . TONSILLECTOMY      There were no vitals filed for this visit.  Subjective Assessment - 06/22/19 1143    Subjective  Pain over the Rt. hip where the bursitis is. Has found a sore spot over the medial side of the Rt. knee. Still reports difficulty putting weight through legs and heels. Dr. Marcia Brash is sending her to an orthopedic and nutritionist. My leg tires throughout the day    Pertinent History  chronic back pain (2 surgerie of back in last year , DDD, RT TKR, bone spur off RT shld with manipulation of shld, LT knee 2 arthroscopic OA, Osteoporosis  worse in  LT than RT    Limitations  Sitting;Standing;Walking    How long can you sit comfortably?  Can sit with legs extended for 15 mins    How long can you stand comfortably?  Can stand for 10 mins, but varies from day to day    How long can you walk comfortably?  <5 min    Diagnostic tests  none recently    Patient Stated Goals  To alleviate pain, be able stand and do chores, drive without pain.  Stand and wash dishes.    Currently in Pain?  Yes    Pain Score  6     Pain Location  Hip    Pain Orientation  Right    Pain Descriptors / Indicators  Aching    Pain Type  Chronic pain    Pain Radiating Towards  radiates down the back of the leg    Pain Onset  More than a month ago    Pain Frequency  Constant    Aggravating Factors   Pain is worse when shaking in the leg is significant    Pain Relieving Factors  ice, heat, topical pain relief (lidocaine)                       [facility]  Adult PT Treatment/Exercise - 06/22/19 0001      Exercises   Exercises  Knee/Hip      Knee/Hip Exercises: Stretches   Active Hamstring Stretch Limitations  supine from 90/90    Piriformis Stretch Limitations  hooklying pull across      Knee/Hip Exercises: Aerobic   Nustep  L2 UE & LE 8 min      Knee/Hip Exercises: Supine   Bridges  Both;Strengthening;2 sets;10 reps    Bridges Limitations  fatigue after 12 reps    Other Supine Knee/Hip Exercises  supine clam red tband with core engagement    Other Supine Knee/Hip Exercises  hooklying march; TT hold to [facility]             PT Education - 06/22/19 1233    Education Details  anatomy of condition: DDD, h/o multiple surgeries, nutrition; exercise form/rationale    Person(s) Educated  Patient    Methods  Explanation;Handout    Comprehension  Verbalized understanding;Other (comment)       PT Short Term Goals - 06/04/19 1722      PT SHORT TERM GOAL #1   Title  STG=LTG        PT Long Term Goals - 06/18/19 1158      PT LONG TERM GOAL #1    Title  Pt will be independent with advance HEP    Baseline  iniital HEP given    Time  6    Period  Weeks    Status  On-going      PT LONG TERM GOAL #2   Title  Pt will be able to lift 25 # of groceries from floor to counter    Baseline  Pt fatigues carrying 10 lb DB    Time  6    Period  Weeks    Status  On-going      PT LONG TERM GOAL #3   Title  Pt will be able to stand for 30 minutes to complete household chores without exacerbating hip pain    Baseline  6/10 pain today    Time  6    Period  Weeks    Status  On-going      PT LONG TERM GOAL #4   Title  FOTO to 42% limited to indicate significant improvement in functional ability    Baseline  67% limtation on eval    Time  6    Period  Weeks    Status  On-going      PT LONG TERM GOAL #5   Title  Pt will be able to drive with pain 2/69 or less in order to drive to the store more comfortably    Baseline  Pt 6/10 pain today    Time  6    Period  Weeks    Status  On-going            Plan - 06/22/19 1235    Clinical Impression Statement  Today the patient reports 6/10 pain. She continues to present with visible muscle wasting and fasciculations. She reports that her Rt. hip pain continues due to bursitis, and is worse when her leg is fatigued. The patient indicated that she has Rt. medial knee pain after bumping into an object. She presents with a flexion bias and her tremors decreased with the leg extended and in supine with the knees flexed. Patient would benefit from skilled PT to help with muscle strengthening and deficits.    Personal Factors and  Comorbidities  Comorbidity 1;Comorbidity 2;Past/Current Experience    Comorbidities  chronic back pain (2 surgerie of back in last year , DDD, RT TKR, bone spur off RT shld with manipulation of shld, LT knee 2 arthroscopic OA, Osteoporosis  worse in LT than RT    Examination-Activity Limitations  Stairs;Stand;Sit;Sleep;Dressing;Transfers    Examination-Participation  Restrictions  Cleaning    PT Treatment/Interventions  Electrical Stimulation;Cryotherapy;Iontophoresis 4mg /ml Dexamethasone;Moist Heat;Ultrasound;Therapeutic exercise;Therapeutic activities;Neuromuscular re-education;Patient/family education;Passive range of motion;Manual techniques;Dry needling;Taping;Joint Manipulations;Spinal Manipulations    PT Next Visit Plan  Continue hip bursities. TPDN and strengthening, Follow-up on hip flexion exercises    PT Home Exercise Plan  bridging and clams sidelying  FVCDTRCCURL, supine abdominal bracing, supine double knee to chest, supine hamstring stretch, supine piriformis stretch    Consulted and Agree with Plan of Care  Patient       Patient will benefit from skilled therapeutic intervention in order to improve the following deficits and impairments:  Decreased activity tolerance, Decreased mobility, Decreased range of motion, Decreased strength, Difficulty walking, Increased muscle spasms, Pain, Obesity  Visit Diagnosis: Pain in right hip  Muscle weakness (generalized)  Difficulty in walking, not elsewhere classified     Problem List There are no problems to display for this patient.   Kashif Pooler C. Sylus Stgermain PT, DPT 06/22/19 12:52 PM   [facility] Outpatient Rehabilitation [facility] [address] [place], [place], [place] Phone: [phone]   Fax:  [phone]  Name: Prestina Raigoza MRN: Lisette Grinder Date of Birth: 1965-12-31

## 2019-06-24 ENCOUNTER — Other Ambulatory Visit: Payer: Self-pay

## 2019-06-24 ENCOUNTER — Encounter: Payer: Self-pay | Admitting: Physical Therapy

## 2019-06-24 ENCOUNTER — Ambulatory Visit: Payer: 59 | Admitting: Physical Therapy

## 2019-06-24 DIAGNOSIS — M6281 Muscle weakness (generalized): Secondary | ICD-10-CM

## 2019-06-24 DIAGNOSIS — M25551 Pain in right hip: Secondary | ICD-10-CM | POA: Diagnosis not present

## 2019-06-24 DIAGNOSIS — R262 Difficulty in walking, not elsewhere classified: Secondary | ICD-10-CM

## 2019-06-24 NOTE — Therapy (Signed)
[facility] Outpatient Rehabilitation [facility] [address] [place], [place], [address] Phone: [phone]   Fax:  [phone]  Physical Therapy Treatment  Patient Details  Name: Christine Roberson MRN: [id number] Date of Birth: Jul 18, 1965 Referring Provider (PT): Romero Belling MD   Encounter Date: 06/24/2019  PT End of Session - 06/24/19 1036    Visit Number  5    Number of Visits  13    Date for PT Re-Evaluation  07/16/19    Authorization Type  [employer]HC medicare / MCD 2ndary    PT Start Time  1036    PT Stop Time  1120    PT Time Calculation (min)  44 min    Activity Tolerance  Patient tolerated treatment well    Behavior During Therapy  [facility] for tasks assessed/performed       Past Medical History:  Diagnosis Date  . Arthritis   . Chronic back pain   . DDD (degenerative disc disease), lumbar   . Headache   . MVP (mitral valve prolapse)     Past Surgical History:  Procedure Laterality Date  . ABDOMINAL HYSTERECTOMY    . CLOSED MANIPULATION SHOULDER WITH STERIOD INJECTION Right 03/05/2017   Procedure: CLOSED MANIPULATION SHOULDER WITH STEROID INJECTION;  Surgeon: Sheral Apley, MD;  Location: [facility];  Service: Orthopedics;  Laterality: Right;  . JOINT REPLACEMENT Right    TKR  . KNEE ARTHROSCOPY Bilateral   . SPINAL CORD STIMULATOR IMPLANT    . SPINAL CORD STIMULATOR REMOVAL    . TONSILLECTOMY      There were no vitals filed for this visit.  Subjective Assessment - 06/24/19 1036    Subjective  Patient reports that she had an ortho appointment this morning, and feels as if it did not go well. She states that her Rt. hip is sore into the hip joint and and knee. She indicates that her flexion based exercises have helped her to slightly control her tremors.    Pertinent History  chronic back pain (2 surgerie of back in last year , DDD, RT TKR, bone spur off RT shld with manipulation of shld, LT knee 2 arthroscopic OA, Osteoporosis  worse in  LT than RT    Limitations  Sitting;Standing;Walking    How long can you sit comfortably?  Can sit with legs extended for 15 mins    How long can you stand comfortably?  Can stand for 10 mins, but varies from day to day    How long can you walk comfortably?  <5 min    Diagnostic tests  none recently    Patient Stated Goals  To alleviate pain, be able stand and do chores, drive without pain.  Stand and wash dishes.    Currently in Pain?  Yes    Pain Score  5     Pain Location  Hip    Pain Orientation  Right    Pain Descriptors / Indicators  Aching    Pain Type  Chronic pain    Pain Radiating Towards  radiates down the back of the leg    Pain Frequency  Constant    Aggravating Factors   Pain is worse when leg is fatgued    Pain Relieving Factors  Ice, heat, topical pain relief (lidocaine)                       [facility] Adult PT Treatment/Exercise - 06/24/19 0001      Knee/Hip  Exercises: Supine   Single Leg Bridge  2 sets;Both;10 reps   ;10 reps    Other Supine Knee/Hip Exercises  Double knee flexion    Other Supine Knee/Hip Exercises  Core Progression Marches      Knee/Hip Exercises: Sidelying   Hip ABduction  10 reps;2 sets;Both    Other Sidelying Knee/Hip Exercises  Alternating Clamshells      Manual Therapy   Manual Therapy  Joint mobilization    Joint Mobilization  LAD Grade I and II on the Rt side             PT Education - 06/24/19 1138    Education Details  Centralization, muscle fatigue and benefits of strengthening    Person(s) Educated  Patient    Methods  Explanation    Comprehension  Verbalized understanding;Need further instruction       PT Short Term Goals - 06/04/19 1722      PT SHORT TERM GOAL #1   Title  STG=LTG        PT Long Term Goals - 06/18/19 1158      PT LONG TERM GOAL #1   Title  Pt will be independent with advance HEP    Baseline  iniital HEP given    Time  6    Period  Weeks    Status  On-going      PT LONG TERM  GOAL #2   Title  Pt will be able to lift 25 # of groceries from floor to counter    Baseline  Pt fatigues carrying 10 lb DB    Time  6    Period  Weeks    Status  On-going      PT LONG TERM GOAL #3   Title  Pt will be able to stand for 30 minutes to complete household chores without exacerbating hip pain    Baseline  6/10 pain today    Time  6    Period  Weeks    Status  On-going      PT LONG TERM GOAL #4   Title  FOTO to 42% limited to indicate significant improvement in functional ability    Baseline  67% limtation on eval    Time  6    Period  Weeks    Status  On-going      PT LONG TERM GOAL #5   Title  Pt will be able to drive with pain 4/65 or less in order to drive to the store more comfortably    Baseline  Pt 6/10 pain today    Time  6    Period  Weeks    Status  On-going            Plan - 06/24/19 1124    Clinical Impression Statement  The patient reports that she has responded favorably to flexion based exercises. She notes centralization with radiating hip pain. She was able to perform all of her flexion and strengthening exercises. She reports tightness in the adductor muscles and groin pain. PT notes crepitus in the right hip joint. She indicated pain relief with grade I and II LAD. Patient would benefit from PT in order to improve centralization and to address strength deficits.    PT Treatment/Interventions  Electrical Stimulation;Cryotherapy;Iontophoresis 4mg /ml Dexamethasone;Moist Heat;Ultrasound;Therapeutic exercise;Therapeutic activities;Neuromuscular re-education;Patient/family education;Passive range of motion;Manual techniques;Dry needling;Taping;Joint Manipulations;Spinal Manipulations    PT Next Visit Plan  Continue hip bursities. TPDN and strengthening, Follow-up on hip flexion exercises  PT Home Exercise Plan  bridging and clams sidelying  FVCDTRCCURL, supine abdominal bracing, supine double knee to chest, supine hamstring stretch, supine piriformis  stretch       Patient will benefit from skilled therapeutic intervention in order to improve the following deficits and impairments:  Decreased activity tolerance, Decreased mobility, Decreased range of motion, Decreased strength, Difficulty walking, Increased muscle spasms, Pain, Obesity  Visit Diagnosis: Pain in right hip  Muscle weakness (generalized)  Difficulty in walking, not elsewhere classified     Problem List There are no problems to display for this patient.  Melissaann Dizdarevic C. Alexzandrea Normington PT, DPT 06/24/19 11:46 AM   During this treatment session, the therapist was present, participating in and directing the treatment.   Laveda Norman, SPT  [facility] [address] [place], [place], [address] Phone: [phone]   Fax:  [phone]  Name: Christine Roberson MRN: [id number] Date of Birth: Apr 08, 1965

## 2019-06-29 ENCOUNTER — Other Ambulatory Visit: Payer: Self-pay

## 2019-06-29 ENCOUNTER — Encounter: Payer: Self-pay | Admitting: Physical Therapy

## 2019-06-29 ENCOUNTER — Ambulatory Visit: Payer: 59 | Admitting: Physical Therapy

## 2019-06-29 DIAGNOSIS — M25551 Pain in right hip: Secondary | ICD-10-CM | POA: Diagnosis not present

## 2019-06-29 DIAGNOSIS — R262 Difficulty in walking, not elsewhere classified: Secondary | ICD-10-CM

## 2019-06-29 DIAGNOSIS — M6281 Muscle weakness (generalized): Secondary | ICD-10-CM

## 2019-06-29 NOTE — Therapy (Signed)
[facility] Outpatient Rehabilitation [facility] [address] [place], [place], [address] Phone: [phone]   Fax:  [phone]  Physical Therapy Treatment  Patient Details  Name: Christine Roberson MRN: [id number] Date of Birth: 07-09-1965 Referring Provider (PT): Romero Belling MD   Encounter Date: 06/29/2019  PT End of Session - 06/29/19 1146    Visit Number  6    Number of Visits  13    Date for PT Re-Evaluation  07/16/19    Authorization Type  [employer] medicare / MCD 2ndary    PT Start Time  1147    PT Stop Time  1236    PT Time Calculation (min)  49 min    Activity Tolerance  Patient limited by pain    Behavior During Therapy  [facility] for tasks assessed/performed       Past Medical History:  Diagnosis Date  . Arthritis   . Chronic back pain   . DDD (degenerative disc disease), lumbar   . Headache   . MVP (mitral valve prolapse)     Past Surgical History:  Procedure Laterality Date  . ABDOMINAL HYSTERECTOMY    . CLOSED MANIPULATION SHOULDER WITH STERIOD INJECTION Right 03/05/2017   Procedure: CLOSED MANIPULATION SHOULDER WITH STEROID INJECTION;  Surgeon: Sheral Apley, MD;  Location: [facility];  Service: Orthopedics;  Laterality: Right;  . JOINT REPLACEMENT Right    TKR  . KNEE ARTHROSCOPY Bilateral   . SPINAL CORD STIMULATOR IMPLANT    . SPINAL CORD STIMULATOR REMOVAL    . TONSILLECTOMY      There were no vitals filed for this visit.  Subjective Assessment - 06/29/19 1149    Subjective  Patient reports that her pain has continued to centralize and her tremors have decreased, but the pain is intense in her hip and groin region. She reports that the flexion exercises have been helping her, but her muscle feel tired. Patient reports that she hit her big toe and it has been causing pain in her foot.    Pertinent History  chronic back pain (2 surgerie of back in last year , DDD, RT TKR, bone spur off RT shld with manipulation of shld, LT knee  2 arthroscopic OA, Osteoporosis  worse in LT than RT    Limitations  Sitting;Standing;Walking    How long can you sit comfortably?  Can sit with legs extended for 15 mins    How long can you stand comfortably?  Can stand for 10 mins, but varies from day to day    How long can you walk comfortably?  <5 min    Diagnostic tests  none recently    Patient Stated Goals  To alleviate pain, be able stand and do chores, drive without pain.  Stand and wash dishes.    Currently in Pain?  Yes    Pain Score  6     Pain Location  Hip    Pain Orientation  Right    Pain Descriptors / Indicators  Aching    Pain Onset  More than a month ago    Pain Frequency  Constant    Aggravating Factors   Pain is worse with fatigued  and believes that her pain is worse with adverse weather.    Pain Relieving Factors  Ice, heat, topical pain relief                       [facility] Adult PT Treatment/Exercise - 06/29/19 0001  Exercises   Exercises  Other Exercises   Dead Bug    Other Exercises   Double Knee Flexion, Table top Knee kicks       Knee/Hip Exercises: Stretches   Passive Hamstring Stretch  Both;1 rep;10 seconds    Other Knee/Hip Stretches  Glute Stretch      Knee/Hip Exercises: Sidelying   Hip ABduction  Both;2 sets;10 reps    Other Sidelying Knee/Hip Exercises  --      Manual Therapy   Manual Therapy  Joint mobilization    Joint Mobilization  LAD Grade I and II on Rt. Side, AP Grade I at hip    Soft tissue mobilization  gluteals and piriformis on RT STW              PT Education - 06/29/19 1335    Education Details  centralizing pain, rationale for manual therapy. importance of stretching, home TENS instruction    Person(s) Educated  Patient    Methods  Explanation;Handout    Comprehension  Verbalized understanding;Need further instruction       PT Short Term Goals - 06/04/19 1722      PT SHORT TERM GOAL #1   Title  STG=LTG        PT Long Term Goals - 06/18/19  1158      PT LONG TERM GOAL #1   Title  Pt will be independent with advance HEP    Baseline  iniital HEP given    Time  6    Period  Weeks    Status  On-going      PT LONG TERM GOAL #2   Title  Pt will be able to lift 25 # of groceries from floor to counter    Baseline  Pt fatigues carrying 10 lb DB    Time  6    Period  Weeks    Status  On-going      PT LONG TERM GOAL #3   Title  Pt will be able to stand for 30 minutes to complete household chores without exacerbating hip pain    Baseline  6/10 pain today    Time  6    Period  Weeks    Status  On-going      PT LONG TERM GOAL #4   Title  FOTO to 42% limited to indicate significant improvement in functional ability    Baseline  67% limtation on eval    Time  6    Period  Weeks    Status  On-going      PT LONG TERM GOAL #5   Title  Pt will be able to drive with pain 8/52 or less in order to drive to the store more comfortably    Baseline  Pt 6/10 pain today    Time  6    Period  Weeks    Status  On-going            Plan - 06/29/19 1237    Clinical Impression Statement  The patient's pain has centralized, but intense pain in her groin and hip region limited her ability to complete exercises. Grade I and II LAD  and APs at the hip were performed, to which she responded favorably. She has a lot of tightness in her adductor, and gluteal region. Patient would benefit from PT to continue to address her hip pain and strength deficits.    Personal Factors and Comorbidities  Comorbidity 1;Comorbidity 2;Past/Current Experience  Comorbidities  chronic back pain (2 surgerie of back in last year , DDD, RT TKR, bone spur off RT shld with manipulation of shld, LT knee 2 arthroscopic OA, Osteoporosis  worse in LT than RT    Examination-Activity Limitations  Stairs;Stand;Sit;Sleep;Dressing;Transfers    Examination-Participation Restrictions  Cleaning    Stability/Clinical Decision Making  Evolving/Moderate complexity    Clinical  Decision Making  Moderate    Rehab Potential  Good    PT Frequency  2x / week    PT Duration  6 weeks    PT Treatment/Interventions  Electrical Stimulation;Cryotherapy;Iontophoresis 4mg /ml Dexamethasone;Moist Heat;Ultrasound;Therapeutic exercise;Therapeutic activities;Neuromuscular re-education;Patient/family education;Passive range of motion;Manual techniques;Dry needling;Taping;Joint Manipulations;Spinal Manipulations    PT Next Visit Plan  Continue hip bursities. TPDN and strengthening, Follow-up on hip flexion exercises and stretches    PT Home Exercise Plan  bridging and clams sidelying  [id number], supine abdominal bracing, supine double knee to chest, supine hamstring stretch, supine piriformis and hip flexor stretches    Consulted and Agree with Plan of Care  Patient       Patient will benefit from skilled therapeutic intervention in order to improve the following deficits and impairments:  Decreased activity tolerance, Decreased mobility, Decreased range of motion, Decreased strength, Difficulty walking, Increased muscle spasms, Pain, Obesity  Visit Diagnosis: Pain in right hip  Muscle weakness (generalized)  Difficulty in walking, not elsewhere classified     Problem List There are no problems to display for this patient.  , SPT Cynda Soule C. Nicola Heinemann PT, DPT 06/29/19 1:36 PM  During this treatment session, the therapist was present, participating in and directing the treatment.  [facility] Outpatient Rehabilitation [facility] [address] [place], [place], [place] Phone: [phone]   Fax:  [phone]  Name: Christine Roberson MRN: Lisette Grinder Date of Birth: 1966/02/04

## 2019-07-01 ENCOUNTER — Encounter: Payer: Self-pay | Admitting: Physical Therapy

## 2019-07-01 ENCOUNTER — Ambulatory Visit: Payer: 59 | Admitting: Physical Therapy

## 2019-07-01 ENCOUNTER — Encounter: Payer: Self-pay | Admitting: Neurology

## 2019-07-01 ENCOUNTER — Other Ambulatory Visit: Payer: Self-pay

## 2019-07-01 DIAGNOSIS — R262 Difficulty in walking, not elsewhere classified: Secondary | ICD-10-CM

## 2019-07-01 DIAGNOSIS — M25551 Pain in right hip: Secondary | ICD-10-CM | POA: Diagnosis not present

## 2019-07-01 DIAGNOSIS — M6281 Muscle weakness (generalized): Secondary | ICD-10-CM

## 2019-07-01 NOTE — Therapy (Signed)
[facility] Outpatient Rehabilitation [facility] [address] [place], [place], [address] Phone: [phone]   Fax:  [phone]  Physical Therapy Treatment  Patient Details  Name: Christine Roberson MRN: [id number] Date of Birth: 07-May-1965 Referring Provider (PT): Romero Belling MD   Encounter Date: 07/01/2019  PT End of Session - 07/01/19 1037    Visit Number  7    Number of Visits  13    Date for PT Re-Evaluation  07/16/19    Authorization Type  [employer] medicare / MCD 2ndary    PT Start Time  1038    PT Stop Time  1123    PT Time Calculation (min)  45 min    Activity Tolerance  Patient limited by pain    Behavior During Therapy  [facility] for tasks assessed/performed       Past Medical History:  Diagnosis Date  . Arthritis   . Chronic back pain   . DDD (degenerative disc disease), lumbar   . Headache   . MVP (mitral valve prolapse)     Past Surgical History:  Procedure Laterality Date  . ABDOMINAL HYSTERECTOMY    . CLOSED MANIPULATION SHOULDER WITH STERIOD INJECTION Right 03/05/2017   Procedure: CLOSED MANIPULATION SHOULDER WITH STEROID INJECTION;  Surgeon: Sheral Apley, MD;  Location: [facility];  Service: Orthopedics;  Laterality: Right;  . JOINT REPLACEMENT Right    TKR  . KNEE ARTHROSCOPY Bilateral   . SPINAL CORD STIMULATOR IMPLANT    . SPINAL CORD STIMULATOR REMOVAL    . TONSILLECTOMY      There were no vitals filed for this visit.  Subjective Assessment - 07/01/19 1037    Subjective  Patient reports that she feels better than last time. She has been using her TENS unit and it has been helping. She reports that she has been doing her HEP. The exercises and TENS combined has been decreasing her tremors. She doesn't have any further centralization    Pertinent History  chronic back pain (2 surgerie of back in last year , DDD, RT TKR, bone spur off RT shld with manipulation of shld, LT knee 2 arthroscopic OA, Osteoporosis  worse in LT than  RT    Limitations  Sitting;Standing;Walking    Patient Stated Goals  To alleviate pain, be able stand and do chores, drive without pain.  Stand and wash dishes.    Pain Score  4     Pain Location  Hip    Pain Orientation  Right    Pain Descriptors / Indicators  Aching    Pain Type  Chronic pain    Pain Radiating Towards  No longer radiating towards the lower leg    Pain Onset  More than a month ago    Pain Frequency  Constant    Multiple Pain Sites  No                       [facility] Adult PT Treatment/Exercise - 07/01/19 0001      Exercises   Exercises  Knee/Hip    Other Exercises   Double Knee Flexion x 10, Table top Knee kicks both legs x 10      Pilates   Pilates Reformer  Supine Leg presses, Supine overhead flexion and extension      Knee/Hip Exercises: Stretches   Passive Hamstring Stretch  Both;1 rep;10 seconds    Other Knee/Hip Stretches  Glute Stretch      Knee/Hip Exercises: Sidelying  Hip ABduction  Both;2 sets;10 reps      Moist Heat Therapy   Number Minutes Moist Heat  10 Minutes    Moist Heat Location  Hip;Other (comment)   Glutes     Manual Therapy   Manual Therapy  Joint mobilization    Joint Mobilization  LAD Grade I and II on Rt. Side, AP Grade I at hip    Soft tissue mobilization  gluteals and piriformis on RT STW                PT Short Term Goals - 06/04/19 1722      PT SHORT TERM GOAL #1   Title  STG=LTG        PT Long Term Goals - 06/18/19 1158      PT LONG TERM GOAL #1   Title  Pt will be independent with advance HEP    Baseline  iniital HEP given    Time  6    Period  Weeks    Status  On-going      PT LONG TERM GOAL #2   Title  Pt will be able to lift 25 # of groceries from floor to counter    Baseline  Pt fatigues carrying 10 lb DB    Time  6    Period  Weeks    Status  On-going      PT LONG TERM GOAL #3   Title  Pt will be able to stand for 30 minutes to complete household chores without exacerbating  hip pain    Baseline  6/10 pain today    Time  6    Period  Weeks    Status  On-going      PT LONG TERM GOAL #4   Title  FOTO to 42% limited to indicate significant improvement in functional ability    Baseline  67% limtation on eval    Time  6    Period  Weeks    Status  On-going      PT LONG TERM GOAL #5   Title  Pt will be able to drive with pain 2/11 or less in order to drive to the store more comfortably    Baseline  Pt 6/10 pain today    Time  6    Period  Weeks    Status  On-going            Plan - 07/01/19 1129    Clinical Impression Statement  Patient presents to the clinic with decreased hip pain. She frequently requests Grade I and II LAD. She often stretches her piriformis throughout her session, which provides her some relief. She was able to perform some exercises on the reformer, but her muscles fatigued quickly. She continues to have crepitus at the hip joint and describes pain consistent with OA. There is a visual decrease in tremors during her session and while using the TENS unit. Patient would benefit from further strengthening exercises to address functional limitations    Personal Factors and Comorbidities  Comorbidity 1;Comorbidity 2;Past/Current Experience    Comorbidities  chronic back pain (2 surgerie of back in last year , DDD, RT TKR, bone spur off RT shld with manipulation of shld, LT knee 2 arthroscopic OA, Osteoporosis  worse in LT than RT    Examination-Activity Limitations  Stairs;Stand;Sit;Sleep;Dressing;Transfers    Examination-Participation Restrictions  Cleaning    Stability/Clinical Decision Making  Evolving/Moderate complexity    Clinical Decision Making  Moderate  Rehab Potential  Good    PT Frequency  2x / week    PT Duration  6 weeks    PT Treatment/Interventions  Electrical Stimulation;Cryotherapy;Iontophoresis 4mg /ml Dexamethasone;Moist Heat;Ultrasound;Therapeutic exercise;Therapeutic activities;Neuromuscular  re-education;Patient/family education;Passive range of motion;Manual techniques;Dry needling;Taping;Joint Manipulations;Spinal Manipulations    PT Next Visit Plan  Continue hip bursities. TPDN and strengthening, Follow-up on hip flexion exercises and stretches, Reformer    PT Home Exercise Plan  bridging and clams sidelying  FVCDTRCCURL, supine abdominal bracing, supine double knee to chest, supine hamstring stretch, supine piriformis and hip flexor stretches       Patient will benefit from skilled therapeutic intervention in order to improve the following deficits and impairments:  Decreased activity tolerance, Decreased mobility, Decreased range of motion, Decreased strength, Difficulty walking, Increased muscle spasms, Pain, Obesity  Visit Diagnosis: Pain in right hip  Muscle weakness (generalized)  Difficulty in walking, not elsewhere classified     Problem List There are no problems to display for this patient.   Laveda Norman, SPT 07/01/2019, 12:01 PM  [facility] [address] [place], [place], [address] Phone: [phone]   Fax:  [phone]  Name: Christine Roberson MRN: [id number] Date of Birth: 06-04-1965

## 2019-07-02 ENCOUNTER — Telehealth: Payer: Self-pay | Admitting: Neurology

## 2019-07-02 ENCOUNTER — Encounter: Payer: Self-pay | Admitting: Neurology

## 2019-07-02 ENCOUNTER — Ambulatory Visit (INDEPENDENT_AMBULATORY_CARE_PROVIDER_SITE_OTHER): Payer: 59 | Admitting: Neurology

## 2019-07-02 VITALS — BP 126/75 | HR 83 | Ht 68.0 in | Wt 89.5 lb

## 2019-07-02 DIAGNOSIS — R251 Tremor, unspecified: Secondary | ICD-10-CM

## 2019-07-02 NOTE — Patient Instructions (Addendum)
I am not sure how to explain your leg trembling.  There are no signs of Parkinson's-like changes thankfully.  You do not have any other trauma.  I would like to proceed with a brain MRI with and without contrast to rule out a stroke or structural cause of your trembling.  We will call you with the test results and follow-up if needed.  I do not have any additional recommendations for testing, I do not believe that the trembling is coming from a pinched nerve in the back.  Your history and examination is also not suggestive of restless leg syndrome.

## 2019-07-02 NOTE — Progress Notes (Addendum)
Subjective:    Patient ID: Mikalyn Hermida is a 54 y.o. female.  HPI     Star Age, MD, PhD [facility] [address] P.O[address] [place], [place] [address]  Dear Dr. Zada Finders,   I saw your patient, Ishanvi Mcquitty upon your kind request in my neurologic clinic today for initial consultation of her tremor affecting the R leg.  The patient is unaccompanied today.  As you know, Ms. Strider is a 54 year old 07/27/2019: addendum: 65 woman with an underlying medical history of chronic back pain, hip pain, degenerative disc disease, hypertension (patient reports no diagnosis of HTN, 07/27/2019), headache, mitral valve prolapse, and arthritis, who reports a right foot trembling for the past 1-1/2 years.  It went away after her first back surgery and restarted, then stopped after her second back surgery she states and restarted about 2 months after her last back surgery.  It improves when she stretches her leg out, it is not apparent when she lays in bed, it goes away when she walks.  She feels that it is bothersome.  She denies any numbness or tingling in the legs or feet or hands.  She denies any sudden onset of one-sided weakness or numbness or tingling or droopy face or slurring of speech.  She has had low weight for some time, she sees her primary care provider on a regular basis.  She apparently had an EMG and nerve conduction test which did not show any obvious abnormality according to your office note.  I reviewed your office note from 06/18/2019.  She reports no FHx of tremor.  She had a CT of the cervical and lumbar spine with contrast on 11/18/2018 and I reviewed the results: IMPRESSION: Cervical spine:   1. Multilevel cervical spondylosis as described above. Moderate to severe right and mild left neuroforaminal stenosis at C6-C7 due to uncovertebral hypertrophy. No spinal canal stenosis at any level.   Lumbar spine:   1. Interval left hemilaminectomy at  L4-L5 with continued moderate bilateral lateral recess stenosis. Effacement of the descending right L5 nerve root in the lateral recess is more pronounced on the myelogram images when compared to the prior study from January 2020.  Corrections: 07/27/2019: patient is left handed and has no History of hypertension.   Her Past Medical History Is Significant For: Past Medical History:  Diagnosis Date  . Arthritis   . Chronic back pain   . DDD (degenerative disc disease), lumbar   . Headache   . Hypertension   . MVP (mitral valve prolapse)     Her Past Surgical History Is Significant For: Past Surgical History:  Procedure Laterality Date  . ABDOMINAL HYSTERECTOMY    . CLOSED MANIPULATION SHOULDER WITH STERIOD INJECTION Right 03/05/2017   Procedure: CLOSED MANIPULATION SHOULDER WITH STEROID INJECTION;  Surgeon: Renette Butters, MD;  Location: [facility];  Service: Orthopedics;  Laterality: Right;  . JOINT REPLACEMENT Right    TKR  . KNEE ARTHROSCOPY Bilateral   . SPINAL CORD STIMULATOR IMPLANT    . SPINAL CORD STIMULATOR REMOVAL    . TONSILLECTOMY      Her Family History Is Significant For: No family history on file.  Her Social History Is Significant For: Social History   Socioeconomic History  . Marital status: Widowed    Spouse name: Not on file  . Number of children: Not on file  . Years of education: Not on file  . Highest education level: Not on file  Occupational History  . Not on file  Tobacco Use  . Smoking status: Former Smoker    Quit date: 11/20/2015    Years since quitting: 3.6  . Smokeless tobacco: Never Used  Substance and Sexual Activity  . Alcohol use: No  . Drug use: No  . Sexual activity: Not on file  Other Topics Concern  . Not on file  Social History Narrative  . Not on file   Social Determinants of Health   Financial Resource Strain:   . Difficulty of Paying Living Expenses:   Food Insecurity:   . Worried About Community education officer in the Last Year:   . Barista in the Last Year:   Transportation Needs:   . Freight forwarder (Medical):   Marland Kitchen Lack of Transportation (Non-Medical):   Physical Activity:   . Days of Exercise per Week:   . Minutes of Exercise per Session:   Stress:   . Feeling of Stress :   Social Connections:   . Frequency of Communication with Friends and Family:   . Frequency of Social Gatherings with Friends and Family:   . Attends Religious Services:   . Active Member of Clubs or Organizations:   . Attends Banker Meetings:   Marland Kitchen Marital Status:     Her Allergies Are:  Allergies  Allergen Reactions  . Demerol [Meperidine] Hives  . Dilaudid [Hydromorphone] Hives  . Morphine And Related Hives  . Neosporin [Neomycin-Bacitracin Zn-Polymyx] Other (See Comments)    Blisters skin  . Miconazole Nitrate Rash  . Vicodin [Hydrocodone-Acetaminophen] Itching  . Vistaril [Hydroxyzine] Itching  :   Her Current Medications Are:  Outpatient Encounter Medications as of 07/02/2019  Medication Sig  . cetirizine (ZYRTEC) 10 MG tablet Take 10 mg by mouth daily.  . ergocalciferol (VITAMIN D2) 1.25 MG (50000 UT) capsule Take by mouth.  . Estradiol Acetate (FEMRING) 0.1 MG/24HR RING Place vaginally.  Marland Kitchen ketoconazole (NIZORAL) 2 % cream Apply thin film once daily for two weeks  . loratadine (CLARITIN) 10 MG tablet Take 10 mg by mouth daily.  . magnesium oxide (MAG-OX) 400 MG tablet Take 400 mg by mouth daily.  Marland Kitchen oxyCODONE-acetaminophen (PERCOCET/ROXICET) 5-325 MG tablet Take 1 tablet by mouth every 4 (four) hours as needed for severe pain.  Marland Kitchen tiZANidine (ZANAFLEX) 2 MG tablet Take 4 mg by mouth 3 (three) times daily as needed.   . [DISCONTINUED] cyclobenzaprine (FLEXERIL) 10 MG tablet Take 10 mg by mouth 3 (three) times daily as needed for muscle spasms.  . [DISCONTINUED] diazepam (VALIUM) 5 MG tablet Take 5 mg by mouth every 8 (eight) hours as needed for anxiety.   .  [DISCONTINUED] metoprolol succinate (TOPROL-XL) 25 MG 24 hr tablet Take 25 mg by mouth daily.  . [DISCONTINUED] pregabalin (LYRICA) 50 MG capsule Take 50 mg by mouth every 36 (thirty-six) hours.   No facility-administered encounter medications on file as of 07/02/2019.  :   Review of Systems:  Out of a complete 14 point review of systems, all are reviewed and negative with the exception of these symptoms as listed below: Review of Systems  Neurological:       Pt presents today to discuss her right leg tremor. Her right leg is persistently jumpy even though she has had back surgery and PT. Pt is left handed.    Objective:  Neurological Exam  Physical Exam Physical Examination:   Vitals:   07/02/19 1137  BP: 126/75  Pulse:  83   General Examination: The patient is a very pleasant 54 y.o. female in no acute distress. She appears very thin and underweight.   HEENT: Normocephalic, atraumatic, pupils are equal, round and reactive to light and accommodation. Corrective glasses in place. Extraocular tracking is good without limitation to gaze excursion or nystagmus noted. Normal smooth pursuit is noted. Hearing is grossly intact. Face is symmetric with normal facial animation and normal facial sensation. Speech is edentulous. No dysarthria noted. There is no hypophonia. There is no lip, neck/head, jaw or voice tremor. Neck is supple with full range of passive and active motion. There are no carotid bruits on auscultation. Oropharynx exam reveals: moderate mouth dryness, edentulous, tongue protrudes centrally and palate elevates symmetrically.   Chest: Clear to auscultation without wheezing, rhonchi or crackles noted.  Heart: S1+S2+0, regular and normal without murmurs, rubs or gallops noted.   Abdomen: Soft, non-tender and non-distended with normal bowel sounds appreciated on auscultation.  Extremities: There is no pitting edema in the distal lower extremities bilaterally. Pedal pulses are  intact.  Skin: Warm and dry without trophic changes noted.  Musculoskeletal: exam reveals no obvious joint deformities, tenderness or joint swelling or erythema.   Neurologically:  Mental status: The patient is awake, alert and oriented in all 4 spheres. Her immediate and remote memory, attention, language skills and fund of knowledge are appropriate. There is no evidence of aphasia, agnosia, apraxia or anomia. Speech is clear with normal prosody and enunciation. Thought process is linear. Mood is normal and affect is normal.  Cranial nerves II - XII are as described above under HEENT exam. In addition: shoulder shrug is normal with equal shoulder height noted. Motor exam: thin bulk, normal strength and tone is noted.  She has an intermittent right foot tremor.  It is only apparent when she is sitting with her knees bent and dissipates with activation of the leg, with foot taps, with heel-to-shin testing, and also reduces or goes away with contralateral foot activation or testing of foot agility on the left side.  It does not have the quality of a resting tremor such as can be seen with Parkinson's disease and that the amplitude is variable and the frequency is rather fast.  She has no other tremors, she has no ankle clonus.  She has no other involuntary movements such as choreiform or athetoid movements or myoclonus. There is no drift, or rebound. Romberg is negative. Reflexes are 2+ in the UEs and 1+ in the LEs, Babinski: Toes are flexor bilaterally. Fine motor skills and coordination: intact with normal finger taps, normal hand movements, normal rapid alternating patting, normal foot taps and normal foot agility.  Cerebellar testing: No dysmetria or intention tremor on finger to nose testing. Heel to shin is unremarkable bilaterally. There is no truncal or gait ataxia.  Sensory exam: intact to light touch in the upper and lower extremities.  Gait, station and balance: She stands with mild difficulty  and pushes herself up.  She can stand narrow based.  She has initial trembling in the right foot while standing and has none with walking, preserved arm swing is noted, no shuffling, no gait ataxia.  She has difficulty with tandem walk.   Assessment and Plan:   In summary, Jerie Basford is a very pleasant 54 y.o.-year old female with an underlying medical history of chronic back pain, hip pain, degenerative disc disease, hypertension, headache, mitral valve prolapse, and arthritis, who presents for evaluation of her right foot tremor  approximately 18 months duration off and on.  She reports that some 2 months after her last back surgery she has had a more consistent tremor in the right foot which goes away with walking, with lying down, and stretching the leg.  She has had spinal cord stimulator placement and removal, I am not sure what type of back surgery she had, no specific lumbar spine surgery as mentioned in her records or any other note.  She has had hip injection for pain relief.  She is on narcotic pain medication.  Nevertheless, her presentation is not suggestive of any specific neurological underlying cause such as Parkinson's disease, essential tremor, myoclonus secondary to medication, restless leg syndrome, no evidence of choreoathetoid movements.  She is advised that I am not sure how to explain her presentation.  Examination shows an intermittent right foot tremor which is to some degree alleviated by contralateral activation, ipsilateral fine motor testing such as heel-to-shin and foot taps, it is somewhat distractible and unusual in its presentation.  A stroke or lesion in the brain may not fully explain her presentation either but I would be happy to pursue a brain MRI with and without contrast to see if there is any structural cause or lesion or stroke that may be causing these unusual tremors.  I did not recommend any new medication from my end of things and advised her that we would be  calling with her MRI results.  She is advised to follow-up with her primary care provider on a regular basis.I can see her back on an as-needed basis.  I answered all her questions today and she was in agreement.  She reports being very claustrophobic and that she would need something for her MRI such as Valium.  I explained to her that we would have to be very cautious with a benzodiazepine type medication as she is on chronic narcotic pain medicine and an addition of a benzodiazepine could be dangerous.  I would be willing to prescribe a small dose of alprazolam which is short acting for the purpose of doing the MRI when we are ready to schedule her.  She was in agreement. Thank you very much for allowing me to participate in the care of this nice patient. If I can be of any further assistance to you please do not hesitate to call me at [phone].  Sincerely,   Huston Foley, MD, PhD

## 2019-07-02 NOTE — Telephone Encounter (Signed)
[employer] medicare/medicaid order sent to [facility]. No auth they will reach out to the patient to schedule.  °

## 2019-07-03 LAB — COMPREHENSIVE METABOLIC PANEL
ALT: 11 IU/L (ref 0–32)
AST: 16 IU/L (ref 0–40)
Albumin/Globulin Ratio: 2.5 — ABNORMAL HIGH (ref 1.2–2.2)
Albumin: 4.5 g/dL (ref 3.8–4.9)
Alkaline Phosphatase: 73 IU/L (ref 39–117)
BUN/Creatinine Ratio: 13 (ref 9–23)
BUN: 8 mg/dL (ref 6–24)
Bilirubin Total: 0.6 mg/dL (ref 0.0–1.2)
CO2: 25 mmol/L (ref 20–29)
Calcium: 9.5 mg/dL (ref 8.7–10.2)
Chloride: 100 mmol/L (ref 96–106)
Creatinine, Ser: 0.61 mg/dL (ref 0.57–1.00)
GFR calc Af Amer: 118 mL/min/{1.73_m2} (ref >59)
GFR calc non Af Amer: 102 mL/min/{1.73_m2} (ref >59)
Globulin, Total: 1.8 g/dL (ref 1.5–4.5)
Glucose: 89 mg/dL (ref 65–99)
Potassium: 3.7 mmol/L (ref 3.5–5.2)
Sodium: 139 mmol/L (ref 134–144)
Total Protein: 6.3 g/dL (ref 6.0–8.5)

## 2019-07-07 ENCOUNTER — Encounter: Payer: 59 | Admitting: Physical Therapy

## 2019-07-07 ENCOUNTER — Ambulatory Visit: Payer: 59 | Attending: Physical Medicine and Rehabilitation | Admitting: Physical Therapy

## 2019-07-07 ENCOUNTER — Encounter: Payer: Self-pay | Admitting: Physical Therapy

## 2019-07-07 ENCOUNTER — Other Ambulatory Visit: Payer: Self-pay

## 2019-07-07 DIAGNOSIS — M6281 Muscle weakness (generalized): Secondary | ICD-10-CM | POA: Diagnosis present

## 2019-07-07 DIAGNOSIS — R262 Difficulty in walking, not elsewhere classified: Secondary | ICD-10-CM | POA: Insufficient documentation

## 2019-07-07 DIAGNOSIS — M25551 Pain in right hip: Secondary | ICD-10-CM | POA: Diagnosis present

## 2019-07-07 NOTE — Patient Instructions (Signed)
Resisted hip flexion in supine with green t band in hooklying 2 x 10 reps as shown in clinic   Step Down: Anterior   From 4-6" step, reach one leg forward as far as possible, still able to return easily. Touch toe and heel to floor. Return to single leg balance. Repeat with other leg. Repeat __4 x 5 RT and LT__ times. Do __1-2__ sessions per day.   Copyright  VHI. All rights reserved.  Step-Up: Lateral   Step up to side with right leg. Bring other foot up onto __6__ inch step. Return to floor position with left leg. Repeat __2 10__ times per session. Do 1-2____ sessions per day. Repeat in dimly lit room. Repeat with eyes closed.  Copyright  VHI. All rights reserved.  Forward   Facing step, place one leg on step, flexed at hip. Step up slowly, bringing hips in line with knee and shoulder. Bring other foot onto step. Reverse process to step back down. Remember to tip toe on foot on ground to truly strengthen leg on step Repeat with other leg. Do _2 x 10___ repetitions, _1 -2 times a day  http://[email or web address].exer.[email or web address]   Garen Lah, PT Certified Exercise Expert for the Aging Adult  07/07/19 12:13 PM Phone: [phone] Fax: [phone]

## 2019-07-07 NOTE — Therapy (Signed)
[facility] [address] [place], [place], [address] Phone: [phone]   Fax:  [phone]  Physical Therapy Treatment  Patient Details  Name: Christine Roberson MRN: [id number] Date of Birth: [birth year] Referring Provider (PT): Romero Belling MD   Encounter Date: 07/07/2019  PT End of Session - 07/07/19 1142    Visit Number  8    Number of Visits  13    Date for PT Re-Evaluation  07/16/19    Authorization Type  [employer] medicare / MCD 2ndary    PT Start Time  1144    PT Stop Time  1245    PT Time Calculation (min)  61 min    Activity Tolerance  Patient tolerated treatment well    Behavior During Therapy  [facility] for tasks assessed/performed       Past Medical History:  Diagnosis Date  . Arthritis   . Chronic back pain   . DDD (degenerative disc disease), lumbar   . Headache   . Hypertension   . MVP (mitral valve prolapse)     Past Surgical History:  Procedure Laterality Date  . ABDOMINAL HYSTERECTOMY    . CLOSED MANIPULATION SHOULDER WITH STERIOD INJECTION Right 03/05/2017   Procedure: CLOSED MANIPULATION SHOULDER WITH STEROID INJECTION;  Surgeon: Sheral Apley, MD;  Location: [facility];  Service: Orthopedics;  Laterality: Right;  . JOINT REPLACEMENT Right    TKR  . KNEE ARTHROSCOPY Bilateral   . SPINAL CORD STIMULATOR IMPLANT    . SPINAL CORD STIMULATOR REMOVAL    . TONSILLECTOMY      There were no vitals filed for this visit.  Subjective Assessment - 07/07/19 1147    Subjective  I broke my RT Big toe because I was going to the bathroom and hit my RT great toe, had bruising.   I had a bad night last night.  cramps in my legs and feet. I am still having a lot of involuntary movement in my RT leg I will have a brain scan on April 28th with and without contrast. I will see the nutritionist end of month.    Pertinent History  chronic back pain (2 surgerie of back in last year , DDD, RT TKR, bone spur off RT  shld with manipulation of shld, LT knee 2 arthroscopic OA, Osteoporosis  worse in LT than RT    Limitations  Sitting;Standing;Walking    How long can you sit comfortably?  Must sit with RT leg extended for about 10 to 15 min before shifting    How long can you stand comfortably?  Can stand for 10 mins, but varies from day to day, involuntary movement is an issue    How long can you walk comfortably?  < 15 nmintes and then My RT leg cannot do any more    Diagnostic tests  none recently    Patient Stated Goals  To alleviate pain, be able stand and do chores, drive without pain.  Stand and wash dishes.    Currently in Pain?  Yes    Pain Score  4     Pain Location  Hip    Pain Orientation  Right    Pain Descriptors / Indicators  Aching    Pain Type  Chronic pain    Pain Onset  More than a month ago    Pain Frequency  Constant         [facility] PT Assessment - 07/07/19 0001  Assessment   Medical Diagnosis  RT hip bursitis    Referring Provider (PT)  Romero Belling MD    Onset Date/Surgical Date  05/18/19    Hand Dominance  Left      Observation/Other Assessments   Focus on Therapeutic Outcomes (FOTO)   intake 67% limitation 53% predicted 42%      Strength   Overall Strength  Deficits    Right Hip Flexion  4/5    Right Hip Extension  4-/5    Right Hip ABduction  4-/5    Left Hip Flexion  4/5    Left Hip Extension  4-/5    Left Hip ABduction  4/5    Right Knee Flexion  4/5    Right Knee Extension  4/5    Left Knee Flexion  4/5    Left Knee Extension  4/5    Right Ankle Plantar Flexion  4/5    Left Ankle Plantar Flexion  4/5      Palpation   Palpation comment  tenderness post greater trochanter and RT gluteals/ piriformis.  Pt with atrophied muscles of RT gluteals. PT with involuntary movements for RT leg and tremors                   [facility] Adult PT Treatment/Exercise - 07/07/19 0001      Knee/Hip Exercises: Stretches   Passive Hamstring Stretch  Both;2  reps;10 seconds    Piriformis Stretch Limitations  hooklying pull across   3 x 30 sec RT and LT   Other Knee/Hip Stretches  supine double knee to chest hold 10 sec x 2      Knee/Hip Exercises: Supine   Single Leg Bridge  2 sets;Both;10 reps   ;10 reps  with ball squeeze for ease of pt   Straight Leg Raises  Strengthening;Right;Left;2 sets;10 reps    Other Supine Knee/Hip Exercises  Double knee flexion, resisted flexion with green t band in supine and hookllyin 20 x RT and then LT    Other Supine Knee/Hip Exercises  Core Progression Marches      Knee/Hip Exercises: Sidelying   Hip ABduction  Both;2 sets;10 reps      Moist Heat Therapy   Number Minutes Moist Heat  10 Minutes    Moist Heat Location  Hip;Other (comment)   Glutes     Manual Therapy   Manual Therapy  Joint mobilization;Soft tissue mobilization    Manual therapy comments  skilled palpation with TPDN    Joint Mobilization  LAD Grade I and II on Rt. Side, AP Grade I at hip    Soft tissue mobilization  gluteals and piriformis on RT STW        Trigger Point Dry Needling - 07/07/19 0001    Consent Given?  Yes    Education Handout Provided  Previously provided    Muscles Treated Back/Hip  Gluteus medius;Piriformis;Gluteus maximus   RT only   Other Dry Needling  75 mm .30 gage    Gluteus Medius Response  Twitch response elicited;Palpable increased muscle length    Gluteus Maximus Response  Twitch response elicited;Palpable increased muscle length    Piriformis Response  Twitch response elicited;Palpable increased muscle length           PT Education - 07/07/19 1214    Education Details  added step ups and lateral and step downs with tip toe to increase strength of leg on step and resisted supine hip flexion    Person(s) Educated  Patient    Methods  Explanation;Demonstration;Tactile cues;Verbal cues;Handout    Comprehension  Verbalized understanding;Returned demonstration       PT Short Term Goals - 06/04/19  1722      PT SHORT TERM GOAL #1   Title  STG=LTG        PT Long Term Goals - 07/07/19 1217      PT LONG TERM GOAL #1   Title  Pt will be independent with advance HEP    Baseline  Working on advanced Research scientist (physical sciences) exericises, pt being checked by neurology    Time  6    Period  Weeks    Status  On-going      PT LONG TERM GOAL #2   Title  Pt will be able to lift 25 # of groceries from floor to counter    Baseline  Pt fatigues carrying 10 lb DB continuing    Time  6    Period  Weeks    Status  On-going      PT LONG TERM GOAL #3   Title  Pt will be able to stand for 30 minutes to complete household chores without exacerbating hip pain    Baseline  4/10 and can only stand 15 minutes without fatigue and tremors in leg    Time  6    Period  Weeks    Status  On-going      PT LONG TERM GOAL #4   Title  FOTO to 42% limited to indicate significant improvement in functional ability    Baseline  67% limtation on eval 07-07-19 limited 53%    Time  6    Period  Weeks    Status  On-going      PT LONG TERM GOAL #5   Title  Pt will be able to drive with pain 4/31 or less in order to drive to the store more comfortably    Baseline  Pt with 4/10 pain and drives but uncontrollabel tremors are major limiting factore    Time  6    Period  Weeks    Status  On-going            Plan - 07/07/19 1239    Clinical Impression Statement  Pt enters clinic with 4/10 pain and continuing uncontrollalble tremors. she recently broke her RT Great toe with visible bruising by running into bathroom door.  She is scheduled for a brain CT to determine possible contributing factore for uncontrollabel tremors including CVA. Will continue to be monitorred by MD.  Mardelle Matte is 4/10 pain but she is clearly more limited by the uncontrollabel tremors.  she still is emaciated and has atrophy of muscles.  she will be seeing nutritionist at end of the month as well. Will continue to strenthen.  FOTO  improved from 67% to 53% liimitation but clearly does not address the neurological anomalies.  Will continue POC    Personal Factors and Comorbidities  Comorbidity 1;Comorbidity 2;Past/Current Experience    Comorbidities  chronic back pain (2 surgerie of back in last year , DDD, RT TKR, bone spur off RT shld with manipulation of shld, LT knee 2 arthroscopic OA, Osteoporosis  worse in LT than RT    Examination-Activity Limitations  Stairs;Stand;Sit;Sleep;Dressing;Transfers    Examination-Participation Restrictions  Cleaning    Stability/Clinical Decision Making  Evolving/Moderate complexity    Clinical Decision Making  Moderate    Rehab Potential  Good    PT Frequency  2x /  week    PT Treatment/Interventions  Electrical Stimulation;Cryotherapy;Iontophoresis 4mg /ml Dexamethasone;Moist Heat;Ultrasound;Therapeutic exercise;Therapeutic activities;Neuromuscular re-education;Patient/family education;Passive range of motion;Manual techniques;Dry needling;Taping;Joint Manipulations;Spinal Manipulations    PT Next Visit Plan  Continue hip bursities. TPDN and strengthening, Follow-up on hip flexion exercises and stretches, Reformer is preferred by pt    PT Home Exercise Plan  bridging and clams sidelying  [id number], supine abdominal bracing, supine double knee to chest, supine hamstring stretch, supine piriformis and hip flexor stretches step ups and resisted flexion with green t band    Consulted and Agree with Plan of Care  Patient       Patient will benefit from skilled therapeutic intervention in order to improve the following deficits and impairments:  Decreased activity tolerance, Decreased mobility, Decreased range of motion, Decreased strength, Difficulty walking, Increased muscle spasms, Pain, Obesity  Visit Diagnosis: Pain in right hip  Muscle weakness (generalized)  Difficulty in walking, not elsewhere classified     Problem List There are no problems to display for this  patient.  Voncille Lo, PT Certified Exercise Expert for the Aging Adult  07/07/19 12:51 PM Phone: [phone] Fax: [facility] [address] [place], [place], [address] Phone: [phone]   Fax:  [phone]  Name: Christine Roberson MRN: [id number] Date of Birth: 15-Nov-1965

## 2019-07-09 ENCOUNTER — Ambulatory Visit: Payer: 59 | Admitting: Physical Therapy

## 2019-07-13 ENCOUNTER — Encounter: Payer: Self-pay | Admitting: Physical Therapy

## 2019-07-13 ENCOUNTER — Other Ambulatory Visit: Payer: Self-pay

## 2019-07-13 ENCOUNTER — Ambulatory Visit: Payer: 59 | Admitting: Physical Therapy

## 2019-07-13 DIAGNOSIS — R262 Difficulty in walking, not elsewhere classified: Secondary | ICD-10-CM

## 2019-07-13 DIAGNOSIS — M6281 Muscle weakness (generalized): Secondary | ICD-10-CM

## 2019-07-13 DIAGNOSIS — M25551 Pain in right hip: Secondary | ICD-10-CM

## 2019-07-13 NOTE — Therapy (Signed)
Christine Roberson, [place], [address] Phone: [phone]   Fax:  [phone]  Physical Therapy Treatment  Patient Details  Name: Christine Roberson MRN: [id number] Date of Birth: Feb 26, 1966 Referring Provider (PT): Laroy Apple MD   Encounter Date: 07/13/2019  PT End of Session - 07/13/19 1120    Visit Number  9    Number of Visits  13    Date for PT Re-Evaluation  07/16/19    Authorization Type  [employer] medicare / MCD 2ndary    PT Start Time  1122    PT Stop Time  1159    PT Time Calculation (min)  37 min    Activity Tolerance  Patient tolerated treatment well    Behavior During Therapy  [facility] for tasks assessed/performed       Past Medical History:  Diagnosis Date  . Arthritis   . Chronic back pain   . DDD (degenerative disc disease), lumbar   . Headache   . Hypertension   . MVP (mitral valve prolapse)     Past Surgical History:  Procedure Laterality Date  . ABDOMINAL HYSTERECTOMY    . CLOSED MANIPULATION SHOULDER WITH STERIOD INJECTION Right 03/05/2017   Procedure: CLOSED MANIPULATION SHOULDER WITH STEROID INJECTION;  Surgeon: Christine Butters, MD;  Location: [facility];  Service: Orthopedics;  Laterality: Right;  . JOINT REPLACEMENT Right    TKR  . KNEE ARTHROSCOPY Bilateral   . SPINAL CORD STIMULATOR IMPLANT    . SPINAL CORD STIMULATOR REMOVAL    . TONSILLECTOMY      There were no vitals filed for this visit.  Subjective Assessment - 07/13/19 1122    Subjective  " I had one good day in the last three. Yesterday the shaking and pain came back. I have to go to the neurologist to get a brain scan to check for a stroke. I see the nutritionist on the 29th. Tremor and pain decreased two days after last PT session, but it is back now."    Pertinent History  chronic back pain (2 surgerie of back in last year , DDD, RT TKR, bone spur off RT shld with manipulation of shld, LT knee 2 arthroscopic  OA, Osteoporosis  worse in LT than RT    Limitations  Sitting;Standing;Walking    How long can you sit comfortably?  Must sit with RT leg extended for about 10 to 15 min before shifting    How long can you stand comfortably?  Can stand for 10 mins, but varies from day to day, involuntary movement is an issue    How long can you walk comfortably?  < 15 nmintes and then My RT leg cannot do any more    Diagnostic tests  none recently    Patient Stated Goals  To alleviate pain, be able stand and do chores, drive without pain.  Stand and wash dishes.    Currently in Pain?  Yes    Pain Score  5     Pain Location  Hip    Pain Orientation  Right    Pain Descriptors / Indicators  Aching    Pain Type  Chronic pain    Pain Radiating Towards  To the knee    Pain Onset  More than a month ago    Pain Frequency  Constant    Pain Relieving Factors  Ice, heat, topical pain relief    Multiple Pain Sites  No  [facility] Adult PT Treatment/Exercise - 07/13/19 0001      Pilates   Pilates Reformer  Supine overhead flexion and extension    Pt. was able to complete 3 reps, but was limited by tremors     Knee/Hip Exercises: Supine   Straight Leg Raises  Strengthening;Both;2 sets;10 reps      Manual Therapy   Manual Therapy  Other (comment)    Other Manual Therapy  Trigger point technique and massage performed over Rt. hamstrings    Pt. provided tennis ball            PT Education - 07/13/19 1217    Education Details  Patient educated on trigger point techniques and self massage with tennis ball. She was also educated on hamstring activation in relation to her tremors    Person(s) Educated  Patient    Methods  Explanation;Demonstration    Comprehension  Verbalized understanding       PT Short Term Goals - 06/04/19 1722      PT SHORT TERM GOAL #1   Title  STG=LTG        PT Long Term Goals - 07/07/19 1217      PT LONG TERM GOAL #1   Title  Pt will be  independent with advance HEP    Baseline  Working on advanced Equities trader exericises, pt being checked by neurology    Time  6    Period  Weeks    Status  On-going      PT LONG TERM GOAL #2   Title  Pt will be able to lift 25 # of groceries from floor to counter    Baseline  Pt fatigues carrying 10 lb DB continuing    Time  6    Period  Weeks    Status  On-going      PT LONG TERM GOAL #3   Title  Pt will be able to stand for 30 minutes to complete household chores without exacerbating hip pain    Baseline  4/10 and can only stand 15 minutes without fatigue and tremors in leg    Time  6    Period  Weeks    Status  On-going      PT LONG TERM GOAL #4   Title  FOTO to 42% limited to indicate significant improvement in functional ability    Baseline  67% limtation on eval 07-07-19 limited 53%    Time  6    Period  Weeks    Status  On-going      PT LONG TERM GOAL #5   Title  Pt will be able to drive with pain 7/03 or less in order to drive to the store more comfortably    Baseline  Pt with 4/10 pain and drives but uncontrollabel tremors are major limiting factore    Time  6    Period  Weeks    Status  On-going            Plan - 07/13/19 1201    Clinical Impression Statement  Patient presents to the clinic with increased tremors. Patient demonstrated an increase in tremors during knee flexion and MET on the hamstrings. She was unable to control the shaking for a brief period of time, but was able to get the muscle to calm down after the hamstrings were massaged. We talked to her about resolving weight loss issues with nutritionist before scheduling additional appointments beyond 07/13/2019.    Personal Factors  and Comorbidities  Comorbidity 1;Comorbidity 2;Past/Current Experience    Comorbidities  chronic back pain (2 surgerie of back in last year , DDD, RT TKR, bone spur off RT shld with manipulation of shld, LT knee 2 arthroscopic OA, Osteoporosis  worse in LT than RT     Examination-Activity Limitations  Stairs;Stand;Sit;Sleep;Dressing;Transfers    Examination-Participation Restrictions  Cleaning    Stability/Clinical Decision Making  Evolving/Moderate complexity    Clinical Decision Making  Moderate    Rehab Potential  Good    PT Frequency  2x / week    PT Duration  6 weeks    PT Treatment/Interventions  Electrical Stimulation;Cryotherapy;Iontophoresis 50m/ml Dexamethasone;Moist Heat;Ultrasound;Therapeutic exercise;Therapeutic activities;Neuromuscular re-education;Patient/family education;Passive range of motion;Manual techniques;Dry needling;Taping;Joint Manipulations;Spinal Manipulations    PT Next Visit Plan  Continue hip bursities. TPDN and strengthening, Follow-up on hip flexion exercises and stretches, Reformer is preferred by pt, Hamstring bias    PT Home Exercise Plan  bridging and clams sidelying  FVCDTRCCURL, supine abdominal bracing, supine double knee to chest, supine hamstring stretch, supine piriformis and hip flexor stretches step ups and resisted flexion with green t band    Consulted and Agree with Plan of Care  Patient       Patient will benefit from skilled therapeutic intervention in order to improve the following deficits and impairments:  Decreased activity tolerance, Decreased mobility, Decreased range of motion, Decreased strength, Difficulty walking, Increased muscle spasms, Pain, Obesity  Visit Diagnosis: Pain in right hip  Muscle weakness (generalized)  Difficulty in walking, not elsewhere classified     Problem List There are no problems to display for this patient.   MLaveda Norman SPT Lylah Lantis C. Olyver Hawes PT, DPT 07/13/19 2:23 PM  During this treatment session, the therapist was present, participating in and directing the treatment.  [place] [address]Phone: [phone]  Fax:  [phone] Name: DAribelle MccoshMRN:  [id number] of Birth: 412-17-1967

## 2019-07-15 ENCOUNTER — Ambulatory Visit: Payer: 59 | Admitting: Physical Therapy

## 2019-07-15 ENCOUNTER — Encounter: Payer: Self-pay | Admitting: Physical Therapy

## 2019-07-15 ENCOUNTER — Other Ambulatory Visit: Payer: Self-pay

## 2019-07-15 DIAGNOSIS — M25551 Pain in right hip: Secondary | ICD-10-CM | POA: Diagnosis not present

## 2019-07-15 DIAGNOSIS — R262 Difficulty in walking, not elsewhere classified: Secondary | ICD-10-CM

## 2019-07-15 DIAGNOSIS — M6281 Muscle weakness (generalized): Secondary | ICD-10-CM

## 2019-07-15 NOTE — Therapy (Signed)
[place], [place], [address] Phone: [phone]   Fax:  [phone]  Physical Therapy Treatment/Discharge   Patient Details  Name: Christine Roberson MRN: [id number] Date of Birth: [birth year] Referring Provider (PT): Laroy Apple MD   Encounter Date: 07/15/2019  PT End of Session - 07/15/19 1248    Visit Number  10    Number of Visits  13    Date for PT Re-Evaluation  07/16/19    Authorization Type  [employer] medicare / MCD 2ndary    PT Start Time  1145    PT Stop Time  1229    PT Time Calculation (min)  44 min    Activity Tolerance  Patient tolerated treatment well    Behavior During Therapy  [facility] for tasks assessed/performed       Past Medical History:  Diagnosis Date  . Arthritis   . Chronic back pain   . DDD (degenerative disc disease), lumbar   . Headache   . Hypertension   . MVP (mitral valve prolapse)     Past Surgical History:  Procedure Laterality Date  . ABDOMINAL HYSTERECTOMY    . CLOSED MANIPULATION SHOULDER WITH STERIOD INJECTION Right 03/05/2017   Procedure: CLOSED MANIPULATION SHOULDER WITH STEROID INJECTION;  Surgeon: Renette Butters, MD;  Location: [facility];  Service: Orthopedics;  Laterality: Right;  . JOINT REPLACEMENT Right    TKR  . KNEE ARTHROSCOPY Bilateral   . SPINAL CORD STIMULATOR IMPLANT    . SPINAL CORD STIMULATOR REMOVAL    . TONSILLECTOMY      There were no vitals filed for this visit.  Subjective Assessment - 07/15/19 1204    Subjective  Patient reports that she did well after the last PT session, but was unable to get out of bed the next morning due to cramping. She states that the tennis ball has been helping her massage her hamstrings.    Pertinent History  chronic back pain (2 surgerie of back in last year , DDD, RT TKR, bone spur off RT shld with manipulation of shld, LT knee 2 arthroscopic OA, Osteoporosis  worse in LT than RT    Limitations   Sitting;Standing;Walking    How long can you sit comfortably?  Must sit with RT leg extended for about 10 to 15 min before shifting    How long can you stand comfortably?  Can stand for 10 mins, but varies from day to day, involuntary movement is an issue    How long can you walk comfortably?  < 15 nmintes and then My RT leg cannot do any more    Patient Stated Goals  To alleviate pain, be able stand and do chores, drive without pain.  Stand and wash dishes.    Currently in Pain?  Yes    Pain Score  4     Pain Location  Hip    Pain Orientation  Right    Pain Type  Chronic pain    Pain Radiating Towards  To the back of thigh    Pain Onset  More than a month ago    Pain Frequency  Constant    Aggravating Factors   Pain is worse with fatigued and believes that her pain is worse with adverse weather.    Pain Relieving Factors  Ice, heat, TENS, topical pain relief    Multiple Pain Sites  No         [facility] PT Assessment -  07/15/19 0001      AROM   Right Hip Extension  15    Right Hip Flexion  125    Right Hip External Rotation   45    Right Hip Internal Rotation   52    Right Hip ABduction  42    Left Hip Extension  30    Left Hip Flexion  125    Left Hip External Rotation   65    Left Hip Internal Rotation   38    Left Hip ABduction  30      Strength   Right Hip Flexion  4/5    Right Hip Extension  4/5    Right Hip ABduction  4+/5    Left Hip Flexion  4/5    Left Hip Extension  4/5    Left Hip ABduction  4/5    Right Knee Flexion  4+/5    Right Knee Extension  5/5    Left Knee Flexion  4+/5    Left Knee Extension  5/5    Right Ankle Plantar Flexion  5/5    Left Ankle Plantar Flexion  5/5      Flexibility   Hamstrings  R 82, R 80                   [facility] Adult PT Treatment/Exercise - 07/15/19 0001      Knee/Hip Exercises: Stretches   Active Hamstring Stretch  Both;1 rep;60 seconds   Seated      Knee/Hip Exercises: Standing   Other Standing Knee Exercises   Standing w/ Rt. foot in front, rocking forward and back       Knee/Hip Exercises: Supine   Quad Sets  AROM;Both;10 reps;2 sets    Short Arc Quad Sets  AROM;Strengthening;Both;1 set    Straight Leg Raises  AROM;Both;1 set             PT Education - 07/15/19 1246    Education Details  Patient was educated on new HEP, quad contractions, and the function of the hamstrings. We also discussed the importance of seeing nutritionist before continuing with PT    Person(s) Educated  Patient    Methods  Explanation;Demonstration;Handout    Comprehension  Verbalized understanding;Returned demonstration       PT Short Term Goals - 06/04/19 1722      PT SHORT TERM GOAL #1   Title  STG=LTG        PT Long Term Goals - 07/07/19 1217      PT LONG TERM GOAL #1   Title  Pt will be independent with advance HEP    Baseline  Working on advanced Equities trader exericises, pt being checked by neurology    Time  6    Period  Weeks    Status  On-going      PT LONG TERM GOAL #2   Title  Pt will be able to lift 25 # of groceries from floor to counter    Baseline  Pt fatigues carrying 10 lb DB continuing    Time  6    Period  Weeks    Status  On-going      PT LONG TERM GOAL #3   Title  Pt will be able to stand for 30 minutes to complete household chores without exacerbating hip pain    Baseline  4/10 and can only stand 15 minutes without fatigue and tremors in leg    Time  6  Period  Weeks    Status  On-going      PT LONG TERM GOAL #4   Title  FOTO to 42% limited to indicate significant improvement in functional ability    Baseline  67% limtation on eval 07-07-19 limited 53%    Time  6    Period  Weeks    Status  On-going      PT LONG TERM GOAL #5   Title  Pt will be able to drive with pain 0/97 or less in order to drive to the store more comfortably    Baseline  Pt with 4/10 pain and drives but uncontrollabel tremors are major limiting factore    Time  6    Period  Weeks     Status  On-going            Plan - 07/15/19 1239    Clinical Impression Statement  Today the patient showed an improvement in strength and ROM measurements of the hip and knee. She responded well to exercises that contracted her quads, and lengthened her hamstrings. She was given some additional exercises to help control her tremors, which involved quad activation. She still has some strength deficits that need to be addressed, especially in the hip. Additionally, the patient continues to have difficulty with standing for extended periods of time and lifting heavier objects. We discussed making today's visit her last until she was able to see the nutritionist and address weight loss concerns.    Personal Factors and Comorbidities  Comorbidity 1;Comorbidity 2;Past/Current Experience    Comorbidities  chronic back pain (2 surgerie of back in last year , DDD, RT TKR, bone spur off RT shld with manipulation of shld, LT knee 2 arthroscopic OA, Osteoporosis  worse in LT than RT    Examination-Activity Limitations  Stairs;Stand;Sit;Sleep;Dressing;Transfers    Examination-Participation Restrictions  Cleaning    Stability/Clinical Decision Making  Stable/Uncomplicated    Clinical Decision Making  Moderate    Rehab Potential  Good    PT Frequency  2x / week    PT Duration  6 weeks    PT Treatment/Interventions  Electrical Stimulation;Cryotherapy;Iontophoresis 32m/ml Dexamethasone;Moist Heat;Ultrasound;Therapeutic exercise;Therapeutic activities;Neuromuscular re-education;Patient/family education;Passive range of motion;Manual techniques;Dry needling;Taping;Joint Manipulations;Spinal Manipulations    PT Next Visit Plan  Reformer is preferred by pt, Hamstring bias    PT Home Exercise Plan  bridging and clams sidelying  FVCDTRCCURL, supine abdominal bracing, supine double knee to chest, supine hamstring stretch, supine piriformis and hip flexor stretches step ups and resisted flexion with green t band,  quad sets, short arc quads    Consulted and Agree with Plan of Care  Patient       Patient will benefit from skilled therapeutic intervention in order to improve the following deficits and impairments:  Decreased activity tolerance, Decreased mobility, Decreased range of motion, Decreased strength, Difficulty walking, Increased muscle spasms, Pain, Obesity  Visit Diagnosis: Pain in right hip  Muscle weakness (generalized)  Difficulty in walking, not elsewhere classified     Problem List There are no problems to display for this patient.   MLaveda Norman SPT 07/15/2019, 1:02 PM  [facility] [address]Phone: [phone]  Fax:  [phone] Name: Christine CoverdaleMRN: [id number] of Birth: 4Feb 08, 1967   PHYSICAL THERAPY DISCHARGE SUMMARY  Visits from Start of Care: 10  Current functional level related to goals / functional outcomes: FOTO - 67% limited    Remaining  deficits: Hip strength, standing for extended periods, lifting    Education / Equipment: Patient provided HEP Plan: Patient agrees to discharge.  Patient goals were partially met. Patient is being discharged due to lack of progress.  ?????

## 2019-07-23 ENCOUNTER — Telehealth: Payer: Self-pay | Admitting: Neurology

## 2019-07-23 MED ORDER — ALPRAZOLAM 0.5 MG PO TABS: ORAL_TABLET | ORAL | 0 refills | Status: AC

## 2019-07-23 NOTE — Telephone Encounter (Signed)
I have ordered Xanax for patient's upcoming MRI due to anxiety/claustrophobia reported. Please inform patient or caregiver and remind them, that she should not drive after taking Xanax and have someone take her for the MRI appointment.   

## 2019-07-23 NOTE — Telephone Encounter (Signed)
Pt notified Xanax would be sent and to have a driver to take her home after taking the xanax.  Pt verbalized understanding.

## 2019-07-23 NOTE — Telephone Encounter (Signed)
Patient called to verify if a prescription would be called in for her to take for her MRI apt as she is claustrophobic

## 2019-07-27 NOTE — Telephone Encounter (Signed)
Pls call pt back:  I don't believe, shingles is the cause of her tremor.  I would recommend we proceed with the MRI as scheduled.  I made those 2 corrections to my note from 07/02/19.

## 2019-07-27 NOTE — Telephone Encounter (Addendum)
Pt called stating she has shingles on the top of both feet, wanting to know if that was the cause of the shaking of her legs or if it was something else causing it, should she go through with getting her MRI with the new development. Please advise Also stating there were a few things incorrect in her chart such as the pt is left handed not right and stated she's never had high blood

## 2019-07-27 NOTE — Telephone Encounter (Signed)
I reached out to pt and advised of Dr. Teofilo Pod message. Pt verbalized understanding and had no questions/concerns at this time.

## 2019-07-28 ENCOUNTER — Other Ambulatory Visit: Payer: Self-pay

## 2019-07-28 ENCOUNTER — Encounter: Payer: Self-pay | Admitting: Registered"

## 2019-07-28 ENCOUNTER — Encounter: Payer: 59 | Attending: Neurological Surgery | Admitting: Registered"

## 2019-07-28 DIAGNOSIS — R627 Adult failure to thrive: Secondary | ICD-10-CM | POA: Diagnosis present

## 2019-07-28 DIAGNOSIS — Z681 Body mass index (BMI) 19 or less, adult: Secondary | ICD-10-CM | POA: Insufficient documentation

## 2019-07-28 DIAGNOSIS — Z713 Dietary counseling and surveillance: Secondary | ICD-10-CM | POA: Insufficient documentation

## 2019-07-28 NOTE — Patient Instructions (Signed)
-   Aim to have Ensure Enlive 3 times a day: 10 am, 4 pm, and 10 pm.   - Continue to have lunch and dinner daily as usual.

## 2019-07-28 NOTE — Progress Notes (Signed)
Appointment start time: 10:10  Appointment end time: 11:05  Patient was seen on 07/28/2019 for nutrition counseling pertaining to disordered eating  Primary care provider: Golden Pop, NP Therapist: none  ROI: N/A Any other medical team members: Emelda Brothers, MD  Past medical history: Afib, cardiomyopathy, mitral valve prolapse  Assessment  Pt arrives stating she attends physical therapy and has stopped due to nutritional concerns. States right leg will start shaking by itself and trying to figure out what is going on with it. States she was working to help build muscle during PT appointments.   States she eats all the time throughout the day. Reports her eating regimen/appetite changed in 2010 when husband was diagnosed with Stage IV cancer and passed away 1 month later. States she was weighing 135 lbs in 2010. Reports recent back and joint surgeries have decreased appetite as well. States she does not feel like eating at times but will eat chips or cookies just to eat something.   States she her average weight during adult life has been around 95-100 lbs. States even when pregnant she weighed about 120 lbs and lost it after delivery. Has 2 sons. Lives with sister and son.   Reports she wakes up around 8 am, does not feel like eating until 11-12 pm. Reports she loves vegetables more than she likes meat.   States heart flutters when doing laundry, changing sheets on bed, and while at the grocery store. States she will sometimes weigh at home when she's been eating well. Notices weight increase then states something will happen and it starts to decrease again.    Growth Metrics: Goal rate of weight gain:  0.5-1.0 lb/week  Eating history: Length of time: ~11 years  Previous treatments: none Goals for RD meetings: improve cold intolerance,  dizziness/lightheadedness  Weight history:  Today's weight: 84.6  Highest weight: 135   Lowest weight: 81 Most consistent weight:  95-100 What would you like to weigh: at least 120 lbs How has weight changed in the past year: up and down fluctuates between 81-95 lbs  Medical Information:  Changes in hair, skin, nails since ED started: bruises easily, shingles on feet Chewing/swallowing difficulties: no Reflux or heartburn: no Trouble with teeth: wears dentures Constipation, diarrhea: no, has BM 1-2x/day Dizziness/lightheadedness: when BP is low, happens a couple of times a week Headaches/body aches: yes, daily correlates it to chronic joint issues Heart racing/chest pain: has PMH Afib, cardiomyopathy, mitral valve prolapse; flutters daily Mood: aggravated with leg tremors Sleep: not good; sleeps 4-5 hrs Focus/concentration: good Cold intolerance: yes Vision changes: no  Mental health diagnosis:    Dietary assessment: A typical day consists of 1 meals and 3 snacks  Safe foods include: likes a variety of things  Avoided foods include: none   24 hour recall:  B (10 am): coconut cake + hot tea S: bag of popcorn L (12-1 pm): sometimes skips; sometimes Ramen noodles S: D (7pm): 4 oz baked pork chop (with bbq sauce) + baked potato (with butter and salt) + green peas + gummy candy S: orange  Beverages: hot tea (with sugar and whole milk; 12 oz), mountain dew (30 oz)  What Methods Do You Use To Control Your Weight (Compensatory behaviors)?  none  Estimated energy intake: ~1500-1600 kcal  Estimated energy needs: 1800-2200 kcal 225-275 g CHO 90-110 g pro 60-73 g fat  Nutrition Diagnosis: NB-1.1 Food and nutrition-related knowledge deficit As related to skipping meals.  As evidenced by dietary recall.  Intervention/Goals:  Pt was educated and counseled on eating to nourish the body, signs/symptoms of not being adequately nourished, and ways to increase nourishment. Discussed when to add in nutritional shakes to her daily routine. Discussed potentially feeling bloated, gastroparesis, abdominal distention,  and feelings of fullness when increasing intake. Pt was in agreement with goals listed. Goals: - Aim to have Ensure Enlive 3 times a day: 10 am, 4 pm, and 10 pm.  - Continue to have lunch and dinner daily as usual.   Monitoring and Evaluation: Patient will follow up in 3 weeks due to providers availability. Would prefer to have follow-up no later than 2 weeks.

## 2019-07-29 ENCOUNTER — Ambulatory Visit
Admission: RE | Admit: 2019-07-29 | Discharge: 2019-07-29 | Disposition: A | Discharge status: Home / Self Care | Payer: 59 | Source: Ambulatory Visit | Attending: Neurology | Admitting: Neurology

## 2019-07-29 DIAGNOSIS — R251 Tremor, unspecified: Secondary | ICD-10-CM

## 2019-07-29 MED ORDER — GADOBENATE DIMEGLUMINE 529 MG/ML IV SOLN
9.0000 mL | Freq: Once | INTRAVENOUS | Status: AC | PRN
  Administered 2019-07-29: 14:00:00 9 mL via INTRAVENOUS

## 2019-07-30 ENCOUNTER — Other Ambulatory Visit: Payer: Self-pay

## 2019-08-03 ENCOUNTER — Telehealth: Payer: Self-pay | Admitting: Neurology

## 2019-08-03 NOTE — Telephone Encounter (Signed)
Please call patient regarding the recent brain MRI from 07/29/19: The brain scan showed a normal structure of the brain and no significant volume loss which we call atrophy.   There were changes in the deeper structures of the brain, which we call white matter changes or microvascular changes. These were reported as moderate and advanced for age in Her case. These are tiny white spots, that occur with time and are seen in a variety of conditions, including with normal aging, chronic hypertension, chronic headaches, especially migraine HAs, chronic diabetes, chronic hyperlipidemia. These are not strokes and no mass or lesion or contrast enhancement was seen which is reassuring. Again, there were no acute findings, such as a stroke, or mass or blood products and no obvious cause for her tremor.  For vascular changes and chronic "hardening of the arteries" we recommend: good blood pressure control, good cholesterol control, good blood sugar control, and weight management.   I would like to suggest we recheck her MRI in about 6 months for comparison. Please offer her a FU appt in 6 mo and we can order MRI brain w/wo contrast then, to make sure findings are stable.   Thanks,   Huston Foley, MD, PhD

## 2019-08-04 NOTE — Telephone Encounter (Signed)
Please call pt: I really do not have any other recommendations for evaluation and further treatment.  I would recommend she have a follow-up appointment with her primary care provider and make sure her labs are okay, including thyroid function, vitamin B12, vitamin D.  I would also encourage her to talk to her primary care about a referral for second opinion to another neurologist, movement disorder specialist at an academic practice such as [facility] or [facility] or [facility].

## 2019-08-04 NOTE — Telephone Encounter (Signed)
I reached out to the pt and we verbalized understanding of results but wanted to know what her next steps would be in regards to treatment? She sts the tremors are still present and would like to know if any further testing or recommendations could be made?

## 2019-08-05 NOTE — Telephone Encounter (Signed)
I reached out to the pt and advised of Dr. Teofilo Pod recommendation.  Pt verbalized understanding and was agreeable to f/u with PCP.

## 2019-08-19 ENCOUNTER — Encounter: Payer: 59 | Attending: Neurological Surgery | Admitting: Registered"

## 2019-08-19 ENCOUNTER — Encounter: Payer: Self-pay | Admitting: Registered"

## 2019-08-19 ENCOUNTER — Other Ambulatory Visit: Payer: Self-pay

## 2019-08-19 DIAGNOSIS — Z681 Body mass index (BMI) 19 or less, adult: Secondary | ICD-10-CM | POA: Insufficient documentation

## 2019-08-19 DIAGNOSIS — R627 Adult failure to thrive: Secondary | ICD-10-CM | POA: Insufficient documentation

## 2019-08-19 DIAGNOSIS — Z713 Dietary counseling and surveillance: Secondary | ICD-10-CM | POA: Diagnosis not present

## 2019-08-19 NOTE — Patient Instructions (Signed)
-   Continue to try to have 3 meals/day + 3 snacks (Ensure Enlive).   - Add protein to breakfast such as eggs, bacon, sausage, country ham, or whole milk.   - Increase water intake at least 2 bottles/day (32 oz).

## 2019-08-19 NOTE — Progress Notes (Signed)
Appointment start time: 11:40  Appointment end time: 12:10  Patient was seen on 08/19/2019 for nutrition counseling pertaining to disordered eating  Primary care provider: Newman Nip, NP Therapist: none  ROI: N/A Any other medical team members: Autumn Patty, MD  Past medical history: Afib, cardiomyopathy, mitral valve prolapse  Assessment  States she is on the third round of antibiotics related to feet. Reports she is in pain. States she had 5 Ensure Enlive and Ensure Clear yesterday. Later reports she had 4. States she has been going back and forth to doctors' appointments and emergency room related to feet and being on medications. States its has been hard to eat, but trying. States she has reduced [employer] intake to 10-20 oz/day and is more inclined to drink cold Ensure. States she feels like she is not eating a lot and feels her weight has decreased from previous visit, although it has not.    Growth Metrics: Goal rate of weight gain:  0.5-1.0 lb/week  Eating history: Length of time: ~11 years  Previous treatments: none Goals for RD meetings: improve cold intolerance,  dizziness/lightheadedness  Weight history:  Today's weight: 85.2 Changes from previous visit: +0.9 lbs from 84.6 (07/28/2019, 3 weeks ago) Highest weight: 135   Lowest weight: 81 Most consistent weight: 95-100 What would you like to weigh: at least 120 lbs How has weight changed in the past year: up and down fluctuates between 81-95 lbs  Medical Information:  Changes in hair, skin, nails since ED started: bruises easily, shingles on feet Chewing/swallowing difficulties: no Reflux or heartburn: no Trouble with teeth: wears dentures Constipation, diarrhea: no, has BM 1-2x/day Dizziness/lightheadedness: no, states it has improved Headaches/body aches: no Heart racing/chest pain: has PMH Afib, cardiomyopathy, mitral valve prolapse; flutters daily Mood: aggravated with leg tremors Sleep: not good;  sleeps 4-5 hrs Focus/concentration: good Cold intolerance: yes Vision changes: no  Mental health diagnosis:    Dietary assessment: A typical day consists of 3 meals and 2 snacks  Safe foods include: likes a variety of things  Avoided foods include: none   24 hour recall: poor historian B (10 am): English muffin with butter and jelly + hot tea S:  L (12-1 pm): 1/4 cheetos poppables, 2 c watermelon, 12 cheese and crackers + Ensure Enlive S: Ensure Enlive D (7pm): 1/2 of chicken breast  S: 2 Ensure Enlive + cheetos  Beverages: hot tea (with sugar and whole milk; 12 oz), mountain dew (10-20 oz), water (1-2 glasses)  What Methods Do You Use To Control Your Weight (Compensatory behaviors)?  none  Estimated energy intake: 2900-3000 kcal  Estimated energy needs: 1800-2200 kcal 225-275 g CHO 90-110 g pro 60-73 g fat  Nutrition Diagnosis: NB-1.1 Food and nutrition-related knowledge deficit As related to skipping meals.  As evidenced by dietary recall.  Intervention/Goals: Pt was educated and counseled on eating to nourish the body, signs/symptoms of not being adequately nourished, and ways to increase nourishment. Discussed when to add in nutritional shakes to her daily routine. Discussed importance of being adequately nourished to help with healing and recovery in her body. Pt was in agreement with goals listed. Goals: - Continue to try to have 3 meals/day + 3 snacks (Ensure Enlive).  - Add protein to breakfast such as eggs, bacon, sausage, country ham, or whole milk.  - Increase water intake at least 2 bottles/day (32 oz).   Monitoring and Evaluation: Patient will follow up in 3 weeks due to providers availability.

## 2019-09-10 ENCOUNTER — Encounter: Payer: 59 | Attending: Neurological Surgery | Admitting: Registered"

## 2019-09-10 ENCOUNTER — Encounter: Payer: Self-pay | Admitting: Registered"

## 2019-09-10 ENCOUNTER — Other Ambulatory Visit: Payer: Self-pay

## 2019-09-10 DIAGNOSIS — R627 Adult failure to thrive: Secondary | ICD-10-CM | POA: Insufficient documentation

## 2019-09-10 DIAGNOSIS — Z681 Body mass index (BMI) 19 or less, adult: Secondary | ICD-10-CM | POA: Diagnosis not present

## 2019-09-10 DIAGNOSIS — Z713 Dietary counseling and surveillance: Secondary | ICD-10-CM | POA: Diagnosis not present

## 2019-09-10 NOTE — Progress Notes (Signed)
Appointment start time: 11:40  Appointment end time: 12:10  Patient was seen on 09/10/2019 for nutrition counseling pertaining to disordered eating  Primary care provider: Newman Nip, NP Therapist: none  ROI: N/A Any other medical team members: Autumn Patty, MD  Past medical history: Afib, cardiomyopathy, mitral valve prolapse  Assessment  Pt arrives carrying bottle of [facility] that has a few sips taken from it. States she has been eating better. States she had spaghetti the other night and it did not agree with her stomach due to gluten. States it was good but upset her stomach. Reports she is continuing to drink Ensure Plus 2x/day and Ensure Clear in the morning. States she has been drinking more water than anything. Pt states she will put 3 Ensure Clear in water bottle and drink during the day while on-the-go.   Previous visit: States she has reduced [employer] intake to 10-20 oz/day and is more inclined to drink cold Ensure. States she feels like she is not eating a lot and feels her weight has decreased from previous visit, although it has not.    Growth Metrics: Goal rate of weight gain:  0.5-1.0 lb/week  Eating history: Length of time: ~11 years  Previous treatments: none Goals for RD meetings: improve cold intolerance,  dizziness/lightheadedness  Weight history:  Today's weight: 83.6 Changes from previous visit: -1.6 lbs from 85.2 (08/19/2019, 3 weeks ago) Highest weight: 135   Lowest weight: 81 Most consistent weight: 95-100 What would you like to weigh: at least 120 lbs How has weight changed in the past year: up and down fluctuates between 81-95 lbs  Medical Information:  Changes in hair, skin, nails since ED started: bruises easily, shingles on feet Chewing/swallowing difficulties: no Reflux or heartburn: no Trouble with teeth: wears dentures Constipation, diarrhea: no, has BM 1-2x/day Dizziness/lightheadedness: no, states it has improved Headaches/body  aches: no Heart racing/chest pain: has PMH Afib, cardiomyopathy, mitral valve prolapse; flutters daily Mood: aggravated with leg tremors Sleep: not good; sleeps 4-5 hrs Focus/concentration: good Cold intolerance: yes Vision changes: no  Mental health diagnosis:    Dietary assessment: A typical day consists of 3 meals and 2 snacks  Safe foods include: likes a variety of things  Avoided foods include: none   24 hour recall: poor historian B (10 am): ham biscuit + hot tea S: Ensure Clear L (12-1 pm): chips + egg salad sandwich  S: Ensure Plus + bowl of cheez-its S (4 pm): bowl of watermelon D (7pm): 1 personal pepperoni deep dish pizza  S: Ensure Plus  Beverages: hot tea (with sugar and whole milk; 12 oz), mountain dew (10-20 oz), water (1-2 glasses)  What Methods Do You Use To Control Your Weight (Compensatory behaviors)?  none  Estimated energy intake: 2400-2500 kcal  Estimated energy needs: 1800-2200 kcal 225-275 g CHO 90-110 g pro 60-73 g fat  Nutrition Diagnosis: NB-1.1 Food and nutrition-related knowledge deficit As related to skipping meals.  As evidenced by dietary recall.  Intervention/Goals: Pt was educated and counseled on eating to nourish the body and ways to increase nourishment. Discussed whole fat dairy options to be included in each meal, 3 times/day. Pt was in agreement with goals listed. Goals: - Have whole fat dairy with meals (breakfast, lunch, and dinner) such as cheese, milk, or yogurt.  -  Sip on water rather than [facility] throughout the day. Have with lunch.  - Keep food record.   Monitoring and Evaluation: Patient will follow up in 1  month due to providers availability.

## 2019-09-10 NOTE — Patient Instructions (Addendum)
-   Have whole fat dairy with meals (breakfast, lunch, and dinner) such as cheese, milk, or yogurt.   -  Sip on water rather than [facility] throughout the day. Have with lunch.   - Keep food record.

## 2019-10-14 ENCOUNTER — Ambulatory Visit: Payer: 59 | Admitting: Registered"

## 2019-11-03 ENCOUNTER — Other Ambulatory Visit: Payer: Self-pay

## 2019-11-03 ENCOUNTER — Encounter: Payer: 59 | Attending: Neurological Surgery | Admitting: Registered"

## 2019-11-03 DIAGNOSIS — Z713 Dietary counseling and surveillance: Secondary | ICD-10-CM | POA: Diagnosis present

## 2019-11-03 DIAGNOSIS — Z681 Body mass index (BMI) 19 or less, adult: Secondary | ICD-10-CM | POA: Diagnosis not present

## 2019-11-03 DIAGNOSIS — R627 Adult failure to thrive: Secondary | ICD-10-CM | POA: Insufficient documentation

## 2019-11-03 NOTE — Progress Notes (Signed)
Appointment start time: 5:03 Appointment end time: 5:18  Patient was seen on 11/03/2019 for nutrition counseling pertaining to disordered eating  Primary care provider: Newman Nip, NP Therapist: none  ROI: N/A Any other medical team members: Autumn Patty, MD  Past medical history: Afib, cardiomyopathy, mitral valve prolapse  Assessment  Pt arrives stating she has thrush in her mouth. Reports having challenges with eating and drinking. States she has been taking steroids and antibiotics because of her feet.  Arrives carrying [employer] in her hand. Reports she can eat applesauce, bread, pimento cheese sandwiches, potatoes, and chicken noodle soup. States she had 5 Ensure Plus yesterday and 2 iced teas.   Previous visit: States she has reduced [employer] intake to 10-20 oz/day and is more inclined to drink cold Ensure. States she feels like she is not eating a lot and feels her weight has decreased from previous visit, although it has not.    Growth Metrics: Goal rate of weight gain:  0.5-1.0 lb/week  Eating history: Length of time: ~11 years  Previous treatments: none Goals for RD meetings: improve cold intolerance,  dizziness/lightheadedness  Weight history:  Today's weight: 84.4  Changes from previous visit: + 0.8 lbs from 83.6 (09/10/2019, 3 weeks ago) Highest weight: 135   Lowest weight: 81 Most consistent weight: 95-100 What would you like to weigh: at least 120 lbs How has weight changed in the past year: up and down fluctuates between 81-95 lbs  Medical Information:  Changes in hair, skin, nails since ED started: bruises easily, shingles on feet Chewing/swallowing difficulties: no Reflux or heartburn: no Trouble with teeth: wears dentures Constipation, diarrhea: no, has BM 1-2x/day Dizziness/lightheadedness: no, states it has improved Headaches/body aches: no Heart racing/chest pain: has PMH Afib, cardiomyopathy, mitral valve prolapse; flutters daily Mood:  aggravated with leg tremors Sleep: not good; sleeps 4-5 hrs Focus/concentration: good Cold intolerance: yes Vision changes: no  Mental health diagnosis:    Dietary assessment: A typical day consists of 3 meals and 2 snacks  Safe foods include: likes a variety of things  Avoided foods include: none   24 hour recall: poor historian B (10 am): S:  L (12-1 pm):  S:  S (4 pm):  D (7pm): sloppy joe sandwich + tater tots S:   Beverages: Ensure Plus (5 bottles although did not report during recall)  What Methods Do You Use To Control Your Weight (Compensatory behaviors)?  none  Estimated energy intake: [phone] (if including 5 Ensure Plus) kcal  Estimated energy needs: 1800-2200 kcal 225-275 g CHO 90-110 g pro 60-73 g fat  Nutrition Diagnosis: NB-1.1 Food and nutrition-related knowledge deficit As related to skipping meals.  As evidenced by dietary recall.  Intervention/Goals: Pt was educated and counseled on eating to nourish the body and ways to increase nourishment. Discussed eating what she is able to tolerate and making sure to drink Ensure supplements. Pt was in agreement with goals listed. Goals: - Try oatmeal with butter + Ensure for breakfast - Try pimento cheese or tuna sandwich for lunch - Try chicken noodle soup with baked potato/mashed potatoes for dinner.  - Have at least 3 Ensures/day.   Monitoring and Evaluation: Patient will follow up in 1 month.

## 2019-11-03 NOTE — Patient Instructions (Addendum)
-   Try oatmeal with butter + Ensure for breakfast  - Try pimento cheese or tuna sandwich for lunch  - Try chicken noodle soup with baked potato/mashed potatoes for dinner.   - Have at least 3 Ensures/day.

## 2019-12-03 ENCOUNTER — Ambulatory Visit: Payer: 59 | Admitting: Registered"

## 2020-01-05 ENCOUNTER — Other Ambulatory Visit: Payer: Self-pay | Admitting: Physical Medicine and Rehabilitation

## 2020-01-05 ENCOUNTER — Other Ambulatory Visit: Payer: Self-pay | Admitting: Physical Therapy

## 2020-01-05 DIAGNOSIS — M25551 Pain in right hip: Secondary | ICD-10-CM

## 2020-01-18 ENCOUNTER — Ambulatory Visit
Admission: RE | Admit: 2020-01-18 | Discharge: 2020-01-18 | Disposition: A | Discharge status: Home / Self Care | Payer: 59 | Source: Ambulatory Visit | Attending: Physical Medicine and Rehabilitation | Admitting: Physical Medicine and Rehabilitation

## 2020-01-18 ENCOUNTER — Other Ambulatory Visit: Payer: Self-pay

## 2020-01-18 DIAGNOSIS — M25551 Pain in right hip: Secondary | ICD-10-CM

## 2020-01-18 MED ORDER — GADOBENATE DIMEGLUMINE 529 MG/ML IV SOLN
7.0000 mL | Freq: Once | INTRAVENOUS | Status: AC | PRN
  Administered 2020-01-18: 7 mL via INTRAVENOUS

## 2020-01-27 ENCOUNTER — Other Ambulatory Visit: Payer: 59

## 2020-11-27 ENCOUNTER — Encounter: Payer: Self-pay | Admitting: Emergency Medicine

## 2020-11-27 ENCOUNTER — Other Ambulatory Visit: Payer: Self-pay

## 2020-11-27 ENCOUNTER — Ambulatory Visit
Admission: EM | Admit: 2020-11-27 | Discharge: 2020-11-27 | Disposition: A | Discharge status: Home / Self Care | Payer: 59 | Attending: Internal Medicine | Admitting: Internal Medicine

## 2020-11-27 DIAGNOSIS — J069 Acute upper respiratory infection, unspecified: Secondary | ICD-10-CM | POA: Diagnosis present

## 2020-11-27 DIAGNOSIS — R300 Dysuria: Secondary | ICD-10-CM

## 2020-11-27 DIAGNOSIS — R059 Cough, unspecified: Secondary | ICD-10-CM | POA: Diagnosis present

## 2020-11-27 DIAGNOSIS — N3001 Acute cystitis with hematuria: Secondary | ICD-10-CM

## 2020-11-27 DIAGNOSIS — J029 Acute pharyngitis, unspecified: Secondary | ICD-10-CM | POA: Diagnosis present

## 2020-11-27 LAB — POCT URINALYSIS DIP (MANUAL ENTRY)
Bilirubin, UA: NEGATIVE
Glucose, UA: NEGATIVE mg/dL
Ketones, POC UA: NEGATIVE mg/dL
Nitrite, UA: POSITIVE — AB
Protein Ur, POC: 100 mg/dL — AB
Spec Grav, UA: 1.025 (ref 1.010–1.025)
Urobilinogen, UA: 0.2 E.U./dL
pH, UA: 6 (ref 5.0–8.0)

## 2020-11-27 LAB — POCT RAPID STREP A (OFFICE): Rapid Strep A Screen: NEGATIVE

## 2020-11-27 MED ORDER — CEFDINIR 300 MG PO CAPS: 300.0000 mg | ORAL_CAPSULE | Freq: Two times a day (BID) | ORAL | 0 refills | Status: AC

## 2020-11-27 MED ORDER — DM-GUAIFENESIN ER 30-600 MG PO TB12: 1.0000 | ORAL_TABLET | Freq: Two times a day (BID) | ORAL | 0 refills | Status: AC

## 2020-11-27 MED ORDER — ALBUTEROL SULFATE HFA 108 (90 BASE) MCG/ACT IN AERS: 1.0000 | INHALATION_SPRAY | Freq: Four times a day (QID) | RESPIRATORY_TRACT | 0 refills | Status: AC | PRN

## 2020-11-27 NOTE — ED Provider Notes (Signed)
[facility]    CSN: [id number] Arrival date & time: 11/27/20  1508      History   Chief Complaint Chief Complaint  Patient presents with   Fever   Cough   Abdominal Pain    HPI Christine Roberson is a 55 y.o. female.   Patient presents with 1 week history of harsh cough, fever, nasal congestion, sore throat. Family members have similar symptoms. Has intermittent shortness of breath but denies chest pain. Temp max at home was 101. Has taken tylenol for fever. Also having some lower abdominal pain and urinary burning. Has history of frequent UTI's with most recent being in June. Denies any back pain.    Fever Cough Abdominal Pain  Past Medical History:  Diagnosis Date   Arthritis    Chronic back pain    DDD (degenerative disc disease), lumbar    Headache    Hypertension    MVP (mitral valve prolapse)     There are no problems to display for this patient.   Past Surgical History:  Procedure Laterality Date   ABDOMINAL HYSTERECTOMY     CLOSED MANIPULATION SHOULDER WITH STERIOD INJECTION Right 03/05/2017   Procedure: CLOSED MANIPULATION SHOULDER WITH STEROID INJECTION;  Surgeon: Sheral Apley, MD;  Location: [facility];  Service: Orthopedics;  Laterality: Right;   JOINT REPLACEMENT Right    TKR   KNEE ARTHROSCOPY Bilateral    SPINAL CORD STIMULATOR IMPLANT     SPINAL CORD STIMULATOR REMOVAL     TONSILLECTOMY     TOTAL HIP ARTHROPLASTY Right     OB History   No obstetric history on file.      Home Medications    Prior to Admission medications   Medication Sig Start Date End Date Taking? Authorizing Provider  albuterol (VENTOLIN HFA) 108 (90 Base) MCG/ACT inhaler Inhale 1-2 puffs into the lungs every 6 (six) hours as needed for wheezing or shortness of breath. 11/27/20  Yes Lance Muss, FNP  cefdinir (OMNICEF) 300 MG capsule Take 1 capsule (300 mg total) by mouth 2 (two) times daily for 10 days. 11/27/20 12/07/20 Yes Lance Muss, FNP  dextromethorphan-guaiFENesin (MUCINEX DM) 30-600 MG 12hr tablet Take 1 tablet by mouth 2 (two) times daily. 11/27/20  Yes Lance Muss, FNP  ALPRAZolam Prudy Feeler) 0.5 MG tablet Take 1-2 pills as needed on call to MRI; may take a third pill if needed. 07/23/19   Huston Foley, MD  cetirizine (ZYRTEC) 10 MG tablet Take 10 mg by mouth daily.    [provider]  doxycycline (ORACEA) 40 MG capsule Take 40 mg by mouth every morning.    [provider]  ergocalciferol (VITAMIN D2) 1.25 MG (50000 UT) capsule Take by mouth. 08/24/14   [provider]  estradiol (CLIMARA - DOSED IN MG/24 HR) 0.1 mg/24hr patch Place 0.1 mg onto the skin once a week.    [provider]  Estradiol Acetate (FEMRING) 0.1 MG/24HR RING Place vaginally.    [provider]  ketoconazole (NIZORAL) 2 % cream Apply thin film once daily for two weeks 03/11/19   Lattie Haw, MD  loratadine (CLARITIN) 10 MG tablet Take 10 mg by mouth daily.    [provider]  magnesium oxide (MAG-OX) 400 MG tablet Take 400 mg by mouth daily.    [provider]  oxyCODONE-acetaminophen (PERCOCET/ROXICET) 5-325 MG tablet Take 1 tablet by mouth every 4 (four) hours as needed for severe pain.  [provider]  tiZANidine (ZANAFLEX) 2 MG tablet Take 4 mg by mouth 3 (three) times daily as needed.     [provider]    Family History Family History  Problem Relation Age of Onset   Heart disease Other     Social History Social History   Tobacco Use   Smoking status: Former    Types: Cigarettes    Quit date: 11/20/2015    Years since quitting: 5.0   Smokeless tobacco: Never  Vaping Use   Vaping Use: Never used  Substance Use Topics   Alcohol use: No   Drug use: No     Allergies   Demerol [meperidine], Dilaudid [hydromorphone], Morphine and related, Neosporin [neomycin-bacitracin zn-polymyx], Prednisone, Miconazole nitrate, Vicodin  [hydrocodone-acetaminophen], and Vistaril [hydroxyzine]   Review of Systems Review of Systems Per HPI  Physical Exam Triage Vital Signs ED Triage Vitals [11/27/20 1539]  Enc Vitals Group     BP (!) 92/59     Pulse Rate 95     Resp 18     Temp 97.6 F (36.4 C)     Temp Source Oral     SpO2 98 %     Weight      Height      Head Circumference      Peak Flow      Pain Score 6     Pain Loc      Pain Edu?      Excl. in GC?    No data found.  Updated Vital Signs BP (!) 92/59 (BP Location: Right Arm)   Pulse 95   Temp 97.6 F (36.4 C) (Oral)   Resp 18   SpO2 98%   Visual Acuity Right Eye Distance:   Left Eye Distance:   Bilateral Distance:    Right Eye Near:   Left Eye Near:    Bilateral Near:     Physical Exam Constitutional:      General: She is not in acute distress.    Appearance: Normal appearance.  HENT:     Head: Normocephalic and atraumatic.     Right Ear: Tympanic membrane and ear canal normal.     Left Ear: Tympanic membrane and ear canal normal.     Nose: Congestion present.     Mouth/Throat:     Mouth: Mucous membranes are moist.     Pharynx: Posterior oropharyngeal erythema present.  Eyes:     Extraocular Movements: Extraocular movements intact.     Conjunctiva/sclera: Conjunctivae normal.     Pupils: Pupils are equal, round, and reactive to light.  Cardiovascular:     Rate and Rhythm: Normal rate and regular rhythm.     Pulses: Normal pulses.     Heart sounds: Normal heart sounds.  Pulmonary:     Effort: Pulmonary effort is normal. No respiratory distress.     Breath sounds: Normal breath sounds. No wheezing or rhonchi.  Abdominal:     General: Abdomen is flat. Bowel sounds are normal. There is no distension.     Palpations: Abdomen is soft.     Tenderness: There is no abdominal tenderness.  Musculoskeletal:        General: Normal range of motion.     Cervical back: Normal range of motion.  Skin:    General: Skin is warm and dry.   Neurological:     General: No focal deficit present.     Mental Status: She is alert and oriented to person, place, and  time. Mental status is at baseline.  Psychiatric:        Mood and Affect: Mood normal.        Behavior: Behavior normal.     UC Treatments / Results  Labs (all labs ordered are listed, but only abnormal results are displayed) Labs Reviewed  POCT URINALYSIS DIP (MANUAL ENTRY) - Abnormal; Notable for the following components:      Result Value   Clarity, UA cloudy (*)    Blood, UA small (*)    Protein Ur, POC =100 (*)    Nitrite, UA Positive (*)    Leukocytes, UA Large (3+) (*)    All other components within normal limits  URINE CULTURE  NOVEL CORONAVIRUS, NAA  CULTURE, GROUP A STREP [facility])  POCT RAPID STREP A (OFFICE)    EKG   Radiology No results found.  Procedures Procedures (including critical care time)  Medications Ordered in UC Medications - No data to display  Initial Impression / Assessment and Plan / UC Course  I have reviewed the triage vital signs and the nursing notes.  Pertinent labs & imaging results that were available during my care of the patient were reviewed by me and considered in my medical decision making (see chart for details).     Urinalysis showing signs of UTI. Patient was not able to provide enough urine for urine culture. Future order placed and patient was provided with specimen cup for patient to bring back in urine sample.   Rapid strep test was negative. Throat culture and covid 19 swabs are pending. Patient is nontoxic appearing and does not appear to be in any distress. Suggested chest x-ray but x-ray tech is not available at this time and patient wishes to forego outpatient imaging at this time. Will treat with cefdinir as this will cover for UTI and respiratory infection. Also prescribed mucinex DM and albuterol inhaler. Advised patient to go to the hospital if shortness of breath worsens. Unable to prescribe  prednisone due to allergy. Patient states that her BP is normally low. Discussed strict return precautions. Patient verbalized understanding and is agreeable with plan.  Final Clinical Impressions(s) / UC Diagnoses   Final diagnoses:  Dysuria  Acute upper respiratory infection  Sore throat  Acute cystitis with hematuria  Cough     Discharge Instructions      Your urine did show signs of UTI. You are being treated with cefdinir antibiotic that will treat UTI and upper respiratory infection. Your rapid strep test was negative. Throat culture and covid 19 viral swab are pending. We will call if this is positive. Please bring back urine culture sample.      ED Prescriptions     Medication Sig Dispense Auth. Provider   cefdinir (OMNICEF) 300 MG capsule Take 1 capsule (300 mg total) by mouth 2 (two) times daily for 10 days. 20 capsule Lance Muss, FNP   albuterol (VENTOLIN HFA) 108 (90 Base) MCG/ACT inhaler Inhale 1-2 puffs into the lungs every 6 (six) hours as needed for wheezing or shortness of breath. 1 each Lance Muss, FNP   dextromethorphan-guaiFENesin [facility] DM) 30-600 MG 12hr tablet Take 1 tablet by mouth 2 (two) times daily. 30 tablet Lance Muss, FNP      PDMP not reviewed this encounter.   Lance Muss, FNP 11/27/20 [phone]

## 2020-11-27 NOTE — Discharge Instructions (Addendum)
Your urine did show signs of UTI. You are being treated with cefdinir antibiotic that will treat UTI and upper respiratory infection. Your rapid strep test was negative. Throat culture and covid 19 viral swab are pending. We will call if this is positive. Please bring back urine culture sample.

## 2020-11-27 NOTE — ED Triage Notes (Signed)
C/o abdominal pain x 4 days with dysuria. Also states she's having fever, cough, sore throat, runny nose after being around sick family members last week. URI symptoms on-going x 1 week.

## 2020-11-28 LAB — SARS-COV-2, NAA 2 DAY TAT

## 2020-11-28 LAB — NOVEL CORONAVIRUS, NAA: SARS-CoV-2, NAA: DETECTED — AB

## 2020-12-01 LAB — CULTURE, GROUP A STREP (THRC)

## 2020-12-19 ENCOUNTER — Telehealth (HOSPITAL_COMMUNITY): Payer: Self-pay | Admitting: Emergency Medicine

## 2020-12-19 NOTE — Telephone Encounter (Signed)
Per Rolly Salter, APP, please call to check on patient.   Attempted to reach patient x 1, LVM

## 2020-12-19 NOTE — Telephone Encounter (Signed)
Patient returned call.  States she is having shortness of breath with exertion post-COVID.  States she is also still having some urinary symptoms.  Encouraged re-evaluation with PCP or [place].  Patient states she will try to return to clinic this evening.

## 2020-12-19 NOTE — Progress Notes (Signed)
Patient was not able to provide enough urine for urine culture at time of visit. Was sent home with specimen cup to return urine sample but patient never did. Orpha Bur, RN attempted to contact patient in regards to urinary symptoms but patient did not answer.

## 2020-12-24 IMAGING — CT CT L SPINE W/ CM
1 of 7 series · 5 of 14 positions shown, 7 images · non-contrast
Comparison: MRI lumbar spine dated March 09, 2018.

CLINICAL DATA: Three-month history of right greater than left leg
tremor with significant leg cramping, numbness, and tingling when
standing or walking.
TECHNIQUE: Contiguous axial images were obtained through the lumbar spine after
the intrathecal infusion of contrast. Coronal and sagittal
reconstructions were obtained of the axial image sets.

[Series 3: l spine soft · axial · 0.31mm/px · z∈[-295,-133]mm · 5 of 82 slices shown, 7 images]
[im 14/82  soft-tissue]
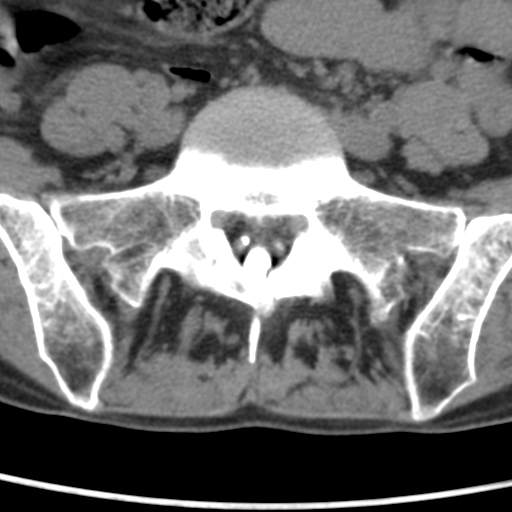
[im 14/82  bone]
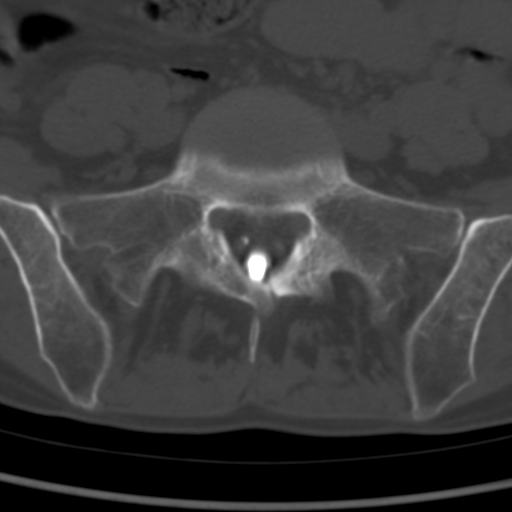
[im 28/82  bone]
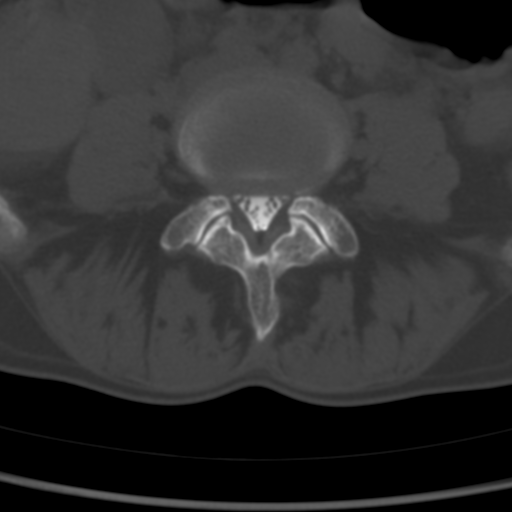
[im 41/82  bone]
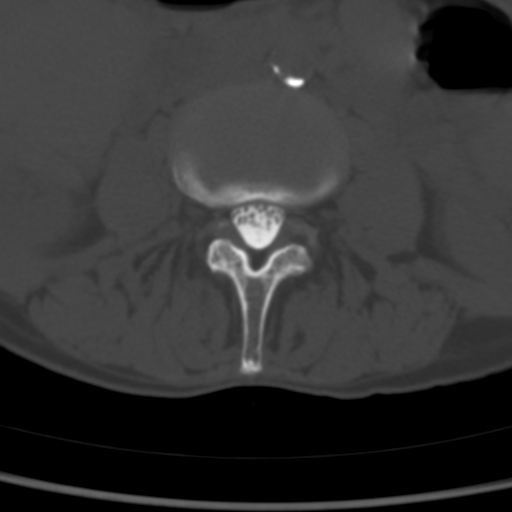
[im 55/82  bone]
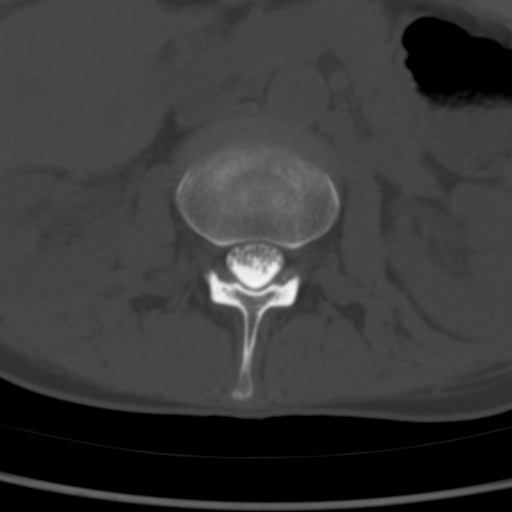
[im 68/82  soft-tissue]
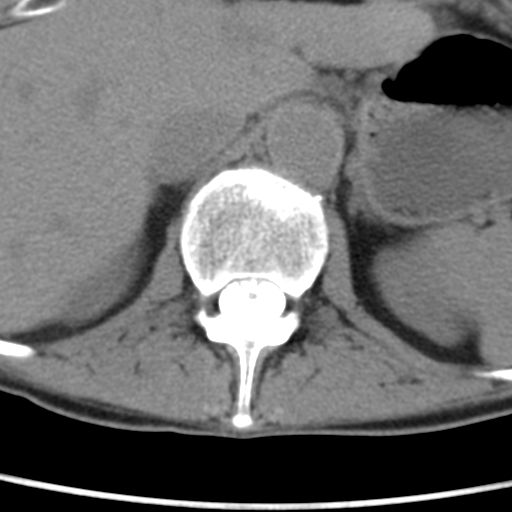
[im 68/82  bone]
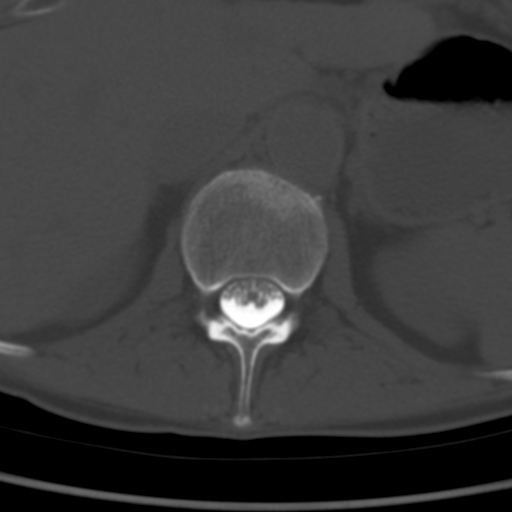

[5 of 14 positions shown; findings below may reference images not displayed]

EXAM:
LUMBAR MYELOGRAM

CT LUMBAR MYELOGRAM

FLUOROSCOPY TIME:  Radiation Exposure Index (as provided by the
fluoroscopic device): 4.8 mGy

Number of Acquired Images:  16

PROCEDURE:
After thorough discussion of risks and benefits of the procedure
including bleeding, infection, injury to nerves, blood vessels,
adjacent structures as well as headache and CSF leak, written and
oral informed consent was obtained. Consent was obtained by Dr.
Tii Kartel Von-Mally. Time out form was completed.

Patient was positioned prone on the fluoroscopy table. Local
anesthesia was provided with 1% lidocaine without epinephrine after
prepped and draped in the usual sterile fashion. Puncture was
performed at L2-L3 using a 3 1/2 inch 22-gauge spinal needle via
right paramedian approach. Using a single pass through the dura, the
needle was placed within the thecal sac, with return of clear CSF.
15 mL of Isovue L-GII was injected into the thecal sac, with normal
opacification of the nerve roots and cauda equina consistent with
free flow within the subarachnoid space.

I personally performed the lumbar puncture and administered the
intrathecal contrast. I also personally supervised acquisition of
the myelogram images.
FINDINGS: LUMBAR MYELOGRAM FINDINGS:

Normal alignment. No dynamic instability. Small ventral extradural
defects from L2-L3 through L4-L5 are unchanged with standing,
flexion, or extension. Mild bilateral lateral recess narrowing at
L4-L5. No spinal canal stenosis. No nerve root effacement.

CT LUMBAR MYELOGRAM FINDINGS:

Segmentation: 5 lumbar type vertebral bodies.

Alignment: Physiologic.

Vertebrae: No acute fracture or focal pathologic process.

Conus medullaris and cauda equina: Conus extends to the L1-L2 level.
Conus and cauda equina appear normal.

Paraspinal and other soft tissues: Aortoiliac atherosclerosis.
Otherwise negative.

Disc levels:

T12-L1:  Negative.

L1-L2:  Negative.

L2-L3:  Minimal disc bulge.  No stenosis.

L3-L4:  Minimal disc bulge.  No stenosis.

L4-L5: Mild disc bulge. Mild bilateral facet arthropathy and
ligamentum flavum hypertrophy. Moderate bilateral lateral recess
stenosis. Borderline mild spinal canal stenosis. No neuroforaminal
stenosis.

L5-S1: Minimal disc bulge. Mild bilateral facet arthropathy. No
stenosis.
IMPRESSION: 1. Unchanged mild lumbar spondylosis. Moderate bilateral lateral
recess stenosis at L4-L5 could affect the descending L5 nerve roots.

## 2021-04-18 ENCOUNTER — Encounter: Payer: Self-pay | Admitting: Physical Therapy

## 2021-04-18 ENCOUNTER — Other Ambulatory Visit: Payer: Self-pay

## 2021-04-18 ENCOUNTER — Ambulatory Visit: Payer: Commercial Managed Care - HMO | Attending: Adult Reconstructive Orthopaedic Surgery | Admitting: Physical Therapy

## 2021-04-18 DIAGNOSIS — M25552 Pain in left hip: Secondary | ICD-10-CM | POA: Insufficient documentation

## 2021-04-18 DIAGNOSIS — R2689 Other abnormalities of gait and mobility: Secondary | ICD-10-CM | POA: Diagnosis present

## 2021-04-18 DIAGNOSIS — M6281 Muscle weakness (generalized): Secondary | ICD-10-CM | POA: Diagnosis present

## 2021-04-18 NOTE — Therapy (Signed)
OUTPATIENT PHYSICAL THERAPY LOWER EXTREMITY EVALUATION   Patient Name: Christine Roberson MRN: [id number] DOB:03/25/1966, 56 y.o., female Today's Date: 04/18/2021   PT End of Session - 04/18/21 1424     Visit Number 1    Number of Visits 13    Date for PT Re-Evaluation 05/30/21    PT Start Time 1330    PT Stop Time 1415    PT Time Calculation (min) 45 min    Activity Tolerance Patient tolerated treatment well    Behavior During Therapy WFL for tasks assessed/performed             Past Medical History:  Diagnosis Date   Arthritis    Chronic back pain    DDD (degenerative disc disease), lumbar    Headache    Hypertension    MVP (mitral valve prolapse)    Past Surgical History:  Procedure Laterality Date   ABDOMINAL HYSTERECTOMY     CLOSED MANIPULATION SHOULDER WITH STERIOD INJECTION Right 03/05/2017   Procedure: CLOSED MANIPULATION SHOULDER WITH STEROID INJECTION;  Surgeon: Renette Butters, MD;  Location: [facility];  Service: Orthopedics;  Laterality: Right;   JOINT REPLACEMENT Right    TKR   KNEE ARTHROSCOPY Bilateral    SPINAL CORD STIMULATOR IMPLANT     SPINAL CORD STIMULATOR REMOVAL     TONSILLECTOMY     TOTAL HIP ARTHROPLASTY Right    There are no problems to display for this patient.   PCP: Daleen Snook, NP  REFERRING PROVIDER: Rosanne Ashing, MD  REFERRING DIAG: Unilateral primary osteoarthritis, left hip [M16.12]  THERAPY DIAG:  Pain in left hip  Muscle weakness (generalized)  Other abnormalities of gait and mobility  ONSET DATE: 6 months ago  SUBJECTIVE:   SUBJECTIVE STATEMENT: Pt reports L hip that started several months ago with no report MOI. She has a hx of THA on the R side a couple months ago. She reports the plan is to undergo pre-hab for the L hip and come back following the THA 2/14.   PERTINENT HISTORY: R THA  PAIN:  Are you having pain? Yes NPRS scale: 6/10 Pain location: L hip Pain orientation: Left  Pain  description: constant and sharp  Aggravating factors: standing/ walking Relieving factors: icing as able  PRECAUTIONS: None  WEIGHT BEARING RESTRICTIONS No  FALLS:  Has patient fallen in last 6 months? No, Number of falls:   LIVING ENVIRONMENT: Lives with: lives with their family Lives in: House/apartment Stairs: No;  Has following equipment at home: Single point cane, Environmental consultant - 2 wheeled, and BSC  OCCUPATION: Disability   PLOF: Independent with basic ADLs  PATIENT GOALS "To get throught this mess"   OBJECTIVE:  **Measurements assessed at evaluation unless otherwise noted by date** DIAGNOSTIC FINDINGS:  MRI 01/18/2020 IMPRESSION: No acute abnormality. Degenerative tear of the anterior, superior right labrum.  PATIENT SURVEYS:  FOTO 50%, predicted 70%  COGNITION:  Overall cognitive status: Within functional limits for tasks assessed     SENSATION:  Light touch: Appears intact  POSTURE:  Forward head position  PALPATION: TTP along the glute med/ and along the anterior aspect of the acetabulofemoral joint  LE AROM/PROM:  A/PROM Right 04/18/2021 Left 04/18/2021  Hip flexion [facility] WFL *  Hip extension [facility] WFL *  Hip abduction [facility] WFL *  Hip adduction [facility] WFL *   Hip internal rotation    Hip external rotation    Knee flexion    Knee extension    Ankle  dorsiflexion    Ankle plantarflexion    Ankle inversion    Ankle eversion     (Blank rows = not tested, * pain during assessment)  LE MMT:  MMT Right 04/18/2021 Left 04/18/2021  Hip flexion 4/5 4/-5*  Hip extension 4-/5 3/5*  Hip abduction 4-/5 3+/5*  Hip adduction 4-/5 4-/5*  Hip internal rotation    Hip external rotation    Knee flexion    Knee extension    Ankle dorsiflexion    Ankle plantarflexion    Ankle inversion    Ankle eversion     (Blank rows = not tested, *pain during testing )  LOWER EXTREMITY SPECIAL TESTS:  N/A  FUNCTIONAL TESTS:  5 times sit to stand: 18  seconds  GAIT: Assistive device utilized: None Level of assistance: Complete Independence Comments: decreased stride with LLE and reduced stance on LLE    TODAY'S TREATMENT:  [facility] Adult PT Treatment:                                                DATE: 04/18/2021  Therapeutic Exercise: Supine marching with band 1 x 10 LLE with GTB Clamshell 1 x 10 with GTB Bridge 1 x 10 SLR 1 x 10 on the LLE   PATIENT EDUCATION:  Education details: evaluation findings, POC, goals, HEP Person educated: Patient Education method: Explanation Education comprehension: verbalized understanding   HOME EXERCISE PROGRAM: Access Code: [email or web address] URL: [email or web address] Date: 04/18/2021 Prepared by: Starr Lake  Exercises Supine March with Resistance Band - 1 x daily - 7 x weekly - 2 sets - 10 reps SLR - 1 x daily - 7 x weekly - 2 sets - 12 reps - 1 hold Hooklying Clamshell with Resistance - 1 x daily - 7 x weekly - 2 sets - 10 reps Sidelying Hip Abduction (Mirrored) - 1 x daily - 7 x weekly - 3 sets - 10 reps Bridge - 1 x daily - 7 x weekly - 2 sets - 12 reps Sit to Stand - 1 x daily - 7 x weekly - 2 sets - 12 reps Seated Hip Adduction Isometrics with Ball - 1 x daily - 7 x weekly - 2 sets - 10 reps - 5 seconds hold   ASSESSMENT:  CLINICAL IMPRESSION: Patient is a 56 y.o. F who was seen today for physical therapy evaluation and treatment for L hip pain. She has functional hip mobility with report of pain during assessment, and gross L hip weakness secondary to pain. Per MD referral the plan to have 1 pre-rehabilitation, and have surgery on 05/16/2021 and plan to return following surgery the week of 05/18/2020 and plan to perform a reassessment due to change in status.  Objective impairments include Abnormal gait, decreased activity tolerance, decreased balance, decreased endurance, difficulty walking, decreased strength, increased edema, increased muscle spasms, postural  dysfunction, and pain. These impairments are limiting patient from driving, meal prep, laundry, yard work, and shopping. Personal factors including Age and 1-2 comorbidities: hx   are also affecting patient's functional outcome. Patient will benefit from skilled PT to address above impairments and improve overall function.  REHAB POTENTIAL: Good  CLINICAL DECISION MAKING: Stable/uncomplicated  EVALUATION COMPLEXITY: Low   GOALS:  SHORT TERM GOALS:  STG Name Target Date Goal status  1 Pt to be IND with initial HEP to progress PT  Baseline:  05/16/2021 INITIAL  2 Pt to maintain functional hip ROM following surgery.  05/16/2021  INITIAL   LONG TERM GOALS:   LTG Name Target Date Goal status  1 Increase L hip gross strength to >/= 4/5 to promote hip stability and maximize safety  Baseline: 06/13/2021 INITIAL  2 Pt to be able to stand/ walk for >/= 30 min with LRAD demonstrating heel strike/ toe off pattern with min deviation for functional endurance for in home amb and short community distances Baseline: 06/13/2021 INITIAL  3 Pt to increase FOTO score to >/= 70% to demo improvement in function Baseline: 06/13/2021 INITIAL  4 Pt to be IND with all HEP to maintain and progress curreht LOF IND Baseline: 06/13/2021 INITIAL   PLAN: PT FREQUENCY: 2x/week  PT DURATION: 8 weeks  PLANNED INTERVENTIONS: Therapeutic exercises, Therapeutic activity, Neuro Muscular re-education, Balance training, Gait training, Patient/Family education, Joint mobilization, Stair training, DME instructions, Dry Needling, Electrical stimulation, Cryotherapy, Moist heat, Taping, Ionotophoresis 4mg /ml Dexamethasone, and Manual therapy  PLAN FOR NEXT SESSION: Review/ update HEP PRN. Pt was scheduled for THA on 2/14, Perform Re-evaluation since pt is coming following surgery/ update goals based on reassessment.   Leniyah Martell PT, DPT, LAT, ATC  04/18/21  4:01 PM

## 2021-05-17 NOTE — Therapy (Incomplete)
OUTPATIENT PHYSICAL THERAPY TREATMENT NOTE   Patient Name: Christine Roberson MRN: [id number] DOB:Jul 28, 1965, 56 y.o., female Today's Date: 05/17/2021  PCP: Josephina Gip, NP REFERRING PROVIDER: Molly Maduro, MD    Past Medical History:  Diagnosis Date   Arthritis    Chronic back pain    DDD (degenerative disc disease), lumbar    Headache    Hypertension    MVP (mitral valve prolapse)    Past Surgical History:  Procedure Laterality Date   ABDOMINAL HYSTERECTOMY     CLOSED MANIPULATION SHOULDER WITH STERIOD INJECTION Right 03/05/2017   Procedure: CLOSED MANIPULATION SHOULDER WITH STEROID INJECTION;  Surgeon: Sheral Apley, MD;  Location: [facility];  Service: Orthopedics;  Laterality: Right;   JOINT REPLACEMENT Right    TKR   KNEE ARTHROSCOPY Bilateral    SPINAL CORD STIMULATOR IMPLANT     SPINAL CORD STIMULATOR REMOVAL     TONSILLECTOMY     TOTAL HIP ARTHROPLASTY Right    There are no problems to display for this patient.   REFERRING DIAG: Unilateral primary osteoarthritis, left hip [M16.12]  THERAPY DIAG:  No diagnosis found.  PERTINENT HISTORY: R THA , L THA 2/16  PRECAUTIONS: ***  SUBJECTIVE: ***  PAIN:  Are you having pain? {yes/no:20286} NPRS scale: ***/10 Pain location: *** Pain orientation: {Pain Orientation:25161}  PAIN TYPE: {type:313116} Pain description: {PAIN DESCRIPTION:21022940}  Aggravating factors: *** Relieving factors: ***     OBJECTIVE:  **Measurements assessed at evaluation unless otherwise noted by date**  DIAGNOSTIC FINDINGS:  MRI 01/18/2020 IMPRESSION: No acute abnormality. Degenerative tear of the anterior, superior right labrum.  PATIENT SURVEYS:  FOTO 50%, predicted 70%  COGNITION:  Overall cognitive status: Within functional limits for tasks assessed     SENSATION:  Light touch: Appears intact  POSTURE:  Forward head position  PALPATION: TTP along the glute med/ and along the anterior aspect  of the acetabulofemoral joint  LE AROM/PROM:  A/PROM Right 04/18/2021 Left 04/18/2021 Left  05/18/2021  Hip flexion [facility] WFL *   Hip extension [facility] WFL *   Hip abduction [facility] WFL *   Hip adduction WFL WFL *    Hip internal rotation     Hip external rotation     Knee flexion     Knee extension     Ankle dorsiflexion     Ankle plantarflexion     Ankle inversion     Ankle eversion        (Blank rows = not tested, * pain during assessment)  LE MMT:  MMT Right 04/18/2021 Left 04/18/2021 Left  05/18/2021  Hip flexion 4/5 4/-5*   Hip extension 4-/5 3/5*   Hip abduction 4-/5 3+/5*   Hip adduction 4-/5 4-/5*   Hip internal rotation     Hip external rotation     Knee flexion     Knee extension     Ankle dorsiflexion     Ankle plantarflexion     Ankle inversion     Ankle eversion      (Blank rows = not tested, *pain during testing )  LOWER EXTREMITY SPECIAL TESTS:  N/A  FUNCTIONAL TESTS:  5 times sit to stand: 18 seconds  GAIT: Assistive device utilized: None Level of assistance: Complete Independence Comments: decreased stride with LLE and reduced stance on LLE    TODAY'S TREATMENT: [facility] Adult PT Treatment:  DATE: 05/17/2021 Therapeutic Exercise: *** Manual Therapy: *** Neuromuscular re-ed: *** Therapeutic Activity: *** Modalities: *** Self Care: ***   [facility] Adult PT Treatment:                                                DATE: 04/18/2021 Therapeutic Exercise: Supine marching with band 1 x 10 LLE with GTB Clamshell 1 x 10 with GTB Bridge 1 x 10 SLR 1 x 10 on the LLE   PATIENT EDUCATION:  Education details: *** Person educated: Patient Education method: Explanation Education comprehension: verbalized understanding   HOME EXERCISE PROGRAM: Access Code: [email or web address] URL: [email or web address] Date: 04/18/2021 Prepared by: Lulu Riding  Exercises Supine March with Resistance Band - 1 x  daily - 7 x weekly - 2 sets - 10 reps SLR - 1 x daily - 7 x weekly - 2 sets - 12 reps - 1 hold Hooklying Clamshell with Resistance - 1 x daily - 7 x weekly - 2 sets - 10 reps Sidelying Hip Abduction (Mirrored) - 1 x daily - 7 x weekly - 3 sets - 10 reps Bridge - 1 x daily - 7 x weekly - 2 sets - 12 reps Sit to Stand - 1 x daily - 7 x weekly - 2 sets - 12 reps Seated Hip Adduction Isometrics with Ball - 1 x daily - 7 x weekly - 2 sets - 10 reps - 5 seconds hold   ASSESSMENT:  CLINICAL IMPRESSION: ***  IMPAIRMENTS/ DEFICITS: Objective impairments include Abnormal gait, decreased activity tolerance, decreased balance, decreased endurance, difficulty walking, decreased strength, increased edema, increased muscle spasms, postural dysfunction, and pain.  REHAB POTENTIAL: Good  CLINICAL DECISION MAKING: Stable/uncomplicated  EVALUATION COMPLEXITY: Low   GOALS:  SHORT TERM GOALS:  STG Name Target Date Goal status  1 Pt to be IND with initial HEP to progress PT  Baseline:  05/16/2021 INITIAL  2 Pt to maintain functional hip ROM following surgery.  05/16/2021  INITIAL   LONG TERM GOALS:   LTG Name Target Date Goal status  1 Increase L hip gross strength to >/= 4/5 to promote hip stability and maximize safety  Baseline: 06/13/2021 INITIAL  2 Pt to be able to stand/ walk for >/= 30 min with LRAD demonstrating heel strike/ toe off pattern with min deviation for functional endurance for in home amb and short community distances Baseline: 06/13/2021 INITIAL  3 Pt to increase FOTO score to >/= 70% to demo improvement in function Baseline: 06/13/2021 INITIAL  4 Pt to be IND with all HEP to maintain and progress curreht LOF IND Baseline: 06/13/2021 INITIAL   PLAN: PT FREQUENCY: 2x/week  PT DURATION: 8 weeks  PLANNED INTERVENTIONS: Therapeutic exercises, Therapeutic activity, Neuro Muscular re-education, Balance training, Gait training, Patient/Family education, Joint mobilization, Stair  training, DME instructions, Dry Needling, Electrical stimulation, Cryotherapy, Moist heat, Taping, Ionotophoresis 4mg /ml Dexamethasone, and Manual therapy  PLAN FOR NEXT SESSION: Review/ update HEP PRN. Pt was scheduled for THA on 2/14, Perform Re-evaluation since pt is coming following surgery/ update goals based on reassessment. ***   Danta Baumgardner PT, DPT, LAT, ATC  05/17/21  4:24 PM

## 2021-05-18 ENCOUNTER — Ambulatory Visit: Payer: Medicare Other | Admitting: Physical Therapy

## 2021-05-23 ENCOUNTER — Encounter: Payer: Self-pay | Admitting: Physical Therapy

## 2021-05-23 ENCOUNTER — Ambulatory Visit: Payer: Medicare Other | Attending: Adult Reconstructive Orthopaedic Surgery | Admitting: Physical Therapy

## 2021-05-23 ENCOUNTER — Other Ambulatory Visit: Payer: Self-pay

## 2021-05-23 DIAGNOSIS — M25552 Pain in left hip: Secondary | ICD-10-CM | POA: Diagnosis present

## 2021-05-23 DIAGNOSIS — M6281 Muscle weakness (generalized): Secondary | ICD-10-CM | POA: Insufficient documentation

## 2021-05-23 DIAGNOSIS — R2689 Other abnormalities of gait and mobility: Secondary | ICD-10-CM | POA: Insufficient documentation

## 2021-05-23 NOTE — Therapy (Signed)
OUTPATIENT PHYSICAL THERAPY RE-EVALUATION NOTE   Patient Name: Christine Roberson MRN: [id number] DOB:[birth year], 56 y.o., female Today's Date: 05/23/2021  PCP: Josephina Gip, NP REFERRING PROVIDER: Josephina Gip, NP   PT End of Session - 05/23/21 1332     Visit Number 2    Number of Visits 13    Date for PT Re-Evaluation 07/18/21    PT Start Time 1331    PT Stop Time 1414    PT Time Calculation (min) 43 min    Activity Tolerance Patient tolerated treatment well    Behavior During Therapy WFL for tasks assessed/performed             Past Medical History:  Diagnosis Date   Arthritis    Chronic back pain    DDD (degenerative disc disease), lumbar    Headache    Hypertension    MVP (mitral valve prolapse)    Past Surgical History:  Procedure Laterality Date   ABDOMINAL HYSTERECTOMY     CLOSED MANIPULATION SHOULDER WITH STERIOD INJECTION Right 03/05/2017   Procedure: CLOSED MANIPULATION SHOULDER WITH STEROID INJECTION;  Surgeon: Sheral Apley, MD;  Location: [facility];  Service: Orthopedics;  Laterality: Right;   JOINT REPLACEMENT Right    TKR   KNEE ARTHROSCOPY Bilateral    SPINAL CORD STIMULATOR IMPLANT     SPINAL CORD STIMULATOR REMOVAL     TONSILLECTOMY     TOTAL HIP ARTHROPLASTY Right    There are no problems to display for this patient.   REFERRING DIAG: Unilateral primary osteoarthritis, left hip [M16.12]  THERAPY DIAG:  Pain in left hip  Muscle weakness (generalized)  Other abnormalities of gait and mobility  PERTINENT HISTORY: R THA , L THA 2/14  PRECAUTIONS: n/a  SUBJECTIVE: PT arrives back to physical therapy following having a L THA on 2/14. Since the surgery she reports having increased stiffness/ soreness and limited endurance. She reports having pain in the front of the hip and radiates to the knee and sometimes the ankle.   PAIN:  Are you having pain? Yes NPRS scale: 7/10 Pain location: L hip  Pain orientation: Left   PAIN TYPE: aching Pain description: constant  Aggravating factors: standing/ walking Relieving factors: sitting/ laying down, medication     OBJECTIVE:  **Measurements assessed at evaluation unless otherwise noted by date**  DIAGNOSTIC FINDINGS:  MRI 01/18/2020 IMPRESSION: No acute abnormality. Degenerative tear of the anterior, superior right labrum.  PATIENT SURVEYS:  FOTO 50%, predicted 70%  COGNITION:  Overall cognitive status: Within functional limits for tasks assessed     SENSATION:  Light touch: Appears intact  POSTURE:  Forward head position  PALPATION: TTP along the glute med/ and along the anterior aspect of the acetabulofemoral joint, along the proximal adductors, piriformis and proximal rectus femoris.   LE AROM/PROM:  A/PROM Right 04/18/2021 Left 04/18/2021 Left  05/18/2021  Hip flexion [facility] WFL * *60/110  Hip extension [facility] WFL *   Hip abduction [facility] WFL * *32 in supine  Hip adduction WFL WFL *  *WFL  Hip internal rotation     Hip external rotation     Knee flexion     Knee extension     Ankle dorsiflexion     Ankle plantarflexion     Ankle inversion     Ankle eversion        (Blank rows = not tested, * pain during assessment)  LE MMT:  MMT Right 04/18/2021 Left 04/18/2021 Left  05/18/2021  Hip flexion 4/5 4/-5* 3+/5  Hip extension 4-/5 3/5* 3+/5  Hip abduction 4-/5 3+/5* 3+/5  Hip adduction 4-/5 4-/5* 4/5  Hip internal rotation     Hip external rotation     Knee flexion     Knee extension     Ankle dorsiflexion     Ankle plantarflexion     Ankle inversion     Ankle eversion      (Blank rows = not tested, *pain during testing )  LOWER EXTREMITY SPECIAL TESTS:  N/A  FUNCTIONAL TESTS:  5 times sit to stand: 18 seconds 05/23/2021 - unable to perform  GAIT: Assistive device utilized: RW Level of assistance: Complete Independence Comments: antalgic gait with decreaesd stance on the LLE, and stride on the RLE    TODAY'S  TREATMENT:  [facility] Adult PT Treatment:                                                DATE: 05/17/2021 Therapeutic Exercise: Modified thomas test pos L hip flexor stretch 2 x 30 seconds Adductor stretching in hooklying pos 2 x 30 sec Supine heel slide 1 x 10 Clamshell 2 x 10 Glute set with quad set 1 x 10 holding 10 seconds Supine marching 2 x 10 LTR 1 x 15 Manual STW along the distal adductor longus   [facility] Adult PT Treatment:                                                DATE: 04/18/2021 Therapeutic Exercise: Supine marching with band 1 x 10 LLE with GTB Clamshell 1 x 10 with GTB Bridge 1 x 10 SLR 1 x 10 on the LLE   PATIENT EDUCATION:  Education details: re-evaluation findings, POC, goals. reviewed HEP and updated today  Person educated: Patient Education method: Explanation Education comprehension: verbalized understanding   HOME EXERCISE PROGRAM: Access Code: [email or web address] URL: [email or web address] Date: 05/23/2021 Prepared by: Lulu Riding  Exercises Hooklying Clamshell with Resistance - 1 x daily - 7 x weekly - 2 sets - 10 reps Supine Heel Slides (Mirrored) - 1 x daily - 7 x weekly - 2 sets - 10 reps Supine March - 1 x daily - 7 x weekly - 10 reps - 2 sets - 5 hold Supine Quad Set - 1 x daily - 7 x weekly - 2 sets - 10 reps Modified Thomas Stretch (Mirrored) - 2 x daily - 7 x weekly - 2 sets - 2 reps - 30 seconds hold    ASSESSMENT:  CLINICAL IMPRESSION: Pt arrives back to physical therapy following a L THA on 05/16/2021. She demonstrates limited AROM/ PROM secondary to pain in the hip. MMT was held today due to pt's irritability and severity. She currently is ambulating with a RW with an antalgic gait pattern with limited stride on the RLE, and stance on the LLE. TTP is located along the adductors, proximal hip flexors and piriformis. She would benefit from physical therapy to decrease L hip pain, increase ROM/ strength, promote gait efficiency and  overall function by addressing the deficits listed.   IMPAIRMENTS/ DEFICITS: Objective impairments include Abnormal gait, decreased activity tolerance, decreased balance, decreased endurance, difficulty walking, decreased strength, increased edema, increased muscle spasms,  postural dysfunction, and pain.  REHAB POTENTIAL: Good  CLINICAL DECISION MAKING: Stable/uncomplicated  EVALUATION COMPLEXITY: Low   GOALS:  SHORT TERM GOALS:  STG Name Target Date Goal status  1 Pt to be IND with initial HEP to progress PT  Baseline:  06/20/2021  Revised  2 Pt to be able to walk and stand for >/= 15 min for progression of endurance 06/20/2021   Revised  3 Increase L hip flexoin to >/= 90 degrees actively with </= 5/10 pain max for progression of hip mobility  06/20/2021  Revised   LONG TERM GOALS:   LTG Name Target Date Goal status  1 Increase L hip gross strength to >/= 4/5 to promote hip stability and maximize safety  Baseline: 07/18/2021  Revised  2 Increase L hip AROM WFL compared bil to assist with general mobility required for ADLS with </= max 2/10 pain Baseline: 07/18/2021 Revised  3 Pt to be able to stand/ walk for >/= 30 min with LRAD demonstrating heel strike/ toe off pattern with min deviation for functional endurance for in home amb and short community distances Baseline: 07/18/2021 Revised  4 Pt to increase FOTO score to >/= 70% to demo improvement in function Baseline: 07/18/2021 Revised  4 Pt to be able to perform 5 x sit to stand </= 15 seconds to demo improvement in function Baseline: at eval 18 seconds, at re-eval unable to perform 07/18/2021 Revised  5 Pt to be IND with all HEP to maintain and progress curreht LOF IND 07/18/2021 Revised   PLAN: PT FREQUENCY: 2x/week  PT DURATION: 8 weeks  PLANNED INTERVENTIONS: Therapeutic exercises, Therapeutic activity, Neuro Muscular re-education, Balance training, Gait training, Patient/Family education, Joint mobilization, Stair  training, DME instructions, Dry Needling, Electrical stimulation, Cryotherapy, Moist heat, Taping, Ionotophoresis 4mg /ml Dexamethasone, and Manual therapy  PLAN FOR NEXT SESSION: Review/ update HEP PRN. Hip ROM, gross strengthening, gait training, weight rocking.  Ebone Alcivar PT, DPT, LAT, ATC  05/23/21  2:10 PM

## 2021-05-25 ENCOUNTER — Ambulatory Visit: Payer: Medicare Other | Admitting: Physical Therapy

## 2021-05-25 ENCOUNTER — Encounter: Payer: Self-pay | Admitting: Physical Therapy

## 2021-05-25 ENCOUNTER — Other Ambulatory Visit: Payer: Self-pay

## 2021-05-25 DIAGNOSIS — M6281 Muscle weakness (generalized): Secondary | ICD-10-CM

## 2021-05-25 DIAGNOSIS — R2689 Other abnormalities of gait and mobility: Secondary | ICD-10-CM

## 2021-05-25 DIAGNOSIS — M25552 Pain in left hip: Secondary | ICD-10-CM | POA: Diagnosis not present

## 2021-05-25 NOTE — Therapy (Addendum)
OUTPATIENT PHYSICAL THERAPY RE-EVALUATION NOTE   Patient Name: Christine Roberson MRN: [id number] DOB:1965/05/22, 56 y.o., female Today's Date: 05/25/2021  PCP: Josephina Gip, NP REFERRING PROVIDER: Molly Maduro, MD   PT End of Session - 05/25/21 1426     Visit Number 3    Number of Visits 13    Date for PT Re-Evaluation 07/18/21    PT Start Time 1422   pt arrived late   PT Stop Time 1510    PT Time Calculation (min) 48 min    Activity Tolerance Patient tolerated treatment well    Behavior During Therapy WFL for tasks assessed/performed              Past Medical History:  Diagnosis Date   Arthritis    Chronic back pain    DDD (degenerative disc disease), lumbar    Headache    Hypertension    MVP (mitral valve prolapse)    Past Surgical History:  Procedure Laterality Date   ABDOMINAL HYSTERECTOMY     CLOSED MANIPULATION SHOULDER WITH STERIOD INJECTION Right 03/05/2017   Procedure: CLOSED MANIPULATION SHOULDER WITH STEROID INJECTION;  Surgeon: Sheral Apley, MD;  Location: [facility];  Service: Orthopedics;  Laterality: Right;   JOINT REPLACEMENT Right    TKR   KNEE ARTHROSCOPY Bilateral    SPINAL CORD STIMULATOR IMPLANT     SPINAL CORD STIMULATOR REMOVAL     TONSILLECTOMY     TOTAL HIP ARTHROPLASTY Right    There are no problems to display for this patient.   REFERRING DIAG: Unilateral primary osteoarthritis, left hip [M16.12]  THERAPY DIAG:  Pain in left hip  Muscle weakness (generalized)  Other abnormalities of gait and mobility  PERTINENT HISTORY: R THA , L THA 2/14  PRECAUTIONS: Posterior hip precautions  SUBJECTIVE: " I am still having pain in the hip, its really hard to move it out to the side."  PAIN:  Are you having pain? Yes NPRS scale: 7/10 Pain location: L hip  Pain orientation: Left  PAIN TYPE: aching Pain description: constant  Aggravating factors: standing/ walking Relieving factors: sitting/ laying down,  medication     OBJECTIVE:  **Measurements assessed at evaluation unless otherwise noted by date**  DIAGNOSTIC FINDINGS:  MRI 01/18/2020 IMPRESSION: No acute abnormality. Degenerative tear of the anterior, superior right labrum.  PATIENT SURVEYS:  FOTO 50%, predicted 70%  COGNITION:  Overall cognitive status: Within functional limits for tasks assessed     SENSATION:  Light touch: Appears intact  POSTURE:  Forward head position  PALPATION: TTP along the glute med/ and along the anterior aspect of the acetabulofemoral joint, along the proximal adductors, piriformis and proximal rectus femoris.   LE AROM/PROM:  A/PROM Right 04/18/2021 Left 04/18/2021 Left  05/18/2021  Hip flexion [facility] WFL * *60/110  Hip extension [facility] WFL *   Hip abduction [facility] WFL * *32 in supine  Hip adduction WFL WFL *  *WFL  Hip internal rotation     Hip external rotation     Knee flexion     Knee extension     Ankle dorsiflexion     Ankle plantarflexion     Ankle inversion     Ankle eversion        (Blank rows = not tested, * pain during assessment)  LE MMT:  MMT Right 04/18/2021 Left 04/18/2021 Left  05/18/2021  Hip flexion 4/5 4/-5* 3+/5  Hip extension 4-/5 3/5* 3+/5  Hip abduction 4-/5 3+/5* 3+/5  Hip  adduction 4-/5 4-/5* 4/5  Hip internal rotation     Hip external rotation     Knee flexion     Knee extension     Ankle dorsiflexion     Ankle plantarflexion     Ankle inversion     Ankle eversion      (Blank rows = not tested, *pain during testing )  LOWER EXTREMITY SPECIAL TESTS:  N/A  FUNCTIONAL TESTS:  5 times sit to stand: 18 seconds 05/23/2021 - unable to perform  GAIT: Assistive device utilized: RW Level of assistance: Complete Independence Comments: antalgic gait with decreaesd stance on the LLE, and stride on the RLE    TODAY'S TREATMENT:  [facility] Adult PT Treatment:                                                DATE: 05/25/2021 Therapeutic Exercise: L hip clamshell,  in R sidelying 2 x 10 L glute set 2 x 10 holding 5 seconds Bridge 2 x 10  Supine marching 2 x 10 Manual Therapy: MTPR along the glute med/ piriformis STW along the glute med/ min  Grade II LAD LLE to reduce pain Neuromuscular re-ed: Rocking forward/ backward 1 x 10 before changing lead leg for pre-gait training. Modalities Ice pack on Left hip in R sidelying x 10 min with pillow between the knees.    [facility] Adult PT Treatment:                                                DATE: 05/17/2021 Therapeutic Exercise: Modified thomas test pos L hip flexor stretch 2 x 30 seconds Adductor stretching in hooklying pos 2 x 30 sec Supine heel slide 1 x 10 Clamshell 2 x 10 Glute set with quad set 1 x 10 holding 10 seconds Supine marching 2 x 10 LTR 1 x 15 Manual STW along the distal adductor longus   [facility] Adult PT Treatment:                                                DATE: 04/18/2021 Therapeutic Exercise: Supine marching with band 1 x 10 LLE with GTB Clamshell 1 x 10 with GTB Bridge 1 x 10 SLR 1 x 10 on the LLE   PATIENT EDUCATION:  Education details: re-evaluation findings, POC, goals. reviewed HEP and updated today  Person educated: Patient Education method: Explanation Education comprehension: verbalized understanding   HOME EXERCISE PROGRAM: Access Code: [email or web address] URL: [email or web address] Date: 05/23/2021 Prepared by: Lulu Riding  Exercises Hooklying Clamshell with Resistance - 1 x daily - 7 x weekly - 2 sets - 10 reps Supine Heel Slides (Mirrored) - 1 x daily - 7 x weekly - 2 sets - 10 reps Supine March - 1 x daily - 7 x weekly - 10 reps - 2 sets - 5 hold Supine Quad Set - 1 x daily - 7 x weekly - 2 sets - 10 reps Modified Thomas Stretch (Mirrored) - 2 x daily - 7 x weekly - 2 sets - 2 reps - 30 seconds hold    ASSESSMENT:  CLINICAL IMPRESSION: Pt reports she has been consistent with her HEP but continues to report elevated pain. Focused on STW  along the glute med/ piriformis followed with STW to calm down soreness. She did well with exercises following manual reporting reduced soreness. She responded well to gentle LAD to help reduce pain, and was able to complete all exercises with and utilized ice end of session to calm down soreness.  IMPAIRMENTS/ DEFICITS: Objective impairments include Abnormal gait, decreased activity tolerance, decreased balance, decreased endurance, difficulty walking, decreased strength, increased edema, increased muscle spasms, postural dysfunction, and pain.  REHAB POTENTIAL: Good  CLINICAL DECISION MAKING: Stable/uncomplicated  EVALUATION COMPLEXITY: Low   GOALS:  SHORT TERM GOALS:  STG Name Target Date Goal status  1 Pt to be IND with initial HEP to progress PT  Baseline:  06/22/2021  Revised  2 Pt to be able to walk and stand for >/= 15 min for progression of endurance 06/22/2021   Revised  3 Increase L hip flexoin to >/= 90 degrees actively with </= 5/10 pain max for progression of hip mobility  06/22/2021  Revised   LONG TERM GOALS:   LTG Name Target Date Goal status  1 Increase L hip gross strength to >/= 4/5 to promote hip stability and maximize safety  Baseline: 07/20/2021  Revised  2 Increase L hip AROM WFL compared bil to assist with general mobility required for ADLS with </= max 2/10 pain Baseline: 07/18/2021 Revised  3 Pt to be able to stand/ walk for >/= 30 min with LRAD demonstrating heel strike/ toe off pattern with min deviation for functional endurance for in home amb and short community distances Baseline: 07/18/2021 Revised  4 Pt to increase FOTO score to >/= 70% to demo improvement in function Baseline: 07/18/2021 Revised  4 Pt to be able to perform 5 x sit to stand </= 15 seconds to demo improvement in function Baseline: at eval 18 seconds, at re-eval unable to perform 07/18/2021 Revised  5 Pt to be IND with all HEP to maintain and progress curreht LOF IND 07/18/2021 Revised    PLAN: PT FREQUENCY: 2x/week  PT DURATION: 8 weeks  PLANNED INTERVENTIONS: Therapeutic exercises, Therapeutic activity, Neuro Muscular re-education, Balance training, Gait training, Patient/Family education, Joint mobilization, Stair training, DME instructions, Dry Needling, Electrical stimulation, Cryotherapy, Moist heat, Taping, Ionotophoresis 4mg /ml Dexamethasone, and Manual therapy  PLAN FOR NEXT SESSION: Review/ update HEP PRN. Posterior hip precautions, Hip ROM, gross strengthening, gait training, weight rocking.   Piercen Covino PT, DPT, LAT, ATC  05/30/21  7:32 AM

## 2021-05-30 ENCOUNTER — Encounter: Payer: Self-pay | Admitting: Physical Therapy

## 2021-05-30 ENCOUNTER — Other Ambulatory Visit: Payer: Self-pay

## 2021-05-30 ENCOUNTER — Ambulatory Visit: Payer: Medicare Other | Admitting: Physical Therapy

## 2021-05-30 DIAGNOSIS — M25552 Pain in left hip: Secondary | ICD-10-CM | POA: Diagnosis not present

## 2021-05-30 DIAGNOSIS — M6281 Muscle weakness (generalized): Secondary | ICD-10-CM

## 2021-05-30 DIAGNOSIS — R2689 Other abnormalities of gait and mobility: Secondary | ICD-10-CM

## 2021-05-30 NOTE — Therapy (Signed)
OUTPATIENT PHYSICAL THERAPY RE-EVALUATION NOTE   Patient Name: Christine Roberson MRN: [id number] DOB:12-04-1965, 56 y.o., female Today's Date: 05/30/2021  PCP: Christine Snook, NP REFERRING PROVIDER: Daleen Snook, NP      Past Medical History:  Diagnosis Date   Arthritis    Chronic back pain    DDD (degenerative disc disease), lumbar    Headache    Hypertension    MVP (mitral valve prolapse)    Past Surgical History:  Procedure Laterality Date   ABDOMINAL HYSTERECTOMY     CLOSED MANIPULATION SHOULDER WITH STERIOD INJECTION Right 03/05/2017   Procedure: CLOSED MANIPULATION SHOULDER WITH STEROID INJECTION;  Surgeon: Christine Butters, MD;  Location: [place];  Service: Orthopedics;  Laterality: Right;   JOINT REPLACEMENT Right    TKR   KNEE ARTHROSCOPY Bilateral    SPINAL CORD STIMULATOR IMPLANT     SPINAL CORD STIMULATOR REMOVAL     TONSILLECTOMY     TOTAL HIP ARTHROPLASTY Right    There are no problems to display for this patient.   REFERRING DIAG: Unilateral primary osteoarthritis, left hip [M16.12]  THERAPY DIAG:  No diagnosis found.  PERTINENT HISTORY: R THA , L THA 2/14  PRECAUTIONS: Posterior hip precautions      SUBJECTIVE: "My hip pain is just annoying."  " I am still having pain in the hip, its really hard to move it out to the side."  PAIN:  Are you having pain? Yes NPRS scale: 6/10 Pain location: L hip  Pain orientation: Left  PAIN TYPE: aching Pain description: constant  Aggravating factors: standing/ walking Relieving factors: sitting/ laying down, medication     OBJECTIVE:  **Measurements assessed at evaluation unless otherwise noted by date**  DIAGNOSTIC FINDINGS:  MRI 01/18/2020 IMPRESSION: No acute abnormality. Degenerative tear of the anterior, superior right labrum.  PATIENT SURVEYS:  FOTO 50%, predicted 70%  COGNITION:  Overall cognitive status: Within functional limits for tasks  assessed     SENSATION:  Light touch: Appears intact  POSTURE:  Forward head position  PALPATION: TTP along the glute med/ and along the anterior aspect of the acetabulofemoral joint, along the proximal adductors, piriformis and proximal rectus femoris.   LE AROM/PROM:  A/PROM Right 04/18/2021 Left 04/18/2021 Left  05/18/2021  Hip flexion [facility] WFL * *60/110  Hip extension [facility] WFL *   Hip abduction [facility] WFL * *32 in supine  Hip adduction WFL WFL *  *WFL  Hip internal rotation     Hip external rotation     Knee flexion     Knee extension     Ankle dorsiflexion     Ankle plantarflexion     Ankle inversion     Ankle eversion        (Blank rows = not tested, * pain during assessment)  LE MMT:  MMT Right 04/18/2021 Left 04/18/2021 Left  05/18/2021  Hip flexion 4/5 4/-5* 3+/5  Hip extension 4-/5 3/5* 3+/5  Hip abduction 4-/5 3+/5* 3+/5  Hip adduction 4-/5 4-/5* 4/5  Hip internal rotation     Hip external rotation     Knee flexion     Knee extension     Ankle dorsiflexion     Ankle plantarflexion     Ankle inversion     Ankle eversion      (Blank rows = not tested, *pain during testing )  LOWER EXTREMITY SPECIAL TESTS:  N/A  FUNCTIONAL TESTS:  5 times sit to stand: 18 seconds 05/23/2021 - unable  to perform  GAIT: Assistive device utilized: RW Level of assistance: Complete Independence Comments: antalgic gait with decreaesd stance on the LLE, and stride on the RLE    TODAY'S TREATMENT:  [facility] Adult PT Treatment:                                                DATE: 05/30/21 Therapeutic Exercise: Glute sets 10 x 10 sec holds R side lying clamshells 2 x10 Bridges 2 x 12 Supine passive hip ADD stretch 2x30 sec Hip flexor stretch in supine 2x30 sec Discussed using a tennis ball to release trigger points in piriformis at home and HEP. Manual Therapy: MTPR to left glute med/piriformis Therapeutic Activity: Sit to stands from table elevated so hip is <90 degrees FL  2x10 with SBA and without AD Lateral weight shifting over to left LE in standing x 1 min Modalities: Iced left hip in right sidelying x10 min, no adverse reaction noted Self Care: Discussed hip precautions for posterior approach of surgery.   [facility] Adult PT Treatment:                                                DATE: 05/25/2021 Therapeutic Exercise: L hip clamshell, in R sidelying 2 x 10 L glute set 2 x 10 holding 5 seconds Bridge 2 x 10  Supine marching 2 x 10 Manual Therapy: MTPR along the glute med/ piriformis STW along the glute med/ min  Grade II LAD LLE to reduce pain Neuromuscular re-ed: Rocking forward/ backward 1 x 10 before changing lead leg for pre-gait training. Modalities Ice pack on Left hip in R sidelying x 10 min with pillow between the knees.    [facility] Adult PT Treatment:                                                DATE: 05/17/2021 Therapeutic Exercise: Modified thomas test pos L hip flexor stretch 2 x 30 seconds Adductor stretching in hooklying pos 2 x 30 sec Supine heel slide 1 x 10 Clamshell 2 x 10 Glute set with quad set 1 x 10 holding 10 seconds Supine marching 2 x 10 LTR 1 x 15 Manual STW along the distal adductor longus      PATIENT EDUCATION:  Education details: re-evaluation findings, POC, goals. reviewed HEP and updated today  Person educated: Patient Education method: Explanation Education comprehension: verbalized understanding   HOME EXERCISE PROGRAM: Access Code: [email or web address] URL: [email or web address] Date: 05/30/2021 Prepared by: Christine Roberson  Exercises Hooklying Clamshell with Resistance - 1 x daily - 7 x weekly - 2 sets - 10 reps Supine Heel Slides (Mirrored) - 1 x daily - 7 x weekly - 2 sets - 10 reps Supine March - 1 x daily - 7 x weekly - 2 sets - 10 reps - 5 hold Supine Quad Set - 1 x daily - 7 x weekly - 2 sets - 10 reps Modified Thomas Stretch (Mirrored) - 2 x daily - 7 x weekly - 2 sets - 2 reps - 30  seconds hold Lateral Weight Shift with Parallel  Bars (BKA) - 1 x daily - 7 x weekly - 1 sets - 1 min hold     ASSESSMENT:  CLINICAL IMPRESSION: Christine Roberson arrived to PT with consistent 6/10 posterior left hip pain and her left piriformis and glute med were tender upon manual therapy. She tolerated manual trigger point release in today's session to her left piriformis/glute med. She tolerated the addition of sit to stands from an elevated surface and lateral weight shifts onto her left leg without an increase in pain.   IMPAIRMENTS/ DEFICITS: Objective impairments include Abnormal gait, decreased activity tolerance, decreased balance, decreased endurance, difficulty walking, decreased strength, increased edema, increased muscle spasms, postural dysfunction, and pain.  REHAB POTENTIAL: Good  CLINICAL DECISION MAKING: Stable/uncomplicated  EVALUATION COMPLEXITY: Low   GOALS:  SHORT TERM GOALS:  STG Name Target Date Goal status  1 Pt to be IND with initial HEP to progress PT  Baseline:  06/27/2021  Revised  2 Pt to be able to walk and stand for >/= 15 min for progression of endurance 06/27/2021   Revised  3 Increase L hip flexoin to >/= 90 degrees actively with </= 5/10 pain max for progression of hip mobility  06/27/2021  Revised   LONG TERM GOALS:   LTG Name Target Date Goal status  1 Increase L hip gross strength to >/= 4/5 to promote hip stability and maximize safety  Baseline: 07/25/2021  Revised  2 Increase L hip AROM WFL compared bil to assist with general mobility required for ADLS with </= max [id number] pain Baseline: 07/18/2021 Revised  3 Pt to be able to stand/ walk for >/= 30 min with LRAD demonstrating heel strike/ toe off pattern with min deviation for functional endurance for in home amb and short community distances Baseline: 07/18/2021 Revised  4 Pt to increase FOTO score to >/= 70% to demo improvement in function Baseline: 07/18/2021 Revised  4 Pt to be able to perform 5 x  sit to stand </= 15 seconds to demo improvement in function Baseline: at eval 18 seconds, at re-eval unable to perform 07/18/2021 Revised  5 Pt to be IND with all HEP to maintain and progress curreht LOF IND 07/18/2021 Revised   PLAN: PT FREQUENCY: 2x/week  PT DURATION: 8 weeks  PLANNED INTERVENTIONS: Therapeutic exercises, Therapeutic activity, Neuro Muscular re-education, Balance training, Gait training, Patient/Family education, Joint mobilization, Stair training, DME instructions, Dry Needling, Electrical stimulation, Cryotherapy, Moist heat, Taping, Ionotophoresis 4mg /ml Dexamethasone, and Manual therapy  PLAN FOR NEXT SESSION: Review/ update HEP PRN. Posterior hip precautions, Hip ROM, gross strengthening, gait training, weight rocking. Stair negotiation.    Edythe Lynn, PT, DPT 05/30/21 1:12 PM

## 2021-05-31 NOTE — Therapy (Signed)
OUTPATIENT PHYSICAL THERAPY RE-EVALUATION NOTE   Patient Name: Christine Roberson MRN: [id number] DOB:1965/07/18, 56 y.o., female Today's Date: 06/01/2021  PCP: Josephina Gip, NP REFERRING PROVIDER: Molly Maduro, MD   PT End of Session - 06/01/21 1326     Visit Number 5    Number of Visits 13    Date for PT Re-Evaluation 07/18/21    PT Start Time 1330    PT Stop Time 1420    PT Time Calculation (min) 50 min    Activity Tolerance Patient tolerated treatment well    Behavior During Therapy WFL for tasks assessed/performed               Past Medical History:  Diagnosis Date   Arthritis    Chronic back pain    DDD (degenerative disc disease), lumbar    Headache    Hypertension    MVP (mitral valve prolapse)    Past Surgical History:  Procedure Laterality Date   ABDOMINAL HYSTERECTOMY     CLOSED MANIPULATION SHOULDER WITH STERIOD INJECTION Right 03/05/2017   Procedure: CLOSED MANIPULATION SHOULDER WITH STEROID INJECTION;  Surgeon: Christine Apley, MD;  Location: [facility];  Service: Orthopedics;  Laterality: Right;   JOINT REPLACEMENT Right    TKR   KNEE ARTHROSCOPY Bilateral    SPINAL CORD STIMULATOR IMPLANT     SPINAL CORD STIMULATOR REMOVAL     TONSILLECTOMY     TOTAL HIP ARTHROPLASTY Right    There are no problems to display for this patient.   REFERRING DIAG: Unilateral primary osteoarthritis, left hip [M16.12]  THERAPY DIAG:  Pain in left hip  Muscle weakness (generalized)  Other abnormalities of gait and mobility  PERTINENT HISTORY: R THA , L THA 2/14  PRECAUTIONS: Posterior hip precautions    SUBJECTIVE: "My left hip was hurting after the last therapy session. Doctor gave me a muscle relaxer recently, but I have not been taking it. I have been using the tennis ball at home to help with the knots in my butt and it's been helping some."  PAIN:  Are you having pain? Yes VAS scale: 6/10 Pain location: posterior left hip Pain  orientation: Left  Pain description: constant  Aggravating factors: standing/walking Relieving factors: laying down        OBJECTIVE:  **Measurements assessed at evaluation unless otherwise noted by date**  DIAGNOSTIC FINDINGS:  MRI 01/18/2020 IMPRESSION: No acute abnormality. Degenerative tear of the anterior, superior right labrum.  PATIENT SURVEYS:  FOTO 50%, predicted 70%  COGNITION:  Overall cognitive status: Within functional limits for tasks assessed     SENSATION:  Light touch: Appears intact  POSTURE:  Forward head position  PALPATION: TTP along the glute med/ and along the anterior aspect of the acetabulofemoral joint, along the proximal adductors, piriformis and proximal rectus femoris.   LE AROM/PROM:  A/PROM Right 04/18/2021 Left 04/18/2021 Left  05/18/2021  Hip flexion [facility] WFL * *60/110  Hip extension [facility] WFL *   Hip abduction [facility] WFL * *32 in supine  Hip adduction WFL WFL *  *WFL  Hip internal rotation     Hip external rotation     Knee flexion     Knee extension     Ankle dorsiflexion     Ankle plantarflexion     Ankle inversion     Ankle eversion        (Blank rows = not tested, * pain during assessment)  LE MMT:  MMT Right 04/18/2021 Left 04/18/2021  Left  05/18/2021  Hip flexion 4/5 4/-5* 3+/5  Hip extension 4-/5 3/5* 3+/5  Hip abduction 4-/5 3+/5* 3+/5  Hip adduction 4-/5 4-/5* 4/5  Hip internal rotation     Hip external rotation     Knee flexion     Knee extension     Ankle dorsiflexion     Ankle plantarflexion     Ankle inversion     Ankle eversion      (Blank rows = not tested, *pain during testing )  LOWER EXTREMITY SPECIAL TESTS:  N/A  FUNCTIONAL TESTS:  5 times sit to stand: 18 seconds 05/23/2021 - unable to perform  GAIT: Assistive device utilized: RW Level of assistance: Complete Independence Comments: antalgic gait with decreaesd stance on the LLE, and stride on the RLE    TODAY'S TREATMENT:  [facility] Adult PT  Treatment:                                                DATE: 06/01/2021 Therapeutic Exercise: Glute sets 15 x 5 sec holds R sidelying clamshells 2x12 Standing left hip ABD 2 x10 Bridges 2x15 Manual Therapy: STW to glute med/piriformis  Therapeutic Activity: FWD step ups 4 inch step L LE leading with SBA 2 x10 Lateral steps ups 4 inch step L LE with SBA 1 x10 with 1 UE support on free motion Sit to stands from elevated table so hip is <90 degrees FL 1 x 15 with SBA and without AD Modalities: Ice left hip x10 min  [facility] Adult PT Treatment:                                                DATE: 05/30/21 Therapeutic Exercise: Glute sets 10 x 10 sec holds R side lying clamshells 2 x10 Bridges 2 x 12 Supine passive hip ADD stretch 2x30 sec Hip flexor stretch in supine 2x30 sec Discussed using a tennis ball to release trigger points in piriformis at home and HEP. Manual Therapy: MTPR to left glute med/piriformis Therapeutic Activity: Sit to stands from table elevated so hip is <90 degrees FL 2x10 with SBA and without AD Lateral weight shifting over to left LE in standing x 1 min Modalities: Iced left hip in right sidelying x10 min, no adverse reaction noted Self Care: Discussed hip precautions for posterior approach of surgery.   [facility] Adult PT Treatment:                                                DATE: 05/25/2021 Therapeutic Exercise: L hip clamshell, in R sidelying 2 x 10 L glute set 2 x 10 holding 5 seconds Bridge 2 x 10  Supine marching 2 x 10 Manual Therapy: MTPR along the glute med/ piriformis STW along the glute med/ min  Grade II LAD LLE to reduce pain Neuromuscular re-ed: Rocking forward/ backward 1 x 10 before changing lead leg for pre-gait training. Modalities Ice pack on Left hip in R sidelying x 10 min with pillow between the knees.       PATIENT EDUCATION:  Education details: re-evaluation findings,  POC, goals. reviewed HEP and updated today  Person  educated: Patient Education method: Explanation Education comprehension: verbalized understanding   HOME EXERCISE PROGRAM: Access Code: [email or web address] URL: [email or web address] Date: 06/01/2021 Prepared by: Johny Shears  Exercises Hooklying Clamshell with Resistance - 1 x daily - 7 x weekly - 2 sets - 10 reps Supine Heel Slides (Mirrored) - 1 x daily - 7 x weekly - 2 sets - 10 reps Supine March - 1 x daily - 7 x weekly - 2 sets - 10 reps - 5 hold Supine Quad Set - 1 x daily - 7 x weekly - 2 sets - 10 reps Modified Thomas Stretch (Mirrored) - 2 x daily - 7 x weekly - 2 sets - 2 reps - 30 seconds hold Lateral Weight Shift with Parallel Bars (BKA) - 1 x daily - 7 x weekly - 1 sets - 1 min hold Standing Hip Abduction with Counter Support - 1 x daily - 7 x weekly - 1 sets - 10 reps      ASSESSMENT:  CLINICAL IMPRESSION: Neveah arrived to PT with 6/10 left hip pain. She demonstrated palpable tightness in her left glute med and piriformis during soft tissue work today. She tolerated the addition of standing exercises in today's session. She became more fatigued performing lateral step ups on the 4 inch step.    IMPAIRMENTS/ DEFICITS: Objective impairments include Abnormal gait, decreased activity tolerance, decreased balance, decreased endurance, difficulty walking, decreased strength, increased edema, increased muscle spasms, postural dysfunction, and pain.  REHAB POTENTIAL: Good  CLINICAL DECISION MAKING: Stable/uncomplicated  EVALUATION COMPLEXITY: Low   GOALS:  SHORT TERM GOALS:  STG Name Target Date Goal status  1 Pt to be IND with initial HEP to progress PT  Baseline:  06/29/2021  Revised  2 Pt to be able to walk and stand for >/= 15 min for progression of endurance 06/29/2021   Revised  3 Increase L hip flexoin to >/= 90 degrees actively with </= 5/10 pain max for progression of hip mobility  06/29/2021  Revised   LONG TERM GOALS:   LTG Name Target Date  Goal status  1 Increase L hip gross strength to >/= 4/5 to promote hip stability and maximize safety  Baseline: 07/27/2021  Revised  2 Increase L hip AROM WFL compared bil to assist with general mobility required for ADLS with </= max 2/10 pain Baseline: 07/18/2021 Revised  3 Pt to be able to stand/ walk for >/= 30 min with LRAD demonstrating heel strike/ toe off pattern with min deviation for functional endurance for in home amb and short community distances Baseline: 07/18/2021 Revised  4 Pt to increase FOTO score to >/= 70% to demo improvement in function Baseline: 07/18/2021 Revised  4 Pt to be able to perform 5 x sit to stand </= 15 seconds to demo improvement in function Baseline: at eval 18 seconds, at re-eval unable to perform 07/18/2021 Revised  5 Pt to be IND with all HEP to maintain and progress curreht LOF IND 07/18/2021 Revised   PLAN: PT FREQUENCY: 2x/week  PT DURATION: 8 weeks  PLANNED INTERVENTIONS: Therapeutic exercises, Therapeutic activity, Neuro Muscular re-education, Balance training, Gait training, Patient/Family education, Joint mobilization, Stair training, DME instructions, Dry Needling, Electrical stimulation, Cryotherapy, Moist heat, Taping, Ionotophoresis 4mg /ml Dexamethasone, and Manual therapy  PLAN FOR NEXT SESSION: Review/ update HEP PRN. Posterior hip precautions, Hip ROM, gross strengthening, gait training, weight rocking. Stair negotiation.    , PT, DPT 06/01/21 3:17 PM

## 2021-06-01 ENCOUNTER — Other Ambulatory Visit: Payer: Self-pay

## 2021-06-01 ENCOUNTER — Encounter: Payer: Self-pay | Admitting: Physical Therapy

## 2021-06-01 ENCOUNTER — Ambulatory Visit: Payer: Medicare Other | Attending: Adult Reconstructive Orthopaedic Surgery | Admitting: Physical Therapy

## 2021-06-01 DIAGNOSIS — M6281 Muscle weakness (generalized): Secondary | ICD-10-CM | POA: Diagnosis present

## 2021-06-01 DIAGNOSIS — M25552 Pain in left hip: Secondary | ICD-10-CM | POA: Insufficient documentation

## 2021-06-01 DIAGNOSIS — R2689 Other abnormalities of gait and mobility: Secondary | ICD-10-CM | POA: Diagnosis present

## 2021-06-06 ENCOUNTER — Other Ambulatory Visit: Payer: Self-pay

## 2021-06-06 ENCOUNTER — Ambulatory Visit: Payer: Medicare Other | Admitting: Physical Therapy

## 2021-06-06 DIAGNOSIS — M6281 Muscle weakness (generalized): Secondary | ICD-10-CM

## 2021-06-06 DIAGNOSIS — M25552 Pain in left hip: Secondary | ICD-10-CM

## 2021-06-06 DIAGNOSIS — R2689 Other abnormalities of gait and mobility: Secondary | ICD-10-CM

## 2021-06-06 NOTE — Therapy (Signed)
OUTPATIENT PHYSICAL THERAPY RE-EVALUATION NOTE   Patient Name: Tauriel Scronce MRN: [id number] DOB:September 19, 1965, 56 y.o., female Today's Date: 06/06/2021  PCP: Josephina Gip, NP REFERRING PROVIDER: Josephina Gip, NP   PT End of Session - 06/06/21 1327     Visit Number 6    Number of Visits 13    Date for PT Re-Evaluation 07/18/21    PT Start Time 1327    PT Stop Time 1416    PT Time Calculation (min) 49 min    Activity Tolerance Patient tolerated treatment well    Behavior During Therapy WFL for tasks assessed/performed                Past Medical History:  Diagnosis Date   Arthritis    Chronic back pain    DDD (degenerative disc disease), lumbar    Headache    Hypertension    MVP (mitral valve prolapse)    Past Surgical History:  Procedure Laterality Date   ABDOMINAL HYSTERECTOMY     CLOSED MANIPULATION SHOULDER WITH STERIOD INJECTION Right 03/05/2017   Procedure: CLOSED MANIPULATION SHOULDER WITH STEROID INJECTION;  Surgeon: Sheral Apley, MD;  Location: [facility];  Service: Orthopedics;  Laterality: Right;   JOINT REPLACEMENT Right    TKR   KNEE ARTHROSCOPY Bilateral    SPINAL CORD STIMULATOR IMPLANT     SPINAL CORD STIMULATOR REMOVAL     TONSILLECTOMY     TOTAL HIP ARTHROPLASTY Right    There are no problems to display for this patient.   REFERRING DIAG: Unilateral primary osteoarthritis, left hip [M16.12]  THERAPY DIAG:  Pain in left hip  Muscle weakness (generalized)  Other abnormalities of gait and mobility  PERTINENT HISTORY: R THA , L THA 2/14  PRECAUTIONS: Posterior hip precautions    SUBJECTIVE: "I am still feeling pretty sore in the back and side of my L hip."  PAIN:  Are you having pain? Yes VAS scale: 5/10 Pain location: posterior left hip Pain orientation: Left  Pain description: constant  Aggravating factors: standing/walking Relieving factors: laying down        OBJECTIVE:  **Measurements assessed  at evaluation unless otherwise noted by date**  DIAGNOSTIC FINDINGS:  MRI 01/18/2020 IMPRESSION: No acute abnormality. Degenerative tear of the anterior, superior right labrum.  PATIENT SURVEYS:  FOTO 50%, predicted 70%   06/06/2021 41% COGNITION:  Overall cognitive status: Within functional limits for tasks assessed     SENSATION:  Light touch: Appears intact  POSTURE:  Forward head position  PALPATION: TTP along the glute med/ and along the anterior aspect of the acetabulofemoral joint, along the proximal adductors, piriformis and proximal rectus femoris.   LE AROM/PROM:  A/PROM Right 04/18/2021 Left 04/18/2021 Left  05/18/2021  Hip flexion [facility] WFL * *60/110  Hip extension [facility] WFL *   Hip abduction [facility] WFL * *32 in supine  Hip adduction WFL WFL *  *WFL  Hip internal rotation     Hip external rotation     Knee flexion     Knee extension     Ankle dorsiflexion     Ankle plantarflexion     Ankle inversion     Ankle eversion        (Blank rows = not tested, * pain during assessment)  LE MMT:  MMT Right 04/18/2021 Left 04/18/2021 Left  05/18/2021  Hip flexion 4/5 4/-5* 3+/5  Hip extension 4-/5 3/5* 3+/5  Hip abduction 4-/5 3+/5* 3+/5  Hip adduction 4-/5 4-/5* 4/5  Hip internal rotation     Hip external rotation     Knee flexion     Knee extension     Ankle dorsiflexion     Ankle plantarflexion     Ankle inversion     Ankle eversion      (Blank rows = not tested, *pain during testing )  LOWER EXTREMITY SPECIAL TESTS:  N/A  FUNCTIONAL TESTS:  5 times sit to stand: 18 seconds 05/23/2021 - unable to perform  GAIT: Assistive device utilized: RW Level of assistance: Complete Independence Comments: antalgic gait with decreaesd stance on the LLE, and stride on the RLE    TODAY'S TREATMENT:  [facility] Adult PT Treatment:                                                DATE: 06/06/2021 Therapeutic Exercise: Sidelying L clamhsell 2 x 15 (with pillow between the knees  to prevent from violating precautions) Nu-step L5 x 5 min LE only SLR LLE 2 x 10 performing 6 inches Hamstring stretch 2 x 30 sec. LLE LAQ 2 x 12 with 3# Manual Therapy: MTPR along the glute med/ min  Grade I-II LAD LLE  L ankle pumps for scaitic nerve glides during hamstring stretch  Modalities: L hip MHP x 10 min in  R sidelying with pillow between the knees.  Self Care: Reviewed hip precautions for the L hip  [facility] Adult PT Treatment:                                                DATE: 06/01/2021 Therapeutic Exercise: Glute sets 15 x 5 sec holds R sidelying clamshells 2x12 Standing left hip ABD 2 x10 Bridges 2x15 Manual Therapy: STW to glute med/piriformis  Therapeutic Activity: FWD step ups 4 inch step L LE leading with SBA 2 x10 Lateral steps ups 4 inch step L LE with SBA 1 x10 with 1 UE support on free motion Sit to stands from elevated table so hip is <90 degrees FL 1 x 15 with SBA and without AD Modalities: Ice left hip x10 min  [facility] Adult PT Treatment:                                                DATE: 05/30/21 Therapeutic Exercise: Glute sets 10 x 10 sec holds R side lying clamshells 2 x10 Bridges 2 x 12 Supine passive hip ADD stretch 2x30 sec Hip flexor stretch in supine 2x30 sec Discussed using a tennis ball to release trigger points in piriformis at home and HEP. Manual Therapy: MTPR to left glute med/piriformis Therapeutic Activity: Sit to stands from table elevated so hip is <90 degrees FL 2x10 with SBA and without AD Lateral weight shifting over to left LE in standing x 1 min Modalities: Iced left hip in right sidelying x10 min, no adverse reaction noted Self Care: Discussed hip precautions for posterior approach of surgery.   [facility] Adult PT Treatment:  DATE: 05/25/2021 Therapeutic Exercise: L hip clamshell, in R sidelying 2 x 10 L glute set 2 x 10 holding 5 seconds Bridge 2 x 10  Supine marching 2 x  10  Manual Therapy: MTPR along the glute med/ piriformis STW along the glute med/ min  Grade II LAD LLE to reduce pain Neuromuscular re-ed: Rocking forward/ backward 1 x 10 before changing lead leg for pre-gait training. Modalities Ice pack on Left hip in R sidelying x 10 min with pillow between the knees.       PATIENT EDUCATION:  Education details: re-evaluation findings, POC, goals. reviewed HEP and updated today  Person educated: Patient Education method: Explanation Education comprehension: verbalized understanding   HOME EXERCISE PROGRAM: Access Code: [email or web address] URL: [email or web address] Date: 06/06/2021 Prepared by: Lulu Riding  Exercises Hooklying Clamshell with Resistance - 1 x daily - 7 x weekly - 2 sets - 10 reps Supine Heel Slides (Mirrored) - 1 x daily - 7 x weekly - 2 sets - 10 reps Supine March - 1 x daily - 7 x weekly - 2 sets - 10 reps - 5 hold Supine Quad Set - 1 x daily - 7 x weekly - 2 sets - 10 reps Modified Thomas Stretch (Mirrored) - 2 x daily - 7 x weekly - 2 sets - 2 reps - 30 seconds hold Lateral Weight Shift with Parallel Bars (BKA) - 1 x daily - 7 x weekly - 1 sets - 1 min hold Standing Hip Abduction with Counter Support - 1 x daily - 7 x weekly - 1 sets - 10 reps SLR - 1 x daily - 7 x weekly - 3 sets - 10 reps - 1 hold       ASSESSMENT:  CLINICAL IMPRESSION: Mieke reports consistency with her HEP but has noted increased soreness over the last few days located in the posterolateral aspect of the hip. Continued STW along the glute med/ min and max, followed with gentle distraction to help reduce pain/ tension. Continued working on gross hip strengthening which she did well with. Trialed MHP end of session to calm down muscle tension / pain in R sidelying with pillow between knees.   IMPAIRMENTS/ DEFICITS: Objective impairments include Abnormal gait, decreased activity tolerance, decreased balance, decreased endurance,  difficulty walking, decreased strength, increased edema, increased muscle spasms, postural dysfunction, and pain.  REHAB POTENTIAL: Good  CLINICAL DECISION MAKING: Stable/uncomplicated  EVALUATION COMPLEXITY: Low   GOALS:  SHORT TERM GOALS:  STG Name Target Date Goal status  1 Pt to be IND with initial HEP to progress PT  Baseline:  07/04/2021  Revised  2 Pt to be able to walk and stand for >/= 15 min for progression of endurance 07/04/2021   Revised  3 Increase L hip flexoin to >/= 90 degrees actively with </= 5/10 pain max for progression of hip mobility  07/04/2021  Revised   LONG TERM GOALS:   LTG Name Target Date Goal status  1 Increase L hip gross strength to >/= 4/5 to promote hip stability and maximize safety  Baseline: 08/01/2021  Revised  2 Increase L hip AROM WFL compared bil to assist with general mobility required for ADLS with </= max 2/10 pain Baseline: 07/18/2021 Revised  3 Pt to be able to stand/ walk for >/= 30 min with LRAD demonstrating heel strike/ toe off pattern with min deviation for functional endurance for in home amb and short community distances Baseline: 07/18/2021 Revised  4 Pt to increase FOTO score  to >/= 70% to demo improvement in function Baseline: 07/18/2021 Revised  4 Pt to be able to perform 5 x sit to stand </= 15 seconds to demo improvement in function Baseline: at eval 18 seconds, at re-eval unable to perform 07/18/2021 Revised  5 Pt to be IND with all HEP to maintain and progress curreht LOF IND 07/18/2021 Revised   PLAN: PT FREQUENCY: 2x/week  PT DURATION: 8 weeks  PLANNED INTERVENTIONS: Therapeutic exercises, Therapeutic activity, Neuro Muscular re-education, Balance training, Gait training, Patient/Family education, Joint mobilization, Stair training, DME instructions, Dry Needling, Electrical stimulation, Cryotherapy, Moist heat, Taping, Ionotophoresis 4mg /ml Dexamethasone, and Manual therapy  PLAN FOR NEXT SESSION: Review/ update HEP PRN.  Posterior hip precautions, Hip ROM, gross strengthening, gait training, weight rocking. Stair negotiation.  [facility]    Carollynn Pennywell PT, DPT, LAT, ATC  06/06/21  2:09 PM

## 2021-06-08 ENCOUNTER — Encounter: Payer: Self-pay | Admitting: Physical Therapy

## 2021-06-08 ENCOUNTER — Ambulatory Visit: Payer: Medicare Other | Admitting: Physical Therapy

## 2021-06-08 ENCOUNTER — Other Ambulatory Visit: Payer: Self-pay

## 2021-06-08 DIAGNOSIS — M25552 Pain in left hip: Secondary | ICD-10-CM | POA: Diagnosis not present

## 2021-06-08 DIAGNOSIS — M6281 Muscle weakness (generalized): Secondary | ICD-10-CM

## 2021-06-08 DIAGNOSIS — R2689 Other abnormalities of gait and mobility: Secondary | ICD-10-CM

## 2021-06-08 NOTE — Therapy (Signed)
OUTPATIENT PHYSICAL THERAPY RE-EVALUATION NOTE   Patient Name: Christine Roberson MRN: [id number] DOB:1966/02/04, 56 y.o., female Today's Date: 06/08/2021  PCP: Josephina Gip, NP REFERRING PROVIDER: Molly Maduro, MD   PT End of Session - 06/08/21 1350     Visit Number 7    Number of Visits 13    Date for PT Re-Evaluation 07/18/21    PT Start Time 1350    PT Stop Time 1448   10 minutes heat   PT Time Calculation (min) 58 min    Activity Tolerance Patient tolerated treatment well    Behavior During Therapy WFL for tasks assessed/performed                 Past Medical History:  Diagnosis Date   Arthritis    Chronic back pain    DDD (degenerative disc disease), lumbar    Headache    Hypertension    MVP (mitral valve prolapse)    Past Surgical History:  Procedure Laterality Date   ABDOMINAL HYSTERECTOMY     CLOSED MANIPULATION SHOULDER WITH STERIOD INJECTION Right 03/05/2017   Procedure: CLOSED MANIPULATION SHOULDER WITH STEROID INJECTION;  Surgeon: Sheral Apley, MD;  Location: [facility];  Service: Orthopedics;  Laterality: Right;   JOINT REPLACEMENT Right    TKR   KNEE ARTHROSCOPY Bilateral    SPINAL CORD STIMULATOR IMPLANT     SPINAL CORD STIMULATOR REMOVAL     TONSILLECTOMY     TOTAL HIP ARTHROPLASTY Right    There are no problems to display for this patient.   REFERRING DIAG: Unilateral primary osteoarthritis, left hip [M16.12]  THERAPY DIAG:  Pain in left hip  Muscle weakness (generalized)  Other abnormalities of gait and mobility  PERTINENT HISTORY: R THA , L THA 2/14  PRECAUTIONS: Posterior hip precautions    SUBJECTIVE: "I woke up in really bad pain in my hip this morning, and took muscle relaxer and pain killer, and my hip feels better. Patient liked using the heat last time."   PAIN:  Are you having pain? No VAS scale: 2/10 Woke up this morning with 9/10 hip pain Pain location: incision Pain orientation: Left  Pain  description: constant  Aggravating factors: standing/walking Relieving factors: laying down, meds       OBJECTIVE:  **Measurements assessed at evaluation unless otherwise noted by date**  DIAGNOSTIC FINDINGS:  MRI 01/18/2020 IMPRESSION: No acute abnormality. Degenerative tear of the anterior, superior right labrum.  PATIENT SURVEYS:  FOTO 50%, predicted 70%   06/06/2021 41% COGNITION:  Overall cognitive status: Within functional limits for tasks assessed     SENSATION:  Light touch: Appears intact  POSTURE:  Forward head position  PALPATION: TTP along the glute med/ and along the anterior aspect of the acetabulofemoral joint, along the proximal adductors, piriformis and proximal rectus femoris.   LE AROM/PROM:  A/PROM Right 04/18/2021 Left 04/18/2021 Left  05/18/2021  Hip flexion [facility] WFL * *60/110  Hip extension [facility] WFL *   Hip abduction [facility] WFL * *32 in supine  Hip adduction WFL WFL *  *WFL  Hip internal rotation     Hip external rotation     Knee flexion     Knee extension     Ankle dorsiflexion     Ankle plantarflexion     Ankle inversion     Ankle eversion        (Blank rows = not tested, * pain during assessment)  LE MMT:  MMT Right 04/18/2021 Left  04/18/2021 Left  05/18/2021  Hip flexion 4/5 4/-5* 3+/5  Hip extension 4-/5 3/5* 3+/5  Hip abduction 4-/5 3+/5* 3+/5  Hip adduction 4-/5 4-/5* 4/5  Hip internal rotation     Hip external rotation     Knee flexion     Knee extension     Ankle dorsiflexion     Ankle plantarflexion     Ankle inversion     Ankle eversion      (Blank rows = not tested, *pain during testing )  LOWER EXTREMITY SPECIAL TESTS:  N/A  FUNCTIONAL TESTS:  5 times sit to stand: 18 seconds 05/23/2021 - unable to perform  GAIT: Assistive device utilized: RW Level of assistance: Complete Independence Comments: antalgic gait with decreaesd stance on the LLE, and stride on the RLE    TODAY'S TREATMENT:  [facility] Adult PT  Treatment:                                                DATE: 06/08/21 Therapeutic Exercise: NuStep level 5 x 5 LE only R side lying clamshells with pillow between knees 2 x15 SLR LLE 2 x 10 performing 6 inches Hip flexor stretch on side of table 2x30 sec, added with strap for further quad stretch 1 x30 sec LAQ left LE with 3# ankle weight 2 x15 Manual Therapy: STW and MTPR of glute med/piriformis in right sidelying with pillow between knees Grade I-II LAD LLE   Modalities: L hip MHP x 10 min in  R sidelying with pillow between the knees.     [facility] Adult PT Treatment:                                                DATE: 06/06/2021 Therapeutic Exercise: Sidelying L clamhsell 2 x 15 (with pillow between the knees to prevent from violating precautions) Nu-step L5 x 5 min LE only SLR LLE 1 x 10 performing 6 inches Hamstring stretch 2 x 30 sec. LLE LAQ 2 x 12 with 3# Manual Therapy: MTPR along the glute med/ min  Grade I-II LAD LLE  L ankle pumps for scaitic nerve glides during hamstring stretch  Modalities: L hip MHP x 10 min in  R sidelying with pillow between the knees.  Self Care: Reviewed hip precautions for the L hip  [facility] Adult PT Treatment:                                                DATE: 06/01/2021 Therapeutic Exercise: Glute sets 15 x 5 sec holds R sidelying clamshells 2x12 Standing left hip ABD 2 x10 Bridges 2x15 Manual Therapy: STW to glute med/piriformis  Therapeutic Activity: FWD step ups 4 inch step L LE leading with SBA 2 x10 Lateral steps ups 4 inch step L LE with SBA 1 x10 with 1 UE support on free motion Sit to stands from elevated table so hip is <90 degrees FL 1 x 15 with SBA and without AD Modalities: Ice left hip x10 min     PATIENT EDUCATION:  Education details: re-evaluation findings, POC, goals. reviewed HEP  and updated today  Person educated: Patient Education method: Explanation Education comprehension: verbalized understanding   HOME  EXERCISE PROGRAM: Access Code: [email or web address] URL: [email or web address] Date: 06/08/2021 Prepared by: Johny ShearsAshley Jissel Slavens  Exercises Hooklying Clamshell with Resistance - 1 x daily - 7 x weekly - 2 sets - 10 reps Supine Heel Slides (Mirrored) - 1 x daily - 7 x weekly - 2 sets - 10 reps Supine March - 1 x daily - 7 x weekly - 2 sets - 10 reps - 5 hold Supine Quad Set - 1 x daily - 7 x weekly - 2 sets - 10 reps Modified Thomas Stretch (Mirrored) - 2 x daily - 7 x weekly - 2 sets - 2 reps - 30 seconds hold Lateral Weight Shift with Parallel Bars (BKA) - 1 x daily - 7 x weekly - 1 sets - 1 min hold Standing Hip Abduction with Counter Support - 1 x daily - 7 x weekly - 1 sets - 10 reps SLR - 1 x daily - 7 x weekly - 3 sets - 10 reps - 1 hold Supine Quadriceps Stretch with Strap on Table - 1 x daily - 7 x weekly - 3 sets - 30 seconds hold        ASSESSMENT:  CLINICAL IMPRESSION: Christine Roberson arrived today with minimal left hip pain coming into the PT session after taking pain medication and muscle relaxers this morning. She demonstrates palpable tightness in left hip glute med, piriformis, and rectus femoris. She tolerated stretching and STW to these muscles. Performing LAQ were very challenging today.    IMPAIRMENTS/ DEFICITS: Objective impairments include Abnormal gait, decreased activity tolerance, decreased balance, decreased endurance, difficulty walking, decreased strength, increased edema, increased muscle spasms, postural dysfunction, and pain.  REHAB POTENTIAL: Good  CLINICAL DECISION MAKING: Stable/uncomplicated  EVALUATION COMPLEXITY: Low   GOALS:  SHORT TERM GOALS:  STG Name Target Date Goal status  1 Pt to be IND with initial HEP to progress PT  Baseline:  07/06/2021  Revised  2 Pt to be able to walk and stand for >/= 15 min for progression of endurance 07/06/2021   Revised  3 Increase L hip flexoin to >/= 90 degrees actively with </= 5/10 pain max for progression of  hip mobility  07/06/2021  Revised   LONG TERM GOALS:   LTG Name Target Date Goal status  1 Increase L hip gross strength to >/= 4/5 to promote hip stability and maximize safety  Baseline: 08/03/2021  Revised  2 Increase L hip AROM WFL compared bil to assist with general mobility required for ADLS with </= max 2/10 pain Baseline: 07/18/2021 Revised  3 Pt to be able to stand/ walk for >/= 30 min with LRAD demonstrating heel strike/ toe off pattern with min deviation for functional endurance for in home amb and short community distances Baseline: 07/18/2021 Revised  4 Pt to increase FOTO score to >/= 70% to demo improvement in function Baseline: 07/18/2021 Revised  4 Pt to be able to perform 5 x sit to stand </= 15 seconds to demo improvement in function Baseline: at eval 18 seconds, at re-eval unable to perform 07/18/2021 Revised  5 Pt to be IND with all HEP to maintain and progress curreht LOF IND 07/18/2021 Revised   PLAN: PT FREQUENCY: 2x/week  PT DURATION: 8 weeks  PLANNED INTERVENTIONS: Therapeutic exercises, Therapeutic activity, Neuro Muscular re-education, Balance training, Gait training, Patient/Family education, Joint mobilization, Stair training, DME instructions, Dry Needling, Electrical stimulation, Cryotherapy, Moist heat, Taping,  Ionotophoresis /ml Dexamethasone, and Manual therapy  PLAN FOR NEXT SESSION: Review/ update HEP PRN. Posterior hip precautions, Hip ROM, gross strengthening, gait training, weight rocking. Stair negotiation.  Continue with gentle hip stretches and strengthening per patient's tolerance.    Johny Shears, PT, DPT 06/08/21 2:43 PM

## 2021-06-13 ENCOUNTER — Ambulatory Visit: Payer: Medicare Other | Admitting: Physical Therapy

## 2021-06-13 ENCOUNTER — Encounter: Payer: Self-pay | Admitting: Physical Therapy

## 2021-06-13 ENCOUNTER — Other Ambulatory Visit: Payer: Self-pay

## 2021-06-13 DIAGNOSIS — M6281 Muscle weakness (generalized): Secondary | ICD-10-CM

## 2021-06-13 DIAGNOSIS — R2689 Other abnormalities of gait and mobility: Secondary | ICD-10-CM

## 2021-06-13 DIAGNOSIS — M25552 Pain in left hip: Secondary | ICD-10-CM

## 2021-06-13 NOTE — Therapy (Signed)
?OUTPATIENT PHYSICAL THERAPY RE-EVALUATION NOTE ? ? ?Patient Name: Christine Roberson ?MRN: [id number] ?DOB:[birth year], 56 y.o., female ?Today's Date: 06/13/2021 ? ?PCP: Josephina Gip, NP ?REFERRING PROVIDER: Josephina Gip, NP ? ? PT End of Session - 06/13/21 1421   ? ? Visit Number 8   ? Number of Visits 13   ? Date for PT Re-Evaluation 07/18/21   ? Progress Note Due on Visit 10   ? PT Start Time 1415   ? PT Stop Time 1506   ? PT Time Calculation (min) 51 min   ? Activity Tolerance Patient tolerated treatment well   ? Behavior During Therapy [facility] for tasks assessed/performed   ? ?  ?  ? ?  ? ? ? ? ? ? ? ?Past Medical History:  ?Diagnosis Date  ? Arthritis   ? Chronic back pain   ? DDD (degenerative disc disease), lumbar   ? Headache   ? Hypertension   ? MVP (mitral valve prolapse)   ? ?Past Surgical History:  ?Procedure Laterality Date  ? ABDOMINAL HYSTERECTOMY    ? CLOSED MANIPULATION SHOULDER WITH STERIOD INJECTION Right 03/05/2017  ? Procedure: CLOSED MANIPULATION SHOULDER WITH STEROID INJECTION;  Surgeon: Sheral Apley, MD;  Location: [facility];  Service: Orthopedics;  Laterality: Right;  ? JOINT REPLACEMENT Right   ? TKR  ? KNEE ARTHROSCOPY Bilateral   ? SPINAL CORD STIMULATOR IMPLANT    ? SPINAL CORD STIMULATOR REMOVAL    ? TONSILLECTOMY    ? TOTAL HIP ARTHROPLASTY Right   ? ?There are no problems to display for this patient. ? ? 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 ?THERAPY DIAG:  ?No diagnosis found. ? ?PERTINENT HISTORY: R THA , L THA 2/14 ? ?PRECAUTIONS: Posterior hip precautions ? ?  ?SUBJECTIVE: " I've been having some sorness in the hip and today its a 6/10. She reports seeing the MD tomorrow and that she is going to see if see if she can change her muscle relaxer.  ? ?PAIN:  ?Are you having pain? Yes: NPRS scale: 6/10 ?Pain location: L lateral / anterior hip ?Pain description: aching ?Aggravating factors: standing/ walking ?Relieving factors:  medication, MHP ? ? ? ? ? ? ?OBJECTIVE:  ?**Measurements assessed at evaluation unless otherwise noted by date** ? ?DIAGNOSTIC FINDINGS:  ?MRI 01/18/2020 ?IMPRESSION: ?No acute abnormality. ?Degenerative tear of the anterior, superior right labrum. ? ?PATIENT SURVEYS:  ?FOTO 50%, predicted 70% ?  06/06/2021 41% ?COGNITION: ? Overall cognitive status: Within functional limits for tasks assessed   ?  ?SENSATION: ? Light touch: Appears intact ? ?POSTURE:  ?Forward head position ? ?PALPATION: ?TTP along the glute med/ and along the anterior aspect of the acetabulofemoral joint, along the proximal adductors, piriformis and proximal rectus femoris.  ? ?LE AROM/PROM: ? ?A/PROM Right ?04/18/2021 Left ?04/18/2021 Left  05/18/2021  ?Hip flexion [facility] * *60/110  ?Hip extension [facility] WFL *   ?Hip abduction [facility] WFL * *32 in supine  ?Hip adduction [facility] WFL * ? *WFL  ?Hip internal rotation     ?Hip external rotation     ?Knee flexion     ?Knee extension     ?Ankle dorsiflexion     ?Ankle plantarflexion     ?Ankle inversion     ?Ankle eversion     ?   (Blank rows = not tested, * pain during assessment) ? ?LE MMT: ? ?MMT Right ?04/18/2021 Left ?04/18/2021 Left  05/18/2021  ?Hip flexion 4/5 4/-5* 3+/5  ?  Hip extension 4-/5 3/5* 3+/5  ?Hip abduction 4-/5 3+/5* 3+/5  ?Hip adduction 4-/5 4-/5* 4/5  ?Hip internal rotation     ?Hip external rotation     ?Knee flexion     ?Knee extension     ?Ankle dorsiflexion     ?Ankle plantarflexion     ?Ankle inversion     ?Ankle eversion     ? (Blank rows = not tested, *pain during testing ) ? ?LOWER EXTREMITY SPECIAL TESTS:  ?N/A ? ?FUNCTIONAL TESTS:  ?5 times sit to stand: 18 seconds ?05/23/2021 - unable to perform ? ?GAIT: ?Assistive device utilized: RW ?Level of assistance: Complete Independence ?Comments: antalgic gait with decreaesd stance on the LLE, and stride on the RLE ? ? ? ?TODAY'S TREATMENT: ? ?[facility] Adult PT Treatment:                                                DATE: 06/13/2021 ?Therapeutic  Exercise: ?L hip sidelying clam shell 2 x 20 with pillow between knees ?Supine hip flexion bil 2 x 20 with bil LE on red physioball ?Hip flexion 2 x 30 sec in thomas test pos. ?Bridge with RLE advanced by 6 inches to promote LLE activation 2 x 20 ?Sit to stand 3 x 10 with table lowered between sets, cues to avoid pushing LLE out and use both equally ?Manual Therapy: ?MTPR along the L glute med/ min, priformis and proximal rectus femoris ?STW along the piriformis, glute med/min ?LAD grade III ?Modalities: ?L hip MHP x 10 min in  R sidelying with pillow between the knees. ? ? [facility]                                                [facility]: 06[facility] ?Therapeutic Exercise: ?NuStep level 5 x 5 LE only ?R side lying clamshells with pillow between knees 2 x15 ?SLR LLE 2 x 10 performing 6 inches ?Hip flexor stretch on side of table 2x30 sec, added with strap for further quad stretch 1 x30 sec ?LAQ left LE with 3# ankle weight 2 x15 ?Manual Therapy: ?STW and MTPR of glute med/piriformis in right sidelying with pillow between knees ?Grade I-II LAD LLE   ?Modalities: ?L hip MHP x 10 min in  R sidelying with pillow between the knees.   ? ? ?[facility]                                                [facility]: 06/06/2021 ?Therapeutic Exercise: ?Sidelying L clamhsell 2 x 15 (with pillow between the knees to prevent from violating precautions) ?Nu-step L5 x 5 min LE only ?SLR LLE 1 x 10 performing 6 inches ?Hamstring stretch 2 x 30 sec. ?LLE LAQ 2 x 12 with 3# ?Manual Therapy: ?MTPR along the glute med/ min  ?Grade I-II LAD LLE  ?L ankle pumps for scaitic nerve glides during hamstring stretch  ?Modalities: ?L hip MHP x 10 min in  R sidelying with pillow between the knees.  ?Self Care: ?Reviewed hip precautions for the L hip ? ? ?  PATIENT EDUCATION:  ?Education details: re-evaluation findings, POC, goals. reviewed HEP and updated today  ?Person educated: Patient ?Education method: Explanation ?Education  comprehension: verbalized understanding ? ? ?HOME EXERCISE PROGRAM: ?Access Code: [email or web address] ?URL: [email or web address] ?Date: 06/08/2021 ?Prepared by: Johny Shears ? ?Exercises ?Hooklying Clamshell with Resistance - 1 x daily - 7 x weekly - 2 sets - 10 reps ?Supine Heel Slides (Mirrored) - 1 x daily - 7 x weekly - 2 sets - 10 reps ?Supine March - 1 x daily - 7 x weekly - 2 sets - 10 reps - 5 hold ?Supine Quad Set - 1 x daily - 7 x weekly - 2 sets - 10 reps ?Modified Thomas Stretch (Mirrored) - 2 x daily - 7 x weekly - 2 sets - 2 reps - 30 seconds hold ?Lateral Weight Shift with Parallel Bars (BKA) - 1 x daily - 7 x weekly - 1 sets - 1 min hold ?Standing Hip Abduction with Counter Support - 1 x daily - 7 x weekly - 1 sets - 10 reps ?SLR - 1 x daily - 7 x weekly - 3 sets - 10 reps - 1 hold ?Supine Quadriceps Stretch with Strap on Table - 1 x daily - 7 x weekly - 3 sets - 30 seconds hold ? ? ? ? ?ASSESSMENT: ? ?CLINICAL IMPRESSION: ?Pt arrives to session reporting continued soreness in the L anterior/ posterior hip. Continued working on STW along the glute med/ hip flexor and piriformis. Continued working on gross hip strengthening which she reports some soreness initially with exercise but noted improvement with continued repetitions. Continued MHP end of session to calm down soreness.  ? ?IMPAIRMENTS/ DEFICITS: Objective impairments include Abnormal gait, decreased activity tolerance, decreased balance, decreased endurance, difficulty walking, decreased strength, increased edema, increased muscle spasms, postural dysfunction, and pain. ? ?REHAB POTENTIAL: Good ? ?CLINICAL DECISION MAKING: Stable/uncomplicated ? ?EVALUATION COMPLEXITY: Low ? ? ?GOALS: ? ?SHORT TERM GOALS: ? ?STG Name Target Date Goal status  ?1 Pt to be IND with initial HEP to progress PT  ?Baseline:  07/11/2021 ? Revised  ?2 Pt to be able to walk and stand for >/= 15 min for progression of endurance 07/11/2021 ? ? Revised  ?3 Increase L  hip flexoin to >/= 90 degrees actively with </= 5/10 pain max for progression of hip mobility  07/11/2021 ? Revised  ? ?LONG TERM GOALS:  ? ?LTG Name Target Date Goal status  ?1 Increase L hip gross strength to >/= 4/5 to pr

## 2021-06-15 ENCOUNTER — Ambulatory Visit: Payer: Medicare Other | Admitting: Physical Therapy

## 2021-06-15 ENCOUNTER — Other Ambulatory Visit: Payer: Self-pay

## 2021-06-15 ENCOUNTER — Encounter: Payer: Self-pay | Admitting: Physical Therapy

## 2021-06-15 DIAGNOSIS — M25552 Pain in left hip: Secondary | ICD-10-CM | POA: Diagnosis not present

## 2021-06-15 NOTE — Therapy (Signed)
?OUTPATIENT PHYSICAL THERAPY RE-EVALUATION NOTE ? ? ?Patient Name: Christine Roberson ?MRN: [id number] ?DOB:[birth year], 56 y.o., female ?Today's Date: 06/15/2021 ? ?PCP: Josephina Gip, NP ?REFERRING PROVIDER: Josephina Gip, NP ? ? PT End of Session - 06/15/21 1408   ? ? Visit Number 9   ? Number of Visits 13   ? Date for PT Re-Evaluation 07/18/21   ? Progress Note Due on Visit 10   ? PT Start Time 1408   ? PT Stop Time 1458   ? PT Time Calculation (min) 50 min   ? Activity Tolerance Patient tolerated treatment well   ? Behavior During Therapy [facility] for tasks assessed/performed   ? ?  ?  ? ?  ? ? ? ? ? ? ? ? ?Past Medical History:  ?Diagnosis Date  ? Arthritis   ? Chronic back pain   ? DDD (degenerative disc disease), lumbar   ? Headache   ? Hypertension   ? MVP (mitral valve prolapse)   ? ?Past Surgical History:  ?Procedure Laterality Date  ? ABDOMINAL HYSTERECTOMY    ? CLOSED MANIPULATION SHOULDER WITH STERIOD INJECTION Right 03/05/2017  ? Procedure: CLOSED MANIPULATION SHOULDER WITH STEROID INJECTION;  Surgeon: Sheral Apley, MD;  Location: [facility];  Service: Orthopedics;  Laterality: Right;  ? JOINT REPLACEMENT Right   ? TKR  ? KNEE ARTHROSCOPY Bilateral   ? SPINAL CORD STIMULATOR IMPLANT    ? SPINAL CORD STIMULATOR REMOVAL    ? TONSILLECTOMY    ? TOTAL HIP ARTHROPLASTY Right   ? ?There are no problems to display for this patient. ? ? 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 ?THERAPY DIAG:  ?Pain in left hip ? ?Muscle weakness (generalized) ? ?Other abnormalities of gait and mobility ? ?PERTINENT HISTORY: R THA , L THA 2/14 ? ?PRECAUTIONS: Posterior hip precautions ? ?  ?SUBJECTIVE: " I saw the MD and hey said everything is healing well. They did change my muscle relaxer but I can't get it for a couple days." ? ?PAIN:  ?Are you having pain? Yes: NPRS scale: 5/10 ?Pain location: L lateral / anterior hip ?Pain description: aching ?Aggravating factors: standing/  walking ?Relieving factors: medication, MHP ? ? ? ? ? ? ?OBJECTIVE:  ?**Measurements assessed at evaluation unless otherwise noted by date** ? ?DIAGNOSTIC FINDINGS:  ?MRI 01/18/2020 ?IMPRESSION: ?No acute abnormality. ?Degenerative tear of the anterior, superior right labrum. ? ?PATIENT SURVEYS:  ?FOTO 50%, predicted 70% ?  06/06/2021 41% ?COGNITION: ? Overall cognitive status: Within functional limits for tasks assessed   ?  ?SENSATION: ? Light touch: Appears intact ? ?POSTURE:  ?Forward head position ? ?PALPATION: ?TTP along the glute med/ and along the anterior aspect of the acetabulofemoral joint, along the proximal adductors, piriformis and proximal rectus femoris.  ? ?LE AROM/PROM: ? ?A/PROM Right ?04/18/2021 Left ?04/18/2021 Left  05/18/2021  ?Hip flexion [facility] * *60/110  ?Hip extension [facility] WFL *   ?Hip abduction [facility] WFL * *32 in supine  ?Hip adduction [facility] WFL * ? *WFL  ?Hip internal rotation     ?Hip external rotation     ?Knee flexion     ?Knee extension     ?Ankle dorsiflexion     ?Ankle plantarflexion     ?Ankle inversion     ?Ankle eversion     ?   (Blank rows = not tested, * pain during assessment) ? ?LE MMT: ? ?MMT Right ?04/18/2021 Left ?04/18/2021 Left  05/18/2021  ?Hip flexion  4/5 4/-5* 3+/5  ?Hip extension 4-/5 3/5* 3+/5  ?Hip abduction 4-/5 3+/5* 3+/5  ?Hip adduction 4-/5 4-/5* 4/5  ?Hip internal rotation     ?Hip external rotation     ?Knee flexion     ?Knee extension     ?Ankle dorsiflexion     ?Ankle plantarflexion     ?Ankle inversion     ?Ankle eversion     ? (Blank rows = not tested, *pain during testing ) ? ?LOWER EXTREMITY SPECIAL TESTS:  ?N/A ? ?FUNCTIONAL TESTS:  ?5 times sit to stand: 18 seconds ?05/23/2021 - unable to perform ? ?GAIT: ?Assistive device utilized: RW ?Level of assistance: Complete Independence ?Comments: antalgic gait with decreaesd stance on the LLE, and stride on the RLE ? ? ? ?TODAY'S TREATMENT: ? ?[facility] Adult PT Treatment:                                                DATE:  06/15/2021 ?Therapeutic Exercise: ?Nu-step L 5 x 6min LUE only ?Standing marching in // 2 x 20 ?Standing hip abduction in // 2 x 20 ?Standing hp extension  in //2 x 20 ?SLR 2 x 12 LLE only ?Bridge with clam shell 2 x 12 with GTB ?Manual Therapy: ?LAD grade III for pain  ?Neuromuscular re-ed: ?Gait training in // forward/ backward x 6 ea - cues for heel strike/ toe off ?Modalities: ?L hip MHP x 10 min in  R sidelying with pillow between the knees. ? ? ? ?[facility]                                                [facility]: 06/13/2021 ?Therapeutic Exercise: ?L hip sidelying clam shell 2 x 20 with pillow between knees ?Supine hip flexion bil 2 x 20 with bil LE on red physioball ?Hip flexion 2 x 30 sec in thomas test pos. ?Bridge with RLE advanced by 6 inches to promote LLE activation 2 x 20 ?Sit to stand 3 x 10 with table lowered between sets, cues to avoid pushing LLE out and use both equally ?Manual Therapy: ?MTPR along the L glute med/ min, priformis and proximal rectus femoris ?STW along the piriformis, glute med/min ?LAD grade III ?Modalities: ?L hip MHP x 10 min in  R sidelying with pillow between the knees. ? ? ?[facility]                                                DATE: 06/08/21 ?Therapeutic Exercise: ?NuStep level 5 x 5 LE only ?R side lying clamshells with pillow between knees 2 x15 ?SLR LLE 2 x 10 performing 6 inches ?Hip flexor stretch on side of table 2x30 sec, added with strap for further quad stretch 1 x30 sec ?LAQ left LE with 3# ankle weight 2 x15 ?Manual Therapy: ?STW and MTPR of glute med/piriformis in right sidelying with pillow between knees ?Grade I-II LAD LLE   ?Modalities: ?L hip MHP x 10 min in  R sidelying with pillow between the knees.   ? ? ? ?PATIENT EDUCATION:  ?  Education details: re-evaluation findings, POC, goals. reviewed HEP and updated today  ?Person educated: Patient ?Education method: Explanation ?Education comprehension: verbalized understanding ? ? ?HOME  EXERCISE PROGRAM: ?Access Code: [email or web address] ?URL: [email or web address] ?Date: 06/08/2021 ?Prepared by: Johny Shears ? ?Exercises ?Hooklying Clamshell with Resistance - 1 x daily - 7 x weekly - 2 sets - 10 reps ?Supine Heel Slides (Mirrored) - 1 x daily - 7 x weekly - 2 sets - 10 reps ?Supine March - 1 x daily - 7 x weekly - 2 sets - 10 reps - 5 hold ?Supine Quad Set - 1 x daily - 7 x weekly - 2 sets - 10 reps ?Modified Thomas Stretch (Mirrored) - 2 x daily - 7 x weekly - 2 sets - 2 reps - 30 seconds hold ?Lateral Weight Shift with Parallel Bars (BKA) - 1 x daily - 7 x weekly - 1 sets - 1 min hold ?Standing Hip Abduction with Counter Support - 1 x daily - 7 x weekly - 1 sets - 10 reps ?SLR - 1 x daily - 7 x weekly - 3 sets - 10 reps - 1 hold ?Supine Quadriceps Stretch with Strap on Table - 1 x daily - 7 x weekly - 3 sets - 30 seconds hold ? ? ? ? ?ASSESSMENT: ? ?CLINICAL IMPRESSION: ?Pt reports pain at 5/10 today. Continued working on gross hip strengthening progressing to standing with standing rest breaks to promote endurance. Practiced gait training in the // focusing on heel strike/ toe off. Pt reports soreness limiting her endurance but was able to complete all exercises requiring intermittent breaks. Continued MHP end of session.  ? ?IMPAIRMENTS/ DEFICITS: Objective impairments include Abnormal gait, decreased activity tolerance, decreased balance, decreased endurance, difficulty walking, decreased strength, increased edema, increased muscle spasms, postural dysfunction, and pain. ? ?REHAB POTENTIAL: Good ? ?CLINICAL DECISION MAKING: Stable/uncomplicated ? ?EVALUATION COMPLEXITY: Low ? ? ?GOALS: ? ?SHORT TERM GOALS: ? ?STG Name Target Date Goal status  ?1 Pt to be IND with initial HEP to progress PT  ?Baseline:  07/13/2021 ? Revised  ?2 Pt to be able to walk and stand for >/= 15 min for progression of endurance 07/13/2021 ? ? Revised  ?3 Increase L hip flexoin to >/= 90 degrees actively with </=  5/10 pain max for progression of hip mobility  07/13/2021 ? Revised  ? ?LONG TERM GOALS:  ? ?LTG Name Target Date Goal status  ?1 Increase L hip gross strength to >/= 4/5 to promote hip stability and maximize safet

## 2021-06-19 ENCOUNTER — Other Ambulatory Visit: Payer: Self-pay

## 2021-06-19 ENCOUNTER — Ambulatory Visit: Payer: Medicare Other | Admitting: Physical Therapy

## 2021-06-19 ENCOUNTER — Encounter: Payer: Self-pay | Admitting: Physical Therapy

## 2021-06-19 DIAGNOSIS — M6281 Muscle weakness (generalized): Secondary | ICD-10-CM

## 2021-06-19 DIAGNOSIS — R2689 Other abnormalities of gait and mobility: Secondary | ICD-10-CM

## 2021-06-19 DIAGNOSIS — M25552 Pain in left hip: Secondary | ICD-10-CM

## 2021-06-19 NOTE — Therapy (Signed)
?OUTPATIENT PHYSICAL THERAPY RE-EVALUATION NOTE ?Progress Note ?Reporting Period 04/18/2021 to 06/19/2021 ? ?See note below for Objective Data and Assessment of Progress/Goals.  ? ?  ? ?Patient Name: Christine Roberson ?MRN: [id number] ?DOB:[birth year], 56 y.o., female ?Today's Date: 06/19/2021 ? ?PCP: Josephina Gip, NP ?REFERRING PROVIDER: Josephina Gip, NP ? ? PT End of Session - 06/19/21 1343   ? ? Visit Number 10   ? Number of Visits 13   ? Date for PT Re-Evaluation 07/18/21   ? Progress Note Due on Visit 20   ? PT Start Time 1340   ? PT Stop Time 1444   ? PT Time Calculation (min) 64 min   ? Activity Tolerance Patient tolerated treatment well   ? Behavior During Therapy [facility] for tasks assessed/performed   ? ?  ?  ? ?  ? ? ? ? ? ? ? ? ? ?Past Medical History:  ?Diagnosis Date  ? Arthritis   ? Chronic back pain   ? DDD (degenerative disc disease), lumbar   ? Headache   ? Hypertension   ? MVP (mitral valve prolapse)   ? ?Past Surgical History:  ?Procedure Laterality Date  ? ABDOMINAL HYSTERECTOMY    ? CLOSED MANIPULATION SHOULDER WITH STERIOD INJECTION Right 03/05/2017  ? Procedure: CLOSED MANIPULATION SHOULDER WITH STEROID INJECTION;  Surgeon: Sheral Apley, MD;  Location: [facility];  Service: Orthopedics;  Laterality: Right;  ? JOINT REPLACEMENT Right   ? TKR  ? KNEE ARTHROSCOPY Bilateral   ? SPINAL CORD STIMULATOR IMPLANT    ? SPINAL CORD STIMULATOR REMOVAL    ? TONSILLECTOMY    ? TOTAL HIP ARTHROPLASTY Right   ? ?There are no problems to display for this patient. ? ? 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 ?THERAPY DIAG:  ?Pain in left hip ? ?Muscle weakness (generalized) ? ?Other abnormalities of gait and mobility ? ?PERTINENT HISTORY: R THA , L THA 2/14 ? ?PRECAUTIONS: Posterior hip precautions ? ?  ?SUBJECTIVE: " I am doing alittle better today, pain is more of a 5/10." ? ?PAIN:  ?Are you having pain? Yes: NPRS scale: 5/10 ?Pain location: L anterior hip ?Pain  description: aching / sore ?Aggravating factors: standing/ walking ?Relieving factors: sitting/ resting, medication ? ? ? ? ? ? ?OBJECTIVE:  ?**Measurements assessed at evaluation unless otherwise noted by date** ? ?DIAGNOSTIC FINDINGS:  ?MRI 01/18/2020 ?IMPRESSION: ?No acute abnormality. ?Degenerative tear of the anterior, superior right labrum. ? ?PATIENT SURVEYS:  ?FOTO 50%, predicted 70% ?  06/06/2021 41% ?  06/19/2021  55% limited ? ?COGNITION: ? Overall cognitive status: Within functional limits for tasks assessed   ?  ?SENSATION: ? Light touch: Appears intact ? ?POSTURE:  ?Forward head position ? ?PALPATION: ?TTP along the glute med/ and along the anterior aspect of the acetabulofemoral joint, along the proximal adductors, piriformis and proximal rectus femoris.  ? ?LE AROM/PROM: ? ?A/PROM Right ?04/18/2021 Left ?04/18/2021 Left  05/18/2021  ?Hip flexion [facility] * *60/110  ?Hip extension [facility] WFL *   ?Hip abduction [facility] WFL * *32 in supine  ?Hip adduction [facility] WFL * ? *WFL  ?Hip internal rotation     ?Hip external rotation     ?Knee flexion     ?Knee extension     ?Ankle dorsiflexion     ?Ankle plantarflexion     ?Ankle inversion     ?Ankle eversion     ?   (Blank rows = not tested, * pain during  assessment) ? ?LE MMT: ? ?MMT Right ?04/18/2021 Left ?04/18/2021 Left  05/18/2021 Left ?06/19/2021  ?Hip flexion 4/5 4/-5* 3+/5 4/5  ?Hip extension 4-/5 3/5* 3+/5 4-/5  ?Hip abduction 4-/5 3+/5* 3+/5 4-/5  ?Hip adduction 4-/5 4-/5* 4/5 4/5  ?Hip internal rotation      ?Hip external rotation      ?Knee flexion      ?Knee extension      ?Ankle dorsiflexion      ?Ankle plantarflexion      ?Ankle inversion      ?Ankle eversion      ? (Blank rows = not tested, *pain during testing ) ? ?LOWER EXTREMITY SPECIAL TESTS:  ?N/A ? ?FUNCTIONAL TESTS:  ?5 times sit to stand: 18 seconds ?05/23/2021 - unable to perform ? ?GAIT: ?Assistive device utilized: RW ?Level of assistance: Complete Independence ?Comments: antalgic gait with decreaesd  stance on the LLE, and stride on the RLE ? ? ? ?TODAY'S TREATMENT: ? ?[facility] Adult PT Treatment:                                                DATE: 06/19/2021 ?Therapeutic Exercise: ?Nu-step L6 x 6 min LE only ?Standing hip flexor stretch 2 x 30 second with HHA from freemotion for stability ?3 way hip strengthening bil flexion/ abd/ extension 1 x 12 with YTB ?Standing functional squat with bil HHA from freemotion 2 x 10 ?Marching in place 2 x 10 with YTB around the feet with bil HHA from freemotion ?Manual Therapy: ?LAD grade III LLE  ?Neuromuscular re-ed: ?Gait training using SPC 2 x 30 ft with gait belt.  ?Modalities: ?L hip MHP x 10 min in supine ? ? ?[facility] Adult PT Treatment:                                                DATE: 06/15/2021 ?Therapeutic Exercise: ?Nu-step L 5 x LUE only ?Standing marching in // 2 x 20 ?Standing hip abduction in // 2 x 20 ?Standing hp extension  in //2 x 20 ?SLR 2 x 12 LLE only ?Bridge with clam shell 2 x 12 with GTB ?Manual Therapy: ?LAD grade III for pain  ?Neuromuscular re-ed: ?Gait training in // forward/ backward x 6 ea - cues for heel strike/ toe off ?Modalities: ?L hip MHP x 10 min in  R sidelying with pillow between the knees. ? ? ? ?[facility] Adult PT Treatment:                                                DATE: 06/13/2021 ?Therapeutic Exercise: ?L hip sidelying clam shell 2 x 20 with pillow between knees ?Supine hip flexion bil 2 x 20 with bil LE on red physioball ?Hip flexion 2 x 30 sec in thomas test pos. ?Bridge with RLE advanced by 6 inches to promote LLE activation 2 x 20 ?Sit to stand 3 x 10 with table lowered between sets, cues to avoid pushing LLE out and use both equally ?Manual Therapy: ?MTPR along the L glute med/ min, priformis and proximal rectus femoris ?STW  along the piriformis, glute med/min ?LAD grade III ?Modalities: ?L hip MHP x 10 min in  R sidelying with pillow between the knees. ? ? ? ?PATIENT EDUCATION:  ?Education details: re-evaluation findings,  POC, goals. reviewed HEP and updated today  ?Person educated: Patient ?Education method: Explanation ?Education comprehension: verbalized understanding ? ? ?HOME EXERCISE PROGRAM: ?Access Code: [email or web address] ?URL: [email or web address] ?Date: 06/08/2021 ?Prepared by: Johny ShearsAshley Mustain ? ?Exercises ?Hooklying Clamshell with Resistance - 1 x daily - 7 x weekly - 2 sets - 10 reps ?Supine Heel Slides (Mirrored) - 1 x daily - 7 x weekly - 2 sets - 10 reps ?Supine March - 1 x daily - 7 x weekly - 2 sets - 10 reps - 5 hold ?Supine Quad Set - 1 x daily - 7 x weekly - 2 sets - 10 reps ?Modified Thomas Stretch (Mirrored) - 2 x daily - 7 x weekly - 2 sets - 2 reps - 30 seconds hold ?Lateral Weight Shift with Parallel Bars (BKA) - 1 x daily - 7 x weekly - 1 sets - 1 min hold ?Standing Hip Abduction with Counter Support - 1 x daily - 7 x weekly - 1 sets - 10 reps ?SLR - 1 x daily - 7 x weekly - 3 sets - 10 reps - 1 hold ?Supine Quadriceps Stretch with Strap on Table - 1 x daily - 7 x weekly - 3 sets - 30 seconds hold ? ? ? ? ?ASSESSMENT: ? ?CLINICAL IMPRESSION: ?Pt is making good progress with physical therapy increasing hip/ knee strength. She does continue to report pain rated at 5/10 in the L anterior hip. She is making good progress toward her goals meeting all her STG. Continued working gross hip strengthening, she did good with all exercises but does fatigue quickly. Practiced gait training with SPC and gait belt, she demonstrates increased step length with LLE but shorted stride with RLE. Continued use of MHP end of session to calm down soreness from the session. She is making good progress and would benefit from continued physical therapy to promote strength/ stability, improve gait with LRAD and and maximize her function by addressing the deficits listed.  ? ?IMPAIRMENTS/ DEFICITS: Objective impairments include Abnormal gait, decreased activity tolerance, decreased balance, decreased endurance, difficulty walking,  decreased strength, increased edema, increased muscle spasms, postural dysfunction, and pain. ? ?REHAB POTENTIAL: Good ? ?CLINICAL DECISION MAKING: Stable/uncomplicated ? ?EVALUATION COMPLEXITY: Low ? ? ?GOALS:

## 2021-06-21 ENCOUNTER — Encounter: Payer: Self-pay | Admitting: Physical Therapy

## 2021-06-21 ENCOUNTER — Other Ambulatory Visit: Payer: Self-pay

## 2021-06-21 ENCOUNTER — Ambulatory Visit: Payer: Medicare Other | Admitting: Physical Therapy

## 2021-06-21 DIAGNOSIS — R2689 Other abnormalities of gait and mobility: Secondary | ICD-10-CM

## 2021-06-21 DIAGNOSIS — M25552 Pain in left hip: Secondary | ICD-10-CM

## 2021-06-21 DIAGNOSIS — M6281 Muscle weakness (generalized): Secondary | ICD-10-CM

## 2021-06-21 NOTE — Therapy (Signed)
?OUTPATIENT PHYSICAL THERAPY TREATMENT NOTE ? ? ? ?  ? ?Patient Name: Christine Roberson ?MRN: [id number] ?DOB:1966-04-02, 56 y.o., female ?Today's Date: 06/21/2021 ? ?PCP: Daleen Snook, NP ?REFERRING PROVIDER: Rosanne Ashing, MD ? ? PT End of Session - 06/21/21 1358   ? ? Visit Number 11   ? Number of Visits 13   ? Date for PT Re-Evaluation 07/18/21   ? Progress Note Due on Visit 20   ? PT Start Time 1358   ? Activity Tolerance Patient tolerated treatment well   ? Behavior During Therapy [facility] for tasks assessed/performed   ? ?  ?  ? ?  ? ? ? ? ? ? ? ? ? ? ?Past Medical History:  ?Diagnosis Date  ? Arthritis   ? Chronic back pain   ? DDD (degenerative disc disease), lumbar   ? Headache   ? Hypertension   ? MVP (mitral valve prolapse)   ? ?Past Surgical History:  ?Procedure Laterality Date  ? ABDOMINAL HYSTERECTOMY    ? CLOSED MANIPULATION SHOULDER WITH STERIOD INJECTION Right 03/05/2017  ? Procedure: CLOSED MANIPULATION SHOULDER WITH STEROID INJECTION;  Surgeon: Renette Butters, MD;  Location: [place];  Service: Orthopedics;  Laterality: Right;  ? JOINT REPLACEMENT Right   ? TKR  ? KNEE ARTHROSCOPY Bilateral   ? SPINAL CORD STIMULATOR IMPLANT    ? SPINAL CORD STIMULATOR REMOVAL    ? TONSILLECTOMY    ? TOTAL HIP ARTHROPLASTY Right   ? ?There are no problems to display for this patient. ? ? 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 ?THERAPY DIAG:  ?No diagnosis found. ? ?PERTINENT HISTORY: R THA , L THA 2/14 ? ?PRECAUTIONS: Posterior hip precautions ? ?  ?SUBJECTIVE: " I was feeling sore earlier today, and yesterday was pretty sore which that muscle that goes to my knee was bothering me." ? ?PAIN:  ?Are you having pain? Yes: NPRS scale: 5/10 ?Pain location: L anterior hip ?Pain description: aching / sore ?Aggravating factors: standing/ walking ?Relieving factors: sitting/ resting, medication ? ? ? ? ? ? ?OBJECTIVE:  ?**Measurements assessed at evaluation unless otherwise  noted by date** ? ?DIAGNOSTIC FINDINGS:  ?MRI 01/18/2020 ?IMPRESSION: ?No acute abnormality. ?Degenerative tear of the anterior, superior right labrum. ? ?PATIENT SURVEYS:  ?FOTO 50%, predicted 70% ?  06/06/2021 41% ?  06/19/2021  55% limited ? ?COGNITION: ? Overall cognitive status: Within functional limits for tasks assessed   ?  ?SENSATION: ? Light touch: Appears intact ? ?POSTURE:  ?Forward head position ? ?PALPATION: ?TTP along the glute med/ and along the anterior aspect of the acetabulofemoral joint, along the proximal adductors, piriformis and proximal rectus femoris.  ? ?LE AROM/PROM: ? ?A/PROM Right ?04/18/2021 Left ?04/18/2021 Left  05/18/2021  ?Hip flexion [facility] * *60/110  ?Hip extension [facility] WFL *   ?Hip abduction [facility] WFL * *32 in supine  ?Hip adduction [facility] WFL * ? *WFL  ?Hip internal rotation     ?Hip external rotation     ?Knee flexion     ?Knee extension     ?Ankle dorsiflexion     ?Ankle plantarflexion     ?Ankle inversion     ?Ankle eversion     ?   (Blank rows = not tested, * pain during assessment) ? ?LE MMT: ? ?MMT Right ?04/18/2021 Left ?04/18/2021 Left  05/18/2021 Left ?06/19/2021  ?Hip flexion 4/5 4/-5* 3+/5 4/5  ?Hip extension 4-/5 3/5* 3+/5 4-/5  ?Hip abduction 4-/5 3+/5*  3+/5 4-/5  ?Hip adduction 4-/5 4-/5* 4/5 4/5  ?Hip internal rotation      ?Hip external rotation      ?Knee flexion      ?Knee extension      ?Ankle dorsiflexion      ?Ankle plantarflexion      ?Ankle inversion      ?Ankle eversion      ? (Blank rows = not tested, *pain during testing ) ? ?LOWER EXTREMITY SPECIAL TESTS:  ?N/A ? ?FUNCTIONAL TESTS:  ?5 times sit to stand: 18 seconds ?05/23/2021 - unable to perform ? ?GAIT: ?Assistive device utilized: RW ?Level of assistance: Complete Independence ?Comments: antalgic gait with decreaesd stance on the LLE, and stride on the RLE ? ? ? ?TODAY'S TREATMENT: ? ?[place] Adult PT Treatment:                                                DATE: 06/21/2021 ?Therapeutic Exercise: ?Nu-step L5 x 5 min   ?Step up/ down with  6 inch 2 x 10 with SPC and 1 HHA from  ?Sidelying hip abduction 2 x 15 ?Sit to stand 2 x 12 with with 10# Kettlebell ?Manual Therapy: ?LAD grade III LLE ?MTPR along the vastus lateralis x 3 on the L ?Neuromuscular re-ed: ?Gait training with pt's SPC 4 x 50 ft ?Modalities: ?L hip MHP x 10 min in supine ?Self Care: ?Reviewed walking SPC and proper technique ? ?[facility] Adult PT Treatment:                                                DATE: 06/19/2021 ?Therapeutic Exercise: ?Nu-step L6 x 6 min LE only ?Standing hip flexor stretch 2 x 30 second with HHA from freemotion for stability ?3 way hip strengthening bil flexion/ abd/ extension 1 x 12 with YTB ?Standing functional squat with bil HHA from freemotion 2 x 10 ?Marching in place 2 x 10 with YTB around the feet with bil HHA from freemotion ?Manual Therapy: ?LAD grade III LLE  ?Neuromuscular re-ed: ?Gait training using SPC 2 x 30 ft with gait belt.  ?Modalities: ?L hip MHP x 10 min in supine ? ? ?[facility] Adult PT Treatment:                                                DATE: 06/15/2021 ?Therapeutic Exercise: ?Nu-step L 5 x 38mn LUE only ?Standing marching in // 2 x 20 ?Standing hip abduction in // 2 x 20 ?Standing hp extension  in //2 x 20 ?SLR 2 x 12 LLE only ?Bridge with clam shell 2 x 12 with GTB ?Manual Therapy: ?LAD grade III for pain  ?Neuromuscular re-ed: ?Gait training in // forward/ backward x 6 ea - cues for heel strike/ toe off ?Modalities: ?L hip MHP x 10 min in  R sidelying with pillow between the knees. ? ?PATIENT EDUCATION:  ?Education details: re-evaluation findings, POC, goals. reviewed HEP and updated today  ?Person educated: Patient ?Education method: Explanation ?Education comprehension: verbalized understanding ? ? ?HOME EXERCISE PROGRAM: ?Access Code: [email or web address]?URL: [email or web address] ?Date: 06/08/2021 ?  Prepared by: Edythe Lynn ? ?Exercises ?Hooklying Clamshell with Resistance - 1 x daily - 7 x weekly - 2 sets -  10 reps ?Supine Heel Slides (Mirrored) - 1 x daily - 7 x weekly - 2 sets - 10 reps ?Supine March - 1 x daily - 7 x weekly - 2 sets - 10 reps - 5 hold ?Supine Quad Set - 1 x daily - 7 x weekly - 2 sets - 10 reps ?Modified Thomas Stretch (Mirrored) - 2 x daily - 7 x weekly - 2 sets - 2 reps - 30 seconds hold ?Lateral Weight Shift with Parallel Bars (BKA) - 1 x daily - 7 x weekly - 1 sets - 1 min hold ?Standing Hip Abduction with Counter Support - 1 x daily - 7 x weekly - 1 sets - 10 reps ?SLR - 1 x daily - 7 x weekly - 3 sets - 10 reps - 1 hold ?Supine Quadriceps Stretch with Strap on Table - 1 x daily - 7 x weekly - 3 sets - 30 seconds hold ? ? ? ? ?ASSESSMENT: ? ?CLINICAL IMPRESSION: ?Mrs Neises reports continued L hip pain today rated at 5/10. Continued focus on gross LLE strengthening which she noted soreness but was able to complete all exercises today. Pt brought her SPC, practiced gait biomechanics with her SPC requiring min cues for proper form. Pt did quite well with step ups onto a 6 inch step, plan to work on stairs next session. Continued MHP end of session to calm down soreness.  ? ?IMPAIRMENTS/ DEFICITS: Objective impairments include Abnormal gait, decreased activity tolerance, decreased balance, decreased endurance, difficulty walking, decreased strength, increased edema, increased muscle spasms, postural dysfunction, and pain. ? ?REHAB POTENTIAL: Good ? ?CLINICAL DECISION MAKING: Stable/uncomplicated ? ?EVALUATION COMPLEXITY: Low ? ? ?GOALS: ? ?SHORT TERM GOALS: ? ?STG Name Target Date Goal status  ?1 Pt to be IND with initial HEP to progress PT  ?Baseline:  07/19/2021 ? Met  ?06/19/2021  ?2 Pt to be able to walk and stand for >/= 15 min for progression of endurance 07/19/2021 ? ? Met ?06/19/2021  ?3 Increase L hip flexoin to >/= 90 degrees actively with </= 5/10 pain max for progression of hip mobility  07/19/2021 ? Met ?06/19/2021  ? ?LONG TERM GOALS:  ? ?LTG Name Target Date Goal status  ?1 Increase L hip  gross strength to >/= 4/5 to promote hip stability and maximize safety  ?Baseline: 08/16/2021 ? Ongoing ?06/19/2021  ?2 Increase L hip AROM WFL compared bil to assist with general mobility required for ADLS w

## 2021-06-26 ENCOUNTER — Encounter: Payer: Self-pay | Admitting: Physical Therapy

## 2021-06-26 ENCOUNTER — Other Ambulatory Visit: Payer: Self-pay

## 2021-06-26 ENCOUNTER — Ambulatory Visit: Payer: Medicare Other | Admitting: Physical Therapy

## 2021-06-26 DIAGNOSIS — R2689 Other abnormalities of gait and mobility: Secondary | ICD-10-CM

## 2021-06-26 DIAGNOSIS — M25552 Pain in left hip: Secondary | ICD-10-CM | POA: Diagnosis not present

## 2021-06-26 DIAGNOSIS — M6281 Muscle weakness (generalized): Secondary | ICD-10-CM

## 2021-06-26 NOTE — Therapy (Signed)
?OUTPATIENT PHYSICAL THERAPY TREATMENT NOTE ? ? ? ?  ? ?Patient Name: Christine Roberson ?MRN: [id number] ?DOB:[birth year], 56 y.o., female ?Today's Date: 06/26/2021 ? ?PCP: Daleen Snook, NP ?REFERRING PROVIDER: Daleen Snook, NP ? ? PT End of Session - 06/26/21 1417   ? ? Visit Number 12   ? Number of Visits 13   ? Date for PT Re-Evaluation 07/18/21   ? Progress Note Due on Visit 20   ? PT Start Time 4356   ? PT Stop Time 1507   ? PT Time Calculation (min) 50 min   ? Activity Tolerance Patient tolerated treatment well   ? Behavior During Therapy [facility] for tasks assessed/performed   ? ?  ?  ? ?  ? ? ? ? ? ? ? ? ? ? ? ?Past Medical History:  ?Diagnosis Date  ? Arthritis   ? Chronic back pain   ? DDD (degenerative disc disease), lumbar   ? Headache   ? Hypertension   ? MVP (mitral valve prolapse)   ? ?Past Surgical History:  ?Procedure Laterality Date  ? ABDOMINAL HYSTERECTOMY    ? CLOSED MANIPULATION SHOULDER WITH STERIOD INJECTION Right 03/05/2017  ? Procedure: CLOSED MANIPULATION SHOULDER WITH STEROID INJECTION;  Surgeon: Renette Butters, MD;  Location: [place];  Service: Orthopedics;  Laterality: Right;  ? JOINT REPLACEMENT Right   ? TKR  ? KNEE ARTHROSCOPY Bilateral   ? SPINAL CORD STIMULATOR IMPLANT    ? SPINAL CORD STIMULATOR REMOVAL    ? TONSILLECTOMY    ? TOTAL HIP ARTHROPLASTY Right   ? ?There are no problems to display for this patient. ? ? 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 ?THERAPY DIAG:  ?No diagnosis found. ? ?PERTINENT HISTORY: R THA , L THA 2/14 ? ?PRECAUTIONS: Posterior hip precautions ? ?SUBJECTIVE: "the hip isn't that bad. Its getting better but I do get some soreness in the hip." ? ?PAIN:  ?Are you having pain? Yes: NPRS scale: 5/10 ?Pain location: outside of the hip ?Pain description: standing/ walking  ?Aggravating factors: quick movements ?Relieving factors: medication ? ? ? ? ? ? ? ?OBJECTIVE:  ?*Unless otherwise noted by date, all objective  measures were captured on initial evaluation.  ? ? ?DIAGNOSTIC FINDINGS:  ?MRI 01/18/2020 ?IMPRESSION: ?No acute abnormality. ?Degenerative tear of the anterior, superior right labrum. ? ?PATIENT SURVEYS:  ?FOTO 50%, predicted 70% ?  06/06/2021 41% ?  06/19/2021  55% limited ? ?COGNITION: ? Overall cognitive status: Within functional limits for tasks assessed   ?  ?SENSATION: ? Light touch: Appears intact ? ?POSTURE:  ?Forward head position ? ?PALPATION: ?TTP along the glute med/ and along the anterior aspect of the acetabulofemoral joint, along the proximal adductors, piriformis and proximal rectus femoris.  ? ?LE AROM/PROM: ? ?A/PROM Right ?04/18/2021 Left ?04/18/2021 Left  05/18/2021  ?Hip flexion [facility] * *60/110  ?Hip extension [facility] WFL *   ?Hip abduction [facility] WFL * *32 in supine  ?Hip adduction [facility] WFL * ? *WFL  ?Hip internal rotation     ?Hip external rotation     ?Knee flexion     ?Knee extension     ?Ankle dorsiflexion     ?Ankle plantarflexion     ?Ankle inversion     ?Ankle eversion     ?   (Blank rows = not tested, * pain during assessment) ? ?LE MMT: ? ?MMT Right ?04/18/2021 Left ?04/18/2021 Left  05/18/2021 Left ?06/19/2021  ?Hip flexion 4/5  4/-5* 3+/5 4/5  ?Hip extension 4-/5 3/5* 3+/5 4-/5  ?Hip abduction 4-/5 3+/5* 3+/5 4-/5  ?Hip adduction 4-/5 4-/5* 4/5 4/5  ?Hip internal rotation      ?Hip external rotation      ?Knee flexion      ?Knee extension      ?Ankle dorsiflexion      ?Ankle plantarflexion      ?Ankle inversion      ?Ankle eversion      ? (Blank rows = not tested, *pain during testing ) ? ?LOWER EXTREMITY SPECIAL TESTS:  ?N/A ? ?FUNCTIONAL TESTS:  ?5 times sit to stand: 18 seconds ?05/23/2021 - unable to perform ? ?GAIT: ?Assistive device utilized: RW ?Level of assistance: Complete Independence ?Comments: antalgic gait with decreaesd stance on the LLE, and stride on the RLE ? ? ? ?TODAY'S TREATMENT: ? ?[place] Adult PT Treatment:                                                DATE:  06/26/2021 ?Therapeutic Exercise: ?Nu-step L5 x 2:30 min , L6 x 2:30 LE only ?Leg press L bil LE 2 x 10 20# ?Manual Therapy: ?MTPR along the L glute med/ piriformis ?STW along the glute med ?LAD grade III LLE ?Neuromuscular re-ed: ?Standing wall slides reaching up with RUE with stepping onto L leg to promote glute med  ?Gait training with SPC with CGA for safety  ?Therapeutic Activity: ?Going up/ down 6 inch steps x 5 with SPC - utilized distraction technique, with gait belt for safety ?Modalities: ?L hip MHP x 10 min in supine ? ? ?[facility] Adult PT Treatment:                                                DATE: 06/21/2021 ?Therapeutic Exercise: ?Nu-step L5 x 5 min  ?Step up/ down with  6 inch 2 x 10 with SPC and 1 HHA from  ?Sidelying hip abduction 2 x 15 ?Sit to stand 2 x 12 with with 10# Kettlebell ?Manual Therapy: ?LAD grade III LLE ?MTPR along the vastus lateralis x 3 on the L ?Neuromuscular re-ed: ?Gait training with pt's SPC 4 x 50 ft ?Modalities: ?L hip MHP x 10 min in supine ?Self Care: ?Reviewed walking SPC and proper technique ? ?[facility] Adult PT Treatment:                                                DATE: 06/19/2021 ?Therapeutic Exercise: ?Nu-step L6 x 6 min LE only ?Standing hip flexor stretch 2 x 30 second with HHA from freemotion for stability ?3 way hip strengthening bil flexion/ abd/ extension 1 x 12 with YTB ?Standing functional squat with bil HHA from freemotion 2 x 10 ?Marching in place 2 x 10 with YTB around the feet with bil HHA from freemotion ?Manual Therapy: ?LAD grade III LLE  ?Neuromuscular re-ed: ?Gait training using SPC 2 x 30 ft with gait belt.  ?Modalities: ?L hip MHP x 10 min in supine ? ? ?PATIENT EDUCATION:  ?Education details: re-evaluation findings, POC, goals. reviewed HEP and  updated today  ?Person educated: Patient ?Education method: Explanation ?Education comprehension: verbalized understanding ? ? ?HOME EXERCISE PROGRAM: ?Access Code: [email or web address] ?URL:  [email or web address] ?Date: 06/08/2021 ?Prepared by: Edythe Lynn ? ?Exercises ?Hooklying Clamshell with Resistance - 1 x daily - 7 x weekly - 2 sets - 10 reps ?Supine Heel Slides (Mirrored) - 1 x daily - 7 x weekly - 2 sets - 10 reps ?Supine March - 1 x daily - 7 x weekly - 2 sets - 10 reps - 5 hold ?Supine Quad Set - 1 x daily - 7 x weekly - 2 sets - 10 reps ?Modified Thomas Stretch (Mirrored) - 2 x daily - 7 x weekly - 2 sets - 2 reps - 30 seconds hold ?Lateral Weight Shift with Parallel Bars (BKA) - 1 x daily - 7 x weekly - 1 sets - 1 min hold ?Standing Hip Abduction with Counter Support - 1 x daily - 7 x weekly - 1 sets - 10 reps ?SLR - 1 x daily - 7 x weekly - 3 sets - 10 reps - 1 hold ?Supine Quadriceps Stretch with Strap on Table - 1 x daily - 7 x weekly - 3 sets - 30 seconds hold ? ? ? ? ?ASSESSMENT: ? ?CLINICAL IMPRESSION: ?Pt arrives to session reporting that the hip still gives her trouble located along the lateral aspect of the hip. Despite having continued pain/ muscle stiffness she does report it is getting better. Worked on stair training and gross hip strengthening utilizing double limb leg press, and neuromuscular activation techniques to maximize glute med activation. She is doing well with ambulating with a SPC however she does fatigue quickly which results in RLE muscle fasculations and can potentially impact safety. Instructed to use SPC at home, and RW when she is out for for long distances.  ? ?IMPAIRMENTS/ DEFICITS: Objective impairments include Abnormal gait, decreased activity tolerance, decreased balance, decreased endurance, difficulty walking, decreased strength, increased edema, increased muscle spasms, postural dysfunction, and pain. ? ?REHAB POTENTIAL: Good ? ?CLINICAL DECISION MAKING: Stable/uncomplicated ? ?EVALUATION COMPLEXITY: Low ? ? ?GOALS: ? ?SHORT TERM GOALS: ? ?STG Name Target Date Goal status  ?1 Pt to be IND with initial HEP to progress PT  ?Baseline:   07/24/2021 ? Met  ?06/19/2021  ?2 Pt to be able to walk and stand for >/= 15 min for progression of endurance 07/24/2021 ? ? Met ?06/19/2021  ?3 Increase L hip flexoin to >/= 90 degrees actively with </= 5/10 pain max for progression of hip mobility  07/24/2021

## 2021-06-27 ENCOUNTER — Encounter: Payer: Medicare Other | Admitting: Physical Therapy

## 2021-06-29 ENCOUNTER — Encounter: Payer: Self-pay | Admitting: Physical Therapy

## 2021-06-29 ENCOUNTER — Ambulatory Visit: Payer: Medicare Other | Admitting: Physical Therapy

## 2021-06-29 DIAGNOSIS — M25552 Pain in left hip: Secondary | ICD-10-CM | POA: Diagnosis not present

## 2021-06-29 DIAGNOSIS — R2689 Other abnormalities of gait and mobility: Secondary | ICD-10-CM

## 2021-06-29 DIAGNOSIS — M6281 Muscle weakness (generalized): Secondary | ICD-10-CM

## 2021-06-29 NOTE — Therapy (Signed)
?OUTPATIENT PHYSICAL THERAPY TREATMENT NOTE / Re-certification ? ? ? ?  ? ?Patient Name: Christine Roberson ?MRN: [id number] ?DOB:26-Nov-1965, 56 y.o., female ?Today's Date: 06/29/2021 ? ?PCP: Daleen Snook, NP ?REFERRING PROVIDER: Daleen Snook, NP ? ? PT End of Session - 06/29/21 1418   ? ? Visit Number 13   ? Number of Visits 25   ? Date for PT Re-Evaluation 08/24/21   ? Progress Note Due on Visit 20   ? PT Start Time 1415   ? PT Stop Time 1507   ? PT Time Calculation (min) 52 min   ? Activity Tolerance Patient tolerated treatment well   ? Behavior During Therapy [facility] for tasks assessed/performed   ? ?  ?  ? ?  ? ? ? ? ? ? ? ? ? ? ? ? ?Past Medical History:  ?Diagnosis Date  ? Arthritis   ? Chronic back pain   ? DDD (degenerative disc disease), lumbar   ? Headache   ? Hypertension   ? MVP (mitral valve prolapse)   ? ?Past Surgical History:  ?Procedure Laterality Date  ? ABDOMINAL HYSTERECTOMY    ? CLOSED MANIPULATION SHOULDER WITH STERIOD INJECTION Right 03/05/2017  ? Procedure: CLOSED MANIPULATION SHOULDER WITH STEROID INJECTION;  Surgeon: Renette Butters, MD;  Location: [place];  Service: Orthopedics;  Laterality: Right;  ? JOINT REPLACEMENT Right   ? TKR  ? KNEE ARTHROSCOPY Bilateral   ? SPINAL CORD STIMULATOR IMPLANT    ? SPINAL CORD STIMULATOR REMOVAL    ? TONSILLECTOMY    ? TOTAL HIP ARTHROPLASTY Right   ? ?There are no problems to display for this patient. ? ? 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 ?THERAPY DIAG:  ?Pain in left hip ? ?Muscle weakness (generalized) ? ?Other abnormalities of gait and mobility ? ?PERTINENT HISTORY: R THA , L THA 2/14 ? ?PRECAUTIONS: Posterior hip precautions ? ?SUBJECTIVE: "I am feeling really tight in the area of the incions  ? ?PAIN:  ?Are you having pain? Yes: NPRS scale: 5/10 ?Pain location: outside of the hip ?Pain description: standing/ walking  ?Aggravating factors: quick movements ?Relieving factors:  medication ? ? ? ? ? ? ? ?OBJECTIVE:  ?*Unless otherwise noted by date, all objective measures were captured on initial evaluation.  ? ?DIAGNOSTIC FINDINGS:  ?MRI 01/18/2020 ?IMPRESSION: ?No acute abnormality. ?Degenerative tear of the anterior, superior right labrum. ? ?PATIENT SURVEYS:  ?FOTO 50%, predicted 70% ?  06/06/2021 41% ?  06/19/2021  55% limited ? ?COGNITION: ? Overall cognitive status: Within functional limits for tasks assessed   ?  ?SENSATION: ? Light touch: Appears intact ? ?POSTURE:  ?Forward head position ? ?PALPATION: ?TTP along the glute med/ and along the anterior aspect of the acetabulofemoral joint, along the proximal adductors, piriformis and proximal rectus femoris.  ? ?LE AROM/PROM: ? ?A/PROM Right ?04/18/2021 Left ?04/18/2021 Left  05/18/2021  ?Hip flexion [facility] * *60/110  ?Hip extension [facility] WFL *   ?Hip abduction [facility] WFL * *32 in supine  ?Hip adduction [facility] WFL * ? *WFL  ?Hip internal rotation     ?Hip external rotation     ?Knee flexion     ?Knee extension     ?Ankle dorsiflexion     ?Ankle plantarflexion     ?Ankle inversion     ?Ankle eversion     ?   (Blank rows = not tested, * pain during assessment) ? ?LE MMT: ? ?MMT Right ?04/18/2021 Left ?04/18/2021  Left  05/18/2021 Left ?06/19/2021  ?Hip flexion 4/5 4/-5* 3+/5 4/5  ?Hip extension 4-/5 3/5* 3+/5 4-/5  ?Hip abduction 4-/5 3+/5* 3+/5 4-/5  ?Hip adduction 4-/5 4-/5* 4/5 4/5  ?Hip internal rotation      ?Hip external rotation      ?Knee flexion      ?Knee extension      ?Ankle dorsiflexion      ?Ankle plantarflexion      ?Ankle inversion      ?Ankle eversion      ? (Blank rows = not tested, *pain during testing ) ? ?LOWER EXTREMITY SPECIAL TESTS:  ?N/A ? ?FUNCTIONAL TESTS:  ?5 times sit to stand: 18 seconds ?05/23/2021 - unable to perform ? ?GAIT: ?Assistive device utilized: RW ?Level of assistance: Complete Independence ?Comments: antalgic gait with decreaesd stance on the LLE, and stride on the RLE ? ? ?TODAY'S TREATMENT: ?[facility] Adult PT  Treatment:                                                DATE: 06/29/2021 ?Therapeutic Exercise: ?Seated hamstring stretch 2 x 30 sec ?Nu-step L6 x 5 min LE only ?Leg press 2 x 10, 40# bil ?Sidelying hip abduction 2 x 10 4# ?SLR 2 x 15 ?Manual Therapy: ?MTPR along the L glute med/ piriformis ?STW along the glute med ?LAD grade III LLE ?X friction massage along incision ?Sciatic nerve glides with hamstring stretch and ankle pumps ? ? ? ?[facility] Adult PT Treatment:                                                DATE: 06/26/2021 ?Therapeutic Exercise: ?Nu-step L5 x 2:30 min , L6 x 2:30 LE only ?Leg press L bil LE 2 x 10 20# ?Manual Therapy: ?MTPR along the L glute med/ piriformis ?STW along the glute med ?LAD grade III LLE ?Neuromuscular re-ed: ?Standing wall slides reaching up with RUE with stepping onto L leg to promote glute med  ?Gait training with SPC with CGA for safety  ?Therapeutic Activity: ?Going up/ down 6 inch steps x 5 with SPC - utilized distraction technique, with gait belt for safety ?Modalities: ?L hip MHP x 10 min in supine ? ? ?[facility] Adult PT Treatment:                                                DATE: 06/21/2021 ?Therapeutic Exercise: ?Nu-step L5 x 5 min  ?Step up/ down with  6 inch 2 x 10 with SPC and 1 HHA from  ?Sidelying hip abduction 2 x 15 ?Sit to stand 2 x 12 with with 10# Kettlebell ?Manual Therapy: ?LAD grade III LLE ?MTPR along the vastus lateralis x 3 on the L ?Neuromuscular re-ed: ?Gait training with pt's SPC 4 x 50 ft ?Modalities: ?L hip MHP x 10 min in supine ?Self Care: ?Reviewed walking SPC and proper technique ? ?PATIENT EDUCATION:  ?Education details: re-evaluation findings, POC, goals. reviewed HEP and updated today  ?Person educated: Patient ?Education method: Explanation ?Education comprehension: verbalized understanding ? ? ?HOME EXERCISE PROGRAM: ?Access  Code: [email or web address] ?URL: [email or web address] ?Date: 06/29/2021 ?Prepared by: Starr Lake ? ?Exercises ?-  Hooklying Clamshell with Resistance  - 1 x daily - 7 x weekly - 2 sets - 10 reps ?- Supine Heel Slides (Mirrored)  - 1 x daily - 7 x weekly - 2 sets - 10 reps ?- Supine March  - 1 x daily - 7 x weekly - 2 sets - 10 reps - 5 hold ?- Supine Quad Set  - 1 x daily - 7 x weekly - 2 sets - 10 reps ?- Modified Thomas Stretch (Mirrored)  - 2 x daily - 7 x weekly - 2 sets - 2 reps - 30 seconds hold ?- Lateral Weight Shift with Parallel Bars (BKA)  - 1 x daily - 7 x weekly - 1 sets - 1 min hold ?- Standing Hip Abduction with Counter Support  - 1 x daily - 7 x weekly - 1 sets - 10 reps ?- SLR  - 1 x daily - 7 x weekly - 3 sets - 10 reps - 1 hold ?- Supine Quadriceps Stretch with Strap on Table  - 1 x daily - 7 x weekly - 3 sets - 30 seconds hold ?- Seated Hamstring Stretch  - 1 x daily - 7 x weekly - 2 sets - 2 reps - 30 hold ? ? ? ? ?ASSESSMENT: ? ?CLINICAL IMPRESSION: ?Mrs Broecker reports she is still having pain in the lateral aspect of the hip. Continued STW along the lateral aspect of the hip and LAD mobs. Continued working on gross hip strengthening with focus on loading to maximize her strength with hip extension/ abduction. Continued MHP end of session to calm down soreness. She is making good progress with physical therapy despite continued soreness/ tightness in the L hip. She would benefit from continued physical therapy to decrease L hip pain, increase strength, improve gait / stability with LRAD and maximize her function by addressing the deficits listed.  ? ?IMPAIRMENTS/ DEFICITS: Objective impairments include Abnormal gait, decreased activity tolerance, decreased balance, decreased endurance, difficulty walking, decreased strength, increased edema, increased muscle spasms, postural dysfunction, and pain. ? ?REHAB POTENTIAL: Good ? ?CLINICAL DECISION MAKING: Stable/uncomplicated ? ?EVALUATION COMPLEXITY: Low ? ? ?GOALS: ? ?SHORT TERM GOALS: ? ?STG Name Target Date Goal status  ?1 Pt to be IND with initial HEP to  progress PT  ?Baseline:  07/27/2021 ? Met  ?06/19/2021  ?2 Pt to be able to walk and stand for >/= 15 min for progression of endurance 07/27/2021 ? ? Met ?06/19/2021  ?3 Increase L hip flexoin to >/= 90 degrees actively with </= 5/10 pa

## 2021-07-03 ENCOUNTER — Encounter: Payer: Self-pay | Admitting: Physical Therapy

## 2021-07-03 ENCOUNTER — Ambulatory Visit: Payer: Medicare Other | Attending: Adult Reconstructive Orthopaedic Surgery | Admitting: Physical Therapy

## 2021-07-03 DIAGNOSIS — R2689 Other abnormalities of gait and mobility: Secondary | ICD-10-CM | POA: Insufficient documentation

## 2021-07-03 DIAGNOSIS — M6281 Muscle weakness (generalized): Secondary | ICD-10-CM | POA: Diagnosis present

## 2021-07-03 DIAGNOSIS — M25552 Pain in left hip: Secondary | ICD-10-CM | POA: Diagnosis present

## 2021-07-03 NOTE — Therapy (Signed)
?OUTPATIENT PHYSICAL THERAPY TREATMENT NOTE  ? ? ? ?  ? ?Patient Name: Christine Roberson ?MRN: [id number] ?DOB:1965-05-12, 56 y.o., female ?Today's Date: 07/03/2021 ? ?PCP: Daleen Snook, NP ?REFERRING PROVIDER: Daleen Snook, NP ? ? PT End of Session - 07/03/21 1431   ? ? Visit Number 14   ? Number of Visits 25   ? Date for PT Re-Evaluation 08/24/21   ? Progress Note Due on Visit 20   ? PT Start Time 1350   ? PT Stop Time 0630   ? PT Time Calculation (min) 55 min   ? Equipment Utilized During Treatment Gait belt   ? Activity Tolerance Patient tolerated treatment well   ? Behavior During Therapy [facility] for tasks assessed/performed   ? ?  ?  ? ?  ? ? ? ? ? ? ? ? ? ? ? ? ? ?Past Medical History:  ?Diagnosis Date  ? Arthritis   ? Chronic back pain   ? DDD (degenerative disc disease), lumbar   ? Headache   ? Hypertension   ? MVP (mitral valve prolapse)   ? ?Past Surgical History:  ?Procedure Laterality Date  ? ABDOMINAL HYSTERECTOMY    ? CLOSED MANIPULATION SHOULDER WITH STERIOD INJECTION Right 03/05/2017  ? Procedure: CLOSED MANIPULATION SHOULDER WITH STEROID INJECTION;  Surgeon: Renette Butters, MD;  Location: [place];  Service: Orthopedics;  Laterality: Right;  ? JOINT REPLACEMENT Right   ? TKR  ? KNEE ARTHROSCOPY Bilateral   ? SPINAL CORD STIMULATOR IMPLANT    ? SPINAL CORD STIMULATOR REMOVAL    ? TONSILLECTOMY    ? TOTAL HIP ARTHROPLASTY Right   ? ?There are no problems to display for this patient. ? ? 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 ?THERAPY DIAG:  ?Pain in left hip ? ?Muscle weakness (generalized) ? ?Other abnormalities of gait and mobility ? ?PERTINENT HISTORY: R THA , L THA 2/14 ? ?PRECAUTIONS: Posterior hip precautions ? ?SUBJECTIVE: "I am still hurting  ? ?PAIN:  ?Are you having pain? Yes: NPRS scale: 6/10 ?Pain location: outside of the hip ?Pain description: standing/ walking  ?Aggravating factors: quick movements ?Relieving factors:  medication ? ? ? ? ? ? ? ?OBJECTIVE:  ?*Unless otherwise noted by date, all objective measures were captured on initial evaluation.  ? ?DIAGNOSTIC FINDINGS:  ?MRI 01/18/2020 ?IMPRESSION: ?No acute abnormality. ?Degenerative tear of the anterior, superior right labrum. ? ?PATIENT SURVEYS:  ?FOTO 50%, predicted 70% ?  06/06/2021 41% ?  06/19/2021  55% limited ? ?COGNITION: ? Overall cognitive status: Within functional limits for tasks assessed   ?  ?SENSATION: ? Light touch: Appears intact ? ?POSTURE:  ?Forward head position ? ?PALPATION: ?TTP along the glute med/ and along the anterior aspect of the acetabulofemoral joint, along the proximal adductors, piriformis and proximal rectus femoris.  ? ?LE AROM/PROM: ? ?A/PROM Right ?04/18/2021 Left ?04/18/2021 Left  05/18/2021  ?Hip flexion [facility] * *60/110  ?Hip extension [facility] WFL *   ?Hip abduction [facility] WFL * *32 in supine  ?Hip adduction [facility] WFL * ? *WFL  ?Hip internal rotation     ?Hip external rotation     ?Knee flexion     ?Knee extension     ?Ankle dorsiflexion     ?Ankle plantarflexion     ?Ankle inversion     ?Ankle eversion     ?   (Blank rows = not tested, * pain during assessment) ? ?LE MMT: ? ?MMT Right ?04/18/2021  Left ?04/18/2021 Left  05/18/2021 Left ?06/19/2021  ?Hip flexion 4/5 4/-5* 3+/5 4/5  ?Hip extension 4-/5 3/5* 3+/5 4-/5  ?Hip abduction 4-/5 3+/5* 3+/5 4-/5  ?Hip adduction 4-/5 4-/5* 4/5 4/5  ?Hip internal rotation      ?Hip external rotation      ?Knee flexion      ?Knee extension      ?Ankle dorsiflexion      ?Ankle plantarflexion      ?Ankle inversion      ?Ankle eversion      ? (Blank rows = not tested, *pain during testing ) ? ?LOWER EXTREMITY SPECIAL TESTS:  ?N/A ? ?FUNCTIONAL TESTS:  ?5 times sit to stand: 18 seconds ?05/23/2021 - unable to perform ? ?GAIT: ?Assistive device utilized: RW ?Level of assistance: Complete Independence ?Comments: antalgic gait with decreaesd stance on the LLE, and stride on the RLE ? ? ?TODAY'S TREATMENT: ? ? ?[place] Adult PT  Treatment:                                                DATE: 07/03/2021 ? ?Therapeutic Exercise: ?Sit to stand with RTB around knees 2 x 12 from elevated table ?Standing hip flexor stretch 2 x 30 sec ?Seated marching with RTB around knees 2 x 15 LLE only ?Manual Therapy: ?MTPR along the L vastus lateralis  ?Tack and stretch of the bicep femoris ?Neuromuscular re-ed: ?Rhomberg  4 x 30 sec on airex pad ?Rhomberg on airex 2 x 30 sec with eyes closed ?Marching on airex pad alternating L/R 2 x 10  with bil UE assist on counter using finger tips, progrssed to 1 hand 2 x 20 ?Therapeutic Activity: ?6 min walk test - 434f ?Modalities: ?L hip MHP x 10 min in supine ? ? ?[facility] Adult PT Treatment:                                                DATE: 06/29/2021 ?Therapeutic Exercise: ?Seated hamstring stretch 2 x 30 sec ?Nu-step L6 x 5 min LE only ?Leg press 2 x 10, 40# bil ?Sidelying hip abduction 2 x 10 4# ?SLR 2 x 15 ?Manual Therapy: ?MTPR along the L glute med/ piriformis ?STW along the glute med ?LAD grade III LLE ?X friction massage along incision ?Sciatic nerve glides with hamstring stretch and ankle pumps ? ? ? [facility]                                                [facility]: 06[facility] ?Therapeutic Exercise: ?Nu-step L5 x 2:30 min , L6 x 2:30 LE only ?Leg press L bil LE 2 x 10 20# ?Manual Therapy: ?MTPR along the L glute med/ piriformis ?STW along the glute med ?LAD grade III LLE ?Neuromuscular re-ed: ?Standing wall slides reaching up with RUE with stepping onto L leg to promote glute med  ?Gait training with SPC with CGA for safety  ?Therapeutic Activity: ?Going up/ down 6 inch steps x 5 with SPC - utilized distraction technique, with gait belt for safety ?Modalities: ?L hip MHP x 10 min  in supine ? ? ?PATIENT EDUCATION:  ?Education details: re-evaluation findings, POC, goals. reviewed HEP and updated today  ?Person educated: Patient ?Education method: Explanation ?Education comprehension: verbalized  understanding ? ? ?HOME EXERCISE PROGRAM: ?Access Code: [email or web address] ?URL: [email or web address] ?Date: 06/29/2021 ?Prepared by: Starr Lake ? ?Exercises ?- Hooklying Clamshell with Resistance  - 1 x daily - 7 x weekly - 2 sets - 10 reps ?- Supine Heel Slides (Mirrored)  - 1 x daily - 7 x weekly - 2 sets - 10 reps ?- Supine March  - 1 x daily - 7 x weekly - 2 sets - 10 reps - 5 hold ?- Supine Quad Set  - 1 x daily - 7 x weekly - 2 sets - 10 reps ?- Modified Thomas Stretch (Mirrored)  - 2 x daily - 7 x weekly - 2 sets - 2 reps - 30 seconds hold ?- Lateral Weight Shift with Parallel Bars (BKA)  - 1 x daily - 7 x weekly - 1 sets - 1 min hold ?- Standing Hip Abduction with Counter Support  - 1 x daily - 7 x weekly - 1 sets - 10 reps ?- SLR  - 1 x daily - 7 x weekly - 3 sets - 10 reps - 1 hold ?- Supine Quadriceps Stretch with Strap on Table  - 1 x daily - 7 x weekly - 3 sets - 30 seconds hold ?- Seated Hamstring Stretch  - 1 x daily - 7 x weekly - 2 sets - 2 reps - 30 hold ? ? ? ? ?ASSESSMENT: ? ?CLINICAL IMPRESSION: ?Pt arrives reporting pain at 5/10 located along the lateral aspect of the L hip. She was able to perform a 6 min walk test with 2  x 30 secs standing rest breaks. Continued working on gross hip strengthening which she does well with and additionally reports the pain seems to be calming down with exercise. Continued MHP end of session to calm down soreness following session.  ? ?IMPAIRMENTS/ DEFICITS: Objective impairments include Abnormal gait, decreased activity tolerance, decreased balance, decreased endurance, difficulty walking, decreased strength, increased edema, increased muscle spasms, postural dysfunction, and pain. ? ?REHAB POTENTIAL: Good ? ?CLINICAL DECISION MAKING: Stable/uncomplicated ? ?EVALUATION COMPLEXITY: Low ? ? ?GOALS: ? ?SHORT TERM GOALS: ? ?STG Name Target Date Goal status  ?1 Pt to be IND with initial HEP to progress PT  ?Baseline:  07/31/2021 ? Met  ?06/19/2021  ?2 Pt  to be able to walk and stand for >/= 15 min for progression of endurance 07/31/2021 ? ? Met ?06/19/2021  ?3 Increase L hip flexoin to >/= 90 degrees actively with </= 5/10 pain max for progression of hip mobility  07/31/2021 ? Met ?3/

## 2021-07-05 NOTE — Therapy (Signed)
?OUTPATIENT PHYSICAL THERAPY TREATMENT NOTE  ? ? ? ?  ? ?Patient Name: Christine Roberson ?MRN: [id number] ?DOB:January 30, 1966, 56 y.o., female ?Today's Date: 07/06/2021 ? ?PCP: Daleen Snook, NP ?REFERRING PROVIDER: Rosanne Ashing, MD ? ? PT End of Session - 07/06/21 1413   ? ? Visit Number 15   ? Number of Visits 25   ? Date for PT Re-Evaluation 08/24/21   ? PT Start Time 1415   ? PT Stop Time 6712   ? PT Time Calculation (min) 53 min   ? Activity Tolerance Patient tolerated treatment well   ? Behavior During Therapy [facility] for tasks assessed/performed   ? ?  ?  ? ?  ? ? ? ? ? ? ? ? ? ? ? ? ? ? ?Past Medical History:  ?Diagnosis Date  ? Arthritis   ? Chronic back pain   ? DDD (degenerative disc disease), lumbar   ? Headache   ? Hypertension   ? MVP (mitral valve prolapse)   ? ?Past Surgical History:  ?Procedure Laterality Date  ? ABDOMINAL HYSTERECTOMY    ? CLOSED MANIPULATION SHOULDER WITH STERIOD INJECTION Right 03/05/2017  ? Procedure: CLOSED MANIPULATION SHOULDER WITH STEROID INJECTION;  Surgeon: Renette Butters, MD;  Location: [place];  Service: Orthopedics;  Laterality: Right;  ? JOINT REPLACEMENT Right   ? TKR  ? KNEE ARTHROSCOPY Bilateral   ? SPINAL CORD STIMULATOR IMPLANT    ? SPINAL CORD STIMULATOR REMOVAL    ? TONSILLECTOMY    ? TOTAL HIP ARTHROPLASTY Right   ? ?There are no problems to display for this patient. ? ? 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 ?THERAPY DIAG:  ?Pain in left hip ? ?Muscle weakness (generalized) ? ?Other abnormalities of gait and mobility ? ?PERTINENT HISTORY: R THA , L THA 2/14 ? ?PRECAUTIONS: Posterior hip precautions ? ?SUBJECTIVE: " Today the pain is about at 5/10, it really isn't bad today." ? ?PAIN:  ?Are you having pain? Yes: NPRS scale: 5/10 ?Pain location: outside of the hip ?Pain description: numbing ?Aggravating factors: standing/ walking ?Relieving factors: sitting down. ? ? ? ? ? ? ? ? ?OBJECTIVE:  ?*Unless otherwise noted by  date, all objective measures were captured on initial evaluation.  ? ?DIAGNOSTIC FINDINGS:  ?MRI 01/18/2020 ?IMPRESSION: ?No acute abnormality. ?Degenerative tear of the anterior, superior right labrum. ? ?PATIENT SURVEYS:  ?FOTO 50%, predicted 70% ?  06/06/2021 41% ?  06/19/2021  55% limited ? ?COGNITION: ? Overall cognitive status: Within functional limits for tasks assessed   ?  ?SENSATION: ? Light touch: Appears intact ? ?POSTURE:  ?Forward head position ? ?PALPATION: ?TTP along the glute med/ and along the anterior aspect of the acetabulofemoral joint, along the proximal adductors, piriformis and proximal rectus femoris.  ? ?LE AROM/PROM: ? ?A/PROM Right ?04/18/2021 Left ?04/18/2021 Left  05/18/2021  ?Hip flexion [facility] * *60/110  ?Hip extension [facility] WFL *   ?Hip abduction [facility] WFL * *32 in supine  ?Hip adduction [facility] WFL * ? *WFL  ?Hip internal rotation     ?Hip external rotation     ?Knee flexion     ?Knee extension     ?Ankle dorsiflexion     ?Ankle plantarflexion     ?Ankle inversion     ?Ankle eversion     ?   (Blank rows = not tested, * pain during assessment) ? ?LE MMT: ? ?MMT Right ?04/18/2021 Left ?04/18/2021 Left  05/18/2021 Left ?06/19/2021  ?Hip  flexion 4/5 4/-5* 3+/5 4/5  ?Hip extension 4-/5 3/5* 3+/5 4-/5  ?Hip abduction 4-/5 3+/5* 3+/5 4-/5  ?Hip adduction 4-/5 4-/5* 4/5 4/5  ?Hip internal rotation      ?Hip external rotation      ?Knee flexion      ?Knee extension      ?Ankle dorsiflexion      ?Ankle plantarflexion      ?Ankle inversion      ?Ankle eversion      ? (Blank rows = not tested, *pain during testing ) ? ?LOWER EXTREMITY SPECIAL TESTS:  ?N/A ? ?FUNCTIONAL TESTS:  ?5 times sit to stand: 18 seconds ?05/23/2021 - unable to perform ? ?GAIT: ?Assistive device utilized: RW ?Level of assistance: Complete Independence ?Comments: antalgic gait with decreaesd stance on the LLE, and stride on the RLE ? ? ?TODAY'S TREATMENT: ? ?[place] Adult PT Treatment:                                                DATE:  07/05/2021 ?Therapeutic Exercise: ?Nu-step L7 x 5 min  ?LAQ 2 x 15 with 5# ?Seated march 2 x 15# ?Sidelying hip abduction 2 x 12 3# ?Manual Therapy: ?MTPR along the glute med on the L ?DTM along the L glute med ?Neuromuscular re-ed: ?Corner balance: rhomberg 3 x 30 sec with EC, modified 4 x 30 sec with EC ?Modalities: ?MHP in supine 10 x min ? ? ?[place] Adult PT Treatment:                                                DATE: 07/03/2021 ?Therapeutic Exercise: ?Sit to stand with RTB around knees 2 x 12 from elevated table ?Standing hip flexor stretch 2 x 30 sec ?Seated marching with RTB around knees 2 x 15 LLE only ?Manual Therapy: ?MTPR along the L vastus lateralis  ?Tack and stretch of the bicep femoris ?Neuromuscular re-ed: ?Rhomberg  4 x 30 sec on airex pad ?Rhomberg on airex 2 x 30 sec with eyes closed ?Marching on airex pad alternating L/R 2 x 10  with bil UE assist on counter using finger tips, progrssed to 1 hand 2 x 20 ?Therapeutic Activity: ?6 min walk test - 449f ?Modalities: ?L hip MHP x 10 min in supine ? ? ?[facility] Adult PT Treatment:                                                DATE: 06/29/2021 ?Therapeutic Exercise: ?Seated hamstring stretch 2 x 30 sec ?Nu-step L6 x 5 min LE only ?Leg press 2 x 10, 40# bil ?Sidelying hip abduction 2 x 10 4# ?SLR 2 x 15 ?Manual Therapy: ?MTPR along the L glute med/ piriformis ?STW along the glute med ?LAD grade III LLE ?X friction massage along incision ?Sciatic nerve glides with hamstring stretch and ankle pumps ? ? ? ?PATIENT EDUCATION:  ?Education details: re-evaluation findings, POC, goals. reviewed HEP and updated today  ?Person educated: Patient ?Education method: Explanation ?Education comprehension: verbalized understanding ? ? ?HOME EXERCISE PROGRAM: ?Access Code: [email or web address]?URL: [email or web address] ?Date: 06/29/2021 ?Prepared  by: Starr Lake ? ?Exercises ?- Hooklying Clamshell with Resistance  - 1 x daily - 7 x weekly - 2 sets - 10 reps ?- Supine  Heel Slides (Mirrored)  - 1 x daily - 7 x weekly - 2 sets - 10 reps ?- Supine March  - 1 x daily - 7 x weekly - 2 sets - 10 reps - 5 hold ?- Supine Quad Set  - 1 x daily - 7 x weekly - 2 sets - 10 reps ?- Modified Thomas Stretch (Mirrored)  - 2 x daily - 7 x weekly - 2 sets - 2 reps - 30 seconds hold ?- Lateral Weight Shift with Parallel Bars (BKA)  - 1 x daily - 7 x weekly - 1 sets - 1 min hold ?- Standing Hip Abduction with Counter Support  - 1 x daily - 7 x weekly - 1 sets - 10 reps ?- SLR  - 1 x daily - 7 x weekly - 3 sets - 10 reps - 1 hold ?- Supine Quadriceps Stretch with Strap on Table  - 1 x daily - 7 x weekly - 3 sets - 30 seconds hold ?- Seated Hamstring Stretch  - 1 x daily - 7 x weekly - 2 sets - 2 reps - 30 hold ? ? ? ? ?ASSESSMENT: ? ?CLINICAL IMPRESSION: ?Mrs Sonnen is making good progress with physical therapy, and reports today at 5/10 and that she feels she is doing better. She continues to demonstrate increased soreness located along the L glute med/ min and fatigues quickly with exercise. She was able to perform all exercises well and progressed her balance to corner balance at home. Pt sees the MD next week.  ? ?IMPAIRMENTS/ DEFICITS: Objective impairments include Abnormal gait, decreased activity tolerance, decreased balance, decreased endurance, difficulty walking, decreased strength, increased edema, increased muscle spasms, postural dysfunction, and pain. ? ?REHAB POTENTIAL: Good ? ?CLINICAL DECISION MAKING: Stable/uncomplicated ? ?EVALUATION COMPLEXITY: Low ? ? ?GOALS: ? ?SHORT TERM GOALS: ? ?STG Name Target Date Goal status  ?1 Pt to be IND with initial HEP to progress PT  ?Baseline:  08/03/2021 ? Met  ?06/19/2021  ?2 Pt to be able to walk and stand for >/= 15 min for progression of endurance 08/03/2021 ? ? Met ?06/19/2021  ?3 Increase L hip flexoin to >/= 90 degrees actively with </= 5/10 pain max for progression of hip mobility  08/03/2021 ? Met ?06/19/2021  ? ?LONG TERM GOALS:  ? ?LTG Name  Target Date Goal status  ?1 Increase L hip gross strength to >/= 4/5 to promote hip stability and maximize safety  ?Baseline: 08/31/2021 ? Ongoing ?06/19/2021  ?2 Increase L hip AROM WFL compared bil to assist with

## 2021-07-06 ENCOUNTER — Ambulatory Visit: Payer: Medicare Other | Admitting: Physical Therapy

## 2021-07-06 ENCOUNTER — Encounter: Payer: Self-pay | Admitting: Physical Therapy

## 2021-07-06 DIAGNOSIS — R2689 Other abnormalities of gait and mobility: Secondary | ICD-10-CM

## 2021-07-06 DIAGNOSIS — M25552 Pain in left hip: Secondary | ICD-10-CM

## 2021-07-06 DIAGNOSIS — M6281 Muscle weakness (generalized): Secondary | ICD-10-CM

## 2021-07-13 ENCOUNTER — Ambulatory Visit: Payer: Medicare Other | Admitting: Physical Therapy

## 2021-07-13 DIAGNOSIS — M25552 Pain in left hip: Secondary | ICD-10-CM | POA: Diagnosis not present

## 2021-07-13 DIAGNOSIS — M6281 Muscle weakness (generalized): Secondary | ICD-10-CM

## 2021-07-13 NOTE — Therapy (Signed)
?OUTPATIENT PHYSICAL THERAPY TREATMENT NOTE  ? ? ? ?  ? ?Patient Name: Christine Roberson ?MRN: [id number] ?DOB:05-Mar-1966, 56 y.o., female ?Today's Date: 07/13/2021 ? ?PCP: Daleen Snook, NP ?REFERRING PROVIDER: Rosanne Ashing, MD ? ? PT End of Session - 07/13/21 1507   ? ? Visit Number 16   ? Number of Visits 25   ? Date for PT Re-Evaluation 08/24/21   ? Progress Note Due on Visit 20   ? PT Start Time 1505   ? PT Stop Time [id number]   ? PT Time Calculation (min) 40 min   ? Activity Tolerance Patient tolerated treatment well   ? Behavior During Therapy [facility] for tasks assessed/performed   ? ?  ?  ? ?  ? ? ? ? ? ? ? ? ? ? ? ? ? ? ? ?Past Medical History:  ?Diagnosis Date  ? Arthritis   ? Chronic back pain   ? DDD (degenerative disc disease), lumbar   ? Headache   ? Hypertension   ? MVP (mitral valve prolapse)   ? ?Past Surgical History:  ?Procedure Laterality Date  ? ABDOMINAL HYSTERECTOMY    ? CLOSED MANIPULATION SHOULDER WITH STERIOD INJECTION Right 03/05/2017  ? Procedure: CLOSED MANIPULATION SHOULDER WITH STEROID INJECTION;  Surgeon: Renette Butters, MD;  Location: [place];  Service: Orthopedics;  Laterality: Right;  ? JOINT REPLACEMENT Right   ? TKR  ? KNEE ARTHROSCOPY Bilateral   ? SPINAL CORD STIMULATOR IMPLANT    ? SPINAL CORD STIMULATOR REMOVAL    ? TONSILLECTOMY    ? TOTAL HIP ARTHROPLASTY Right   ? ?There are no problems to display for this patient. ? ? 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 ?THERAPY DIAG:  ?Pain in left hip ? ?Muscle weakness (generalized) ? ?PERTINENT HISTORY: R THA , L THA 2/14 ? ?PRECAUTIONS: Posterior hip precautions ? ?SUBJECTIVE: " I am still having a good amount of soreness in the hip today its a 6/10 and it feels just raw." ? ?PAIN:  ?Are you having pain? Yes: NPRS scale: 6/10 ?Pain location: outside of the hip ?Pain description: numbing ?Aggravating factors: standing/ walking ?Relieving factors: sitting down. ? ? ? ? ? ? ? ? ?OBJECTIVE:   ?*Unless otherwise noted by date, all objective measures were captured on initial evaluation.  ? ?DIAGNOSTIC FINDINGS:  ?MRI 01/18/2020 ?IMPRESSION: ?No acute abnormality. ?Degenerative tear of the anterior, superior right labrum. ? ?PATIENT SURVEYS:  ?FOTO 50%, predicted 70% ?  06/06/2021 41% ?  06/19/2021  55% limited ? ?COGNITION: ? Overall cognitive status: Within functional limits for tasks assessed   ?  ?SENSATION: ? Light touch: Appears intact ? ?POSTURE:  ?Forward head position ? ?PALPATION: ?TTP along the glute med/ and along the anterior aspect of the acetabulofemoral joint, along the proximal adductors, piriformis and proximal rectus femoris.  ? ?LE AROM/PROM: ? ?A/PROM Right ?04/18/2021 Left ?04/18/2021 Left  05/18/2021  ?Hip flexion [facility] * *60/110  ?Hip extension [facility] WFL *   ?Hip abduction [facility] WFL * *32 in supine  ?Hip adduction [facility] WFL * ? *WFL  ?Hip internal rotation     ?Hip external rotation     ?Knee flexion     ?Knee extension     ?Ankle dorsiflexion     ?Ankle plantarflexion     ?Ankle inversion     ?Ankle eversion     ?   (Blank rows = not tested, * pain during assessment) ? ?LE MMT: ? ?  MMT Right ?04/18/2021 Left ?04/18/2021 Left  05/18/2021 Left ?06/19/2021  ?Hip flexion 4/5 4/-5* 3+/5 4/5  ?Hip extension 4-/5 3/5* 3+/5 4-/5  ?Hip abduction 4-/5 3+/5* 3+/5 4-/5  ?Hip adduction 4-/5 4-/5* 4/5 4/5  ?Hip internal rotation      ?Hip external rotation      ?Knee flexion      ?Knee extension      ?Ankle dorsiflexion      ?Ankle plantarflexion      ?Ankle inversion      ?Ankle eversion      ? (Blank rows = not tested, *pain during testing ) ? ?LOWER EXTREMITY SPECIAL TESTS:  ?N/A ? ?FUNCTIONAL TESTS:  ?5 times sit to stand: 18 seconds ?05/23/2021 - unable to perform ? ?GAIT: ?Assistive device utilized: RW ?Level of assistance: Complete Independence ?Comments: antalgic gait with decreaesd stance on the LLE, and stride on the RLE ? ? ?TODAY'S TREATMENT: ? ?[place] Adult PT Treatment:                                                 DATE: 07/13/2021 ?Therapeutic Exercise: ?Nu-step L7 x 5 min LE only ?Glute med stretching in R sidelying ?Manual Therapy: ?X friction massage along the incision ?MTPR along the glute med x 3 ?Desensitization with soft material x 4 min , progressed to course material x 4 min  ?Modalities: ?Iontophoresis 4mg /ml 6 hour patch  ? ? ?[facility] Adult PT Treatment:                                                DATE: 07/05/2021 ?Therapeutic Exercise: ?Nu-step L7 x 5 min  ?LAQ 2 x 15 with 5# ?Seated march 2 x 15# ?Sidelying hip abduction 2 x 12 3# ?Manual Therapy: ?MTPR along the glute med on the L ?DTM along the L glute med ?Neuromuscular re-ed: ?Corner balance: rhomberg 3 x 30 sec with EC, modified 4 x 30 sec with EC ?Modalities: ?MHP in supine 10 x min ? ? ?[place] Adult PT Treatment:                                                DATE: 07/03/2021 ?Therapeutic Exercise: ?Sit to stand with RTB around knees 2 x 12 from elevated table ?Standing hip flexor stretch 2 x 30 sec ?Seated marching with RTB around knees 2 x 15 LLE only ?Manual Therapy: ?MTPR along the L vastus lateralis  ?Tack and stretch of the bicep femoris ?Neuromuscular re-ed: ?Rhomberg  4 x 30 sec on airex pad ?Rhomberg on airex 2 x 30 sec with eyes closed ?Marching on airex pad alternating L/R 2 x 10  with bil UE assist on counter using finger tips, progrssed to 1 hand 2 x 20 ?Therapeutic Activity: ?6 min walk test - 446ft ?Modalities: ?L hip MHP x 10 min in supine ? ? ?[facility] Adult PT Treatment:  DATE: 06/29/2021 ?Therapeutic Exercise: ?Seated hamstring stretch 2 x 30 sec ?Nu-step L6 x 5 min LE only ?Leg press 2 x 10, 40# bil ?Sidelying hip abduction 2 x 10 4# ?SLR 2 x 15 ?Manual Therapy: ?MTPR along the L glute med/ piriformis ?STW along the glute med ?LAD grade III LLE ?X friction massage along incision ?Sciatic nerve glides with hamstring stretch and ankle pumps ? ? ? ?PATIENT EDUCATION:  ?Education details:  re-evaluation findings, POC, goals. reviewed HEP and updated today  ?Person educated: Patient ?Education method: Explanation ?Education comprehension: verbalized understanding ? ? ?HOME EXERCISE PROGRAM: ?Access Code: [email or web address] ?URL: [email or web address] ?Date: 06/29/2021 ?Prepared by: Starr Lake ? ?Exercises ?- Hooklying Clamshell with Resistance  - 1 x daily - 7 x weekly - 2 sets - 10 reps ?- Supine Heel Slides (Mirrored)  - 1 x daily - 7 x weekly - 2 sets - 10 reps ?- Supine March  - 1 x daily - 7 x weekly - 2 sets - 10 reps - 5 hold ?- Supine Quad Set  - 1 x daily - 7 x weekly - 2 sets - 10 reps ?- Modified Thomas Stretch (Mirrored)  - 2 x daily - 7 x weekly - 2 sets - 2 reps - 30 seconds hold ?- Lateral Weight Shift with Parallel Bars (BKA)  - 1 x daily - 7 x weekly - 1 sets - 1 min hold ?- Standing Hip Abduction with Counter Support  - 1 x daily - 7 x weekly - 1 sets - 10 reps ?- SLR  - 1 x daily - 7 x weekly - 3 sets - 10 reps - 1 hold ?- Supine Quadriceps Stretch with Strap on Table  - 1 x daily - 7 x weekly - 3 sets - 30 seconds hold ?- Seated Hamstring Stretch  - 1 x daily - 7 x weekly - 2 sets - 2 reps - 30 hold ? ? ? ? ?ASSESSMENT: ? ?CLINICAL IMPRESSION: ?Mrs Blaisdell reports to PT with report of continued L lateral hip pain located along the incision site rated today at 6/10 today . Due to continued pain and aggravation located at the surgical site focused session on manual techniques to promote fibroplasitc proliferation and desensitization techniques as well as DTM/ MTPR along the glute med. MD signed POC so iontophoresis was explained and administered to the L glute med. End of session she noted pain dropped from 6 to 4/10. ? ?IMPAIRMENTS/ DEFICITS: Objective impairments include Abnormal gait, decreased activity tolerance, decreased balance, decreased endurance, difficulty walking, decreased strength, increased edema, increased muscle spasms, postural dysfunction, and  pain. ? ?REHAB POTENTIAL: Good ? ?CLINICAL DECISION MAKING: Stable/uncomplicated ? ?EVALUATION COMPLEXITY: Low ? ? ?GOALS: ? ?SHORT TERM GOALS: ? ?STG Name Target Date Goal status  ?1 Pt to be IND with initial HEP to

## 2021-07-13 NOTE — Patient Instructions (Signed)

## 2021-07-18 ENCOUNTER — Ambulatory Visit: Payer: Medicare Other | Admitting: Physical Therapy

## 2021-07-18 NOTE — Therapy (Incomplete)
?OUTPATIENT PHYSICAL THERAPY TREATMENT NOTE  ? ? ? ?  ? ?Patient Name: Christine Roberson ?MRN: [id number] ?DOB:[birth year], 56 y.o., female ?Today's Date: 07/18/2021 ? ?PCP: Daleen Snook, NP ?REFERRING PROVIDER: Daleen Snook, NP ? ? ? ? ? ? ? ? ? ? ? ? ? ? ? ? ? ?Past Medical History:  ?Diagnosis Date  ? Arthritis   ? Chronic back pain   ? DDD (degenerative disc disease), lumbar   ? Headache   ? Hypertension   ? MVP (mitral valve prolapse)   ? ?Past Surgical History:  ?Procedure Laterality Date  ? ABDOMINAL HYSTERECTOMY    ? CLOSED MANIPULATION SHOULDER WITH STERIOD INJECTION Right 03/05/2017  ? Procedure: CLOSED MANIPULATION SHOULDER WITH STEROID INJECTION;  Surgeon: Renette Butters, MD;  Location: [place];  Service: Orthopedics;  Laterality: Right;  ? JOINT REPLACEMENT Right   ? TKR  ? KNEE ARTHROSCOPY Bilateral   ? SPINAL CORD STIMULATOR IMPLANT    ? SPINAL CORD STIMULATOR REMOVAL    ? TONSILLECTOMY    ? TOTAL HIP ARTHROPLASTY Right   ? ?There are no problems to display for this patient. ? ? 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 ?THERAPY DIAG:  ?No diagnosis found. ? ?PERTINENT HISTORY: R THA , L THA 2/14 ? ?PRECAUTIONS: Posterior hip precautions ? ?SUBJECTIVE: *** ? ?PAIN:  ?Are you having pain? Yes: NPRS scale: 6/10 ?Pain location: outside of the hip ?Pain description: numbing ?Aggravating factors: standing/ walking ?Relieving factors: sitting down. ? ? ? ? ? ? ? ? ?OBJECTIVE:  ?*Unless otherwise noted by date, all objective measures were captured on initial evaluation.  ? ?DIAGNOSTIC FINDINGS:  ?MRI 01/18/2020 ?IMPRESSION: ?No acute abnormality. ?Degenerative tear of the anterior, superior right labrum. ? ?PATIENT SURVEYS:  ?FOTO 50%, predicted 70% ?  06/06/2021 41% ?  06/19/2021  55% limited ? ?COGNITION: ? Overall cognitive status: Within functional limits for tasks assessed   ?  ?SENSATION: ? Light touch: Appears intact ? ?POSTURE:  ?Forward head  position ? ?PALPATION: ?TTP along the glute med/ and along the anterior aspect of the acetabulofemoral joint, along the proximal adductors, piriformis and proximal rectus femoris.  ? ?LE AROM/PROM: ? ?A/PROM Right ?04/18/2021 Left ?04/18/2021 Left  05/18/2021  ?Hip flexion [facility] * *60/110  ?Hip extension [facility] WFL *   ?Hip abduction [facility] WFL * *32 in supine  ?Hip adduction [facility] WFL * ? *WFL  ?Hip internal rotation     ?Hip external rotation     ?Knee flexion     ?Knee extension     ?Ankle dorsiflexion     ?Ankle plantarflexion     ?Ankle inversion     ?Ankle eversion     ?   (Blank rows = not tested, * pain during assessment) ? ?LE MMT: ? ?MMT Right ?04/18/2021 Left ?04/18/2021 Left  05/18/2021 Left ?06/19/2021  ?Hip flexion 4/5 4/-5* 3+/5 4/5  ?Hip extension 4-/5 3/5* 3+/5 4-/5  ?Hip abduction 4-/5 3+/5* 3+/5 4-/5  ?Hip adduction 4-/5 4-/5* 4/5 4/5  ?Hip internal rotation      ?Hip external rotation      ?Knee flexion      ?Knee extension      ?Ankle dorsiflexion      ?Ankle plantarflexion      ?Ankle inversion      ?Ankle eversion      ? (Blank rows = not tested, *pain during testing ) ? ?LOWER EXTREMITY SPECIAL TESTS:  ?N/A ? ?FUNCTIONAL  TESTS:  ?5 times sit to stand: 18 seconds ?05/23/2021 - unable to perform ? ?GAIT: ?Assistive device utilized: RW ?Level of assistance: Complete Independence ?Comments: antalgic gait with decreaesd stance on the LLE, and stride on the RLE ? ? ?TODAY'S TREATMENT: ? ?[place] Adult PT Treatment:                                                DATE: 07/18/2021 ?Therapeutic Exercise: ?*** ?Manual Therapy: ?*** ?Neuromuscular re-ed: ?*** ?Therapeutic Activity: ?*** ?Modalities: ?*** ?Self Care: ?*** ? ?[facility] Adult PT Treatment:                                                DATE: 07/13/2021 ?Therapeutic Exercise: ?Nu-step L7 x 5 min LE only ?Glute med stretching in R sidelying ?Manual Therapy: ?X friction massage along the incision ?MTPR along the glute med x 3 ?Desensitization with soft material x 4  min , progressed to course material x 4 min  ?Modalities: ?Iontophoresis 27m/ml 6 hour patch  ? ? ?[facility] Adult PT Treatment:                                                DATE: 07/05/2021 ?Therapeutic Exercise: ?Nu-step L7 x 5 min  ?LAQ 2 x 15 with 5# ?Seated march 2 x 15# ?Sidelying hip abduction 2 x 12 3# ?Manual Therapy: ?MTPR along the glute med on the L ?DTM along the L glute med ?Neuromuscular re-ed: ?Corner balance: rhomberg 3 x 30 sec with EC, modified 4 x 30 sec with EC ?Modalities: ?MHP in supine 10 x min ? ? ? ?PATIENT EDUCATION:  ?Education details: re-evaluation findings, POC, goals. reviewed HEP and updated today  ?Person educated: Patient ?Education method: Explanation ?Education comprehension: verbalized understanding ? ? ?HOME EXERCISE PROGRAM: ?Access Code: [email or web address]?URL: [email or web address] ?Date: 06/29/2021 ?Prepared by: KStarr Lake? ?Exercises ?- Hooklying Clamshell with Resistance  - 1 x daily - 7 x weekly - 2 sets - 10 reps ?- Supine Heel Slides (Mirrored)  - 1 x daily - 7 x weekly - 2 sets - 10 reps ?- Supine March  - 1 x daily - 7 x weekly - 2 sets - 10 reps - 5 hold ?- Supine Quad Set  - 1 x daily - 7 x weekly - 2 sets - 10 reps ?- Modified Thomas Stretch (Mirrored)  - 2 x daily - 7 x weekly - 2 sets - 2 reps - 30 seconds hold ?- Lateral Weight Shift with Parallel Bars (BKA)  - 1 x daily - 7 x weekly - 1 sets - 1 min hold ?- Standing Hip Abduction with Counter Support  - 1 x daily - 7 x weekly - 1 sets - 10 reps ?- SLR  - 1 x daily - 7 x weekly - 3 sets - 10 reps - 1 hold ?- Supine Quadriceps Stretch with Strap on Table  - 1 x daily - 7 x weekly - 3 sets - 30 seconds hold ?- Seated Hamstring Stretch  - 1 x daily - 7 x weekly - 2 sets -  2 reps - 30 hold ? ? ? ? ?ASSESSMENT: ? ?CLINICAL IMPRESSION: ?*** ? ?IMPAIRMENTS/ DEFICITS: Objective impairments include Abnormal gait, decreased activity tolerance, decreased balance, decreased endurance, difficulty walking, decreased  strength, increased edema, increased muscle spasms, postural dysfunction, and pain. ? ?REHAB POTENTIAL: Good ? ?CLINICAL DECISION MAKING: Stable/uncomplicated ? ?EVALUATION COMPLEXITY: Low ? ? ?GOALS: ? ?SHORT TERM GOALS: ? ?STG Name Target Date Goal status  ?1 Pt to be IND with initial HEP to progress PT  ?Baseline:  07/04/2021 ? Met  ?06/19/2021  ?2 Pt to be able to walk and stand for >/= 15 min for progression of endurance 07/04/2021 ? ? Met ?06/19/2021  ?3 Increase L hip flexoin to >/= 90 degrees actively with </= 5/10 pain max for progression of hip mobility  07/04/2021 ? Met ?06/19/2021  ? ?LONG TERM GOALS:  ? ?LTG Name Target Date Goal status  ?1 Increase L hip gross strength to >/= 4/5 to promote hip stability and maximize safety  ?Baseline: 07/18/2021 ? Ongoing ?06/19/2021  ?2 Increase L hip AROM WFL compared bil to assist with general mobility required for ADLS with </= max 1/10 pain ?Baseline: 07/18/2021 Ongoing  ?06/19/2021  ?3 Pt to be able to stand/ walk for >/= 30 min with LRAD demonstrating heel strike/ toe off pattern with min deviation for functional endurance for in home amb and short community distances ?Baseline: 07/18/2021 Ongoing 06/19/2021  ?4 Pt to increase FOTO score to >/= 70% to demo improvement in function ?Baseline: 07/18/2021 Ongoing ?06/19/2021  ?4 Pt to be able to perform 5 x sit to stand </= 15 seconds to demo improvement in function ?Baseline: at eval 18 seconds, at re-eval unable to perform 07/18/2021 Ongoing ?06/19/2021  ?5 Pt to be IND with all HEP to maintain and progress curreht LOF IND 07/18/2021 Ongoing ?06/19/2021  ? ?PLAN: ?PT FREQUENCY: 2x/week ? ?PT DURATION: 6 weeks ? ?PLANNED INTERVENTIONS: Therapeutic exercises, Therapeutic activity, Neuro Muscular re-education, Balance training, Gait training, Patient/Family education, Joint mobilization, Stair training, DME instructions, Dry Needling, Electrical stimulation, Cryotherapy, Moist heat, Taping, Ionotophoresis 52m/ml Dexamethasone, and  Manual therapy ? ?PLAN FOR NEXT SESSION: Review/ update HEP PRN. Posterior hip precautions, Hip ROM, gross strengthening,  Stair negotiation.  Continue with gentle hip stretches and strengthening per patient's toleranc

## 2021-07-19 NOTE — Therapy (Signed)
?OUTPATIENT PHYSICAL THERAPY TREATMENT NOTE  ? ? ? ?  ? ?Patient Name: Christine Roberson ?MRN: [id number] ?DOB:1965/12/19, 56 y.o., female ?Today's Date: 07/20/2021 ? ?PCP: Daleen Snook, NP ?REFERRING PROVIDER: Rosanne Ashing, MD ? ? PT End of Session - 07/20/21 1501   ? ? Visit Number 17   ? Number of Visits 25   ? Date for PT Re-Evaluation 08/24/21   ? Progress Note Due on Visit 20   ? PT Start Time 1501   ? PT Stop Time 2409   ? PT Time Calculation (min) 44 min   ? Activity Tolerance Patient tolerated treatment well   ? Behavior During Therapy [facility] for tasks assessed/performed   ? ?  ?  ? ?  ? ? ? ? ? ? ? ? ? ? ? ? ? ? ? ? ?Past Medical History:  ?Diagnosis Date  ? Arthritis   ? Chronic back pain   ? DDD (degenerative disc disease), lumbar   ? Headache   ? Hypertension   ? MVP (mitral valve prolapse)   ? ?Past Surgical History:  ?Procedure Laterality Date  ? ABDOMINAL HYSTERECTOMY    ? CLOSED MANIPULATION SHOULDER WITH STERIOD INJECTION Right 03/05/2017  ? Procedure: CLOSED MANIPULATION SHOULDER WITH STEROID INJECTION;  Surgeon: Renette Butters, MD;  Location: [place];  Service: Orthopedics;  Laterality: Right;  ? JOINT REPLACEMENT Right   ? TKR  ? KNEE ARTHROSCOPY Bilateral   ? SPINAL CORD STIMULATOR IMPLANT    ? SPINAL CORD STIMULATOR REMOVAL    ? TONSILLECTOMY    ? TOTAL HIP ARTHROPLASTY Right   ? ?There are no problems to display for this patient. ? ? 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 ?THERAPY DIAG:  ?Pain in left hip ? ?Muscle weakness (generalized) ? ?Other abnormalities of gait and mobility ? ?PERTINENT HISTORY: R THA , L THA 2/14 ? ?PRECAUTIONS: Posterior hip precautions ? ?SUBJECTIVE: "The patch helped but the placement was uncomfortable, because that one specific spot just won't stop being sensitive. " ? ?PAIN:  ?Are you having pain? Yes: NPRS scale: 4/10 ?Pain location: outside of the hip ?Pain description: numbing ?Aggravating factors: standing/  walking ?Relieving factors: sitting down. ? ? ? ? ? ? ? ? ?OBJECTIVE:  ?*Unless otherwise noted by date, all objective measures were captured on initial evaluation.  ? ?DIAGNOSTIC FINDINGS:  ?MRI 01/18/2020 ?IMPRESSION: ?No acute abnormality. ?Degenerative tear of the anterior, superior right labrum. ? ?PATIENT SURVEYS:  ?FOTO 50%, predicted 70% ?  06/06/2021 41% ?  06/19/2021  55% limited ? ?COGNITION: ? Overall cognitive status: Within functional limits for tasks assessed   ?  ?SENSATION: ? Light touch: Appears intact ? ?POSTURE:  ?Forward head position ? ?PALPATION: ?TTP along the glute med/ and along the anterior aspect of the acetabulofemoral joint, along the proximal adductors, piriformis and proximal rectus femoris.  ? ?LE AROM/PROM: ? ?A/PROM Right ?04/18/2021 Left ?04/18/2021 Left  05/18/2021  ?Hip flexion [facility] * *60/110  ?Hip extension [facility] WFL *   ?Hip abduction [facility] WFL * *32 in supine  ?Hip adduction [facility] WFL * ? *WFL  ?Hip internal rotation     ?Hip external rotation     ?Knee flexion     ?Knee extension     ?Ankle dorsiflexion     ?Ankle plantarflexion     ?Ankle inversion     ?Ankle eversion     ?   (Blank rows = not tested, * pain during  assessment) ? ?LE MMT: ? ?MMT Right ?04/18/2021 Left ?04/18/2021 Left  05/18/2021 Left ?06/19/2021  ?Hip flexion 4/5 4/-5* 3+/5 4/5  ?Hip extension 4-/5 3/5* 3+/5 4-/5  ?Hip abduction 4-/5 3+/5* 3+/5 4-/5  ?Hip adduction 4-/5 4-/5* 4/5 4/5  ?Hip internal rotation      ?Hip external rotation      ?Knee flexion      ?Knee extension      ?Ankle dorsiflexion      ?Ankle plantarflexion      ?Ankle inversion      ?Ankle eversion      ? (Blank rows = not tested, *pain during testing ) ? ?LOWER EXTREMITY SPECIAL TESTS:  ?N/A ? ?FUNCTIONAL TESTS:  ?5 times sit to stand: 18 seconds ?05/23/2021 - unable to perform ? ?GAIT: ?Assistive device utilized: RW ?Level of assistance: Complete Independence ?Comments: antalgic gait with decreaesd stance on the LLE, and stride on the  RLE ? ? ?TODAY'S TREATMENT: ? ?[place] Adult PT Treatment:                                                DATE: 07/18/2021 ?Therapeutic Exercise: ?LTR 2 x 10  ?Nu-step L6 x 6 min LE only ?Standing hip abduction 2 x 20  ?Standing marching 2 x 20 ?Step up on 8 inch step 2 x 10 LLE only ?Manual Therapy: ?MTPR along the L glute med ?Grade II-III LAD RLE ?Modalities: ?Ice cup x 5 min along the greater trochanter ?Iontophoresis 8m/ml 6 hour patch - placed along the posterior aspect of the greater trochanter. ? ?[facility]                                                DATE: 07/13/2021 ?Therapeutic Exercise: ?Nu-step L7 x 5 min LE only ?Glute med stretching in R sidelying ?Manual Therapy: ?X friction massage along the incision ?MTPR along the glute med x 3 ?Desensitization with soft material x 4 min , progressed to course material x 4 min  ?Modalities: ?Iontophoresis 415mml 6 hour patch  ? ? ?[facility] Adult PT Treatment:                                                DATE: 07/05/2021 ?Therapeutic Exercise: ?Nu-step L7 x 5 min  ?LAQ 2 x 15 with 5# ?Seated march 2 x 15# ?Sidelying hip abduction 2 x 12 3# ?Manual Therapy: ?MTPR along the glute med on the L ?DTM along the L glute med ?Neuromuscular re-ed: ?Corner balance: rhomberg 3 x 30 sec with EC, modified 4 x 30 sec with EC ?Modalities: ?MHP in supine 10 x min ? ? ? ?PATIENT EDUCATION:  ?Education details: re-evaluation findings, POC, goals. reviewed HEP and updated today  ?Person educated: Patient ?Education method: Explanation ?Education comprehension: verbalized understanding ? ? ?HOME EXERCISE PROGRAM: ?Access Code: [email or web address]: [email or web address] ?Date: 06/29/2021 ?Prepared by: KrStarr Lake ?Exercises ?- Hooklying Clamshell with Resistance  - 1 x daily - 7 x weekly - 2 sets - 10 reps ?- Supine Heel Slides (Mirrored)  - 1 x daily - 7  x weekly - 2 sets - 10 reps ?- Supine March  - 1 x daily - 7 x weekly - 2 sets - 10 reps - 5 hold ?- Supine  Quad Set  - 1 x daily - 7 x weekly - 2 sets - 10 reps ?- Modified Thomas Stretch (Mirrored)  - 2 x daily - 7 x weekly - 2 sets - 2 reps - 30 seconds hold ?- Lateral Weight Shift with Parallel Bars (BKA)  - 1 x daily - 7 x weekly - 1 sets - 1 min hold ?- Standing Hip Abduction with Counter Support  - 1 x daily - 7 x weekly - 1 sets - 10 reps ?- SLR  - 1 x daily - 7 x weekly - 3 sets - 10 reps - 1 hold ?- Supine Quadriceps Stretch with Strap on Table  - 1 x daily - 7 x weekly - 3 sets - 30 seconds hold ?- Seated Hamstring Stretch  - 1 x daily - 7 x weekly - 2 sets - 2 reps - 30 hold ? ? ? ? ?ASSESSMENT: ? ?CLINICAL IMPRESSION: ?Mrs Friley is making progress with physical therapy improving her mobility and continues to maintain her funcitonal hip mobility. However, she does continue to have pain located at the greater trochanter rated between 4-6/10 pain today. She does well with strengthening but does report fluctuating pain in the hip. Trialed ice cup to calm down pain/ soreness along the greater trochanter and assist with desensitization, reapplied iontophoresis today altering location to reduce hyper sensitivity.  ? ?IMPAIRMENTS/ DEFICITS: Objective impairments include Abnormal gait, decreased activity tolerance, decreased balance, decreased endurance, difficulty walking, decreased strength, increased edema, increased muscle spasms, postural dysfunction, and pain. ? ?REHAB POTENTIAL: Good ? ?CLINICAL DECISION MAKING: Stable/uncomplicated ? ?EVALUATION COMPLEXITY: Low ? ? ?GOALS: ? ?SHORT TERM GOALS: ? ?STG Name Target Date Goal status  ?1 Pt to be IND with initial HEP to progress PT  ?Baseline:  07/04/2021 ? Met  ?06/19/2021  ?2 Pt to be able to walk and stand for >/= 15 min for progression of endurance 07/04/2021 ? ? Met ?06/19/2021  ?3 Increase L hip flexoin to >/= 90 degrees actively with </= 5/10 pain max for progression of hip mobility  07/04/2021 ? Met ?06/19/2021  ? ?LONG TERM GOALS:  ? ?LTG Name Target Date Goal  status  ?1 Increase L hip gross strength to >/= 4/5 to promote hip stability and maximize safety  ?Baseline: 07/18/2021 ? Ongoing ?07/20/2021  ?2 Increase L hip AROM WFL compared bil to assist with general mobility required

## 2021-07-20 ENCOUNTER — Ambulatory Visit: Payer: Medicare Other | Admitting: Physical Therapy

## 2021-07-20 ENCOUNTER — Encounter: Payer: Self-pay | Admitting: Physical Therapy

## 2021-07-20 DIAGNOSIS — M25552 Pain in left hip: Secondary | ICD-10-CM

## 2021-07-20 DIAGNOSIS — M6281 Muscle weakness (generalized): Secondary | ICD-10-CM

## 2021-07-20 DIAGNOSIS — R2689 Other abnormalities of gait and mobility: Secondary | ICD-10-CM

## 2021-07-25 ENCOUNTER — Ambulatory Visit: Payer: Medicare Other | Admitting: Physical Therapy

## 2021-07-25 ENCOUNTER — Encounter: Payer: Self-pay | Admitting: Physical Therapy

## 2021-07-25 DIAGNOSIS — M6281 Muscle weakness (generalized): Secondary | ICD-10-CM

## 2021-07-25 DIAGNOSIS — M25552 Pain in left hip: Secondary | ICD-10-CM

## 2021-07-25 DIAGNOSIS — R2689 Other abnormalities of gait and mobility: Secondary | ICD-10-CM

## 2021-07-25 NOTE — Therapy (Signed)
?OUTPATIENT PHYSICAL THERAPY TREATMENT NOTE  ? ? ? ?  ? ?Patient Name: Christine Roberson ?MRN: [id number] ?DOB:April 06, 1965, 56 y.o., female ?Today's Date: 07/25/2021 ? ?PCP: Daleen Snook, NP ?REFERRING PROVIDER: Daleen Snook, NP ? ? PT End of Session - 07/25/21 1429   ? ? Visit Number 18   ? Number of Visits 25   ? Date for PT Re-Evaluation 08/24/21   ? PT Start Time 1428   ? PT Stop Time 1500   ? PT Time Calculation (min) 32 min   ? ?  ?  ? ?  ? ? ? ? ? ? ? ? ? ? ? ? ? ? ? ? ? ?Past Medical History:  ?Diagnosis Date  ? Arthritis   ? Chronic back pain   ? DDD (degenerative disc disease), lumbar   ? Headache   ? Hypertension   ? MVP (mitral valve prolapse)   ? ?Past Surgical History:  ?Procedure Laterality Date  ? ABDOMINAL HYSTERECTOMY    ? CLOSED MANIPULATION SHOULDER WITH STERIOD INJECTION Right 03/05/2017  ? Procedure: CLOSED MANIPULATION SHOULDER WITH STEROID INJECTION;  Surgeon: Renette Butters, MD;  Location: [place];  Service: Orthopedics;  Laterality: Right;  ? JOINT REPLACEMENT Right   ? TKR  ? KNEE ARTHROSCOPY Bilateral   ? SPINAL CORD STIMULATOR IMPLANT    ? SPINAL CORD STIMULATOR REMOVAL    ? TONSILLECTOMY    ? TOTAL HIP ARTHROPLASTY Right   ? ?There are no problems to display for this patient. ? ? 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 ?THERAPY DIAG:  ?Pain in left hip ? ?Muscle weakness (generalized) ? ?Other abnormalities of gait and mobility ? ?PERTINENT HISTORY: R THA , L THA 2/14 ? ?PRECAUTIONS: Posterior hip precautions ? ?SUBJECTIVE: "The ionophoresis helped the pain started back on Saturday. That one spot on the incision is still just giving me so much issues." ? ?PAIN:  ?Are you having pain? Yes: NPRS scale: 5/10 ?Pain location: outside of the hip ?Pain description: numbing ?Aggravating factors: standing/ walking ?Relieving factors: sitting down. ? ? ? ? ? ? ? ? ?OBJECTIVE:  ?*Unless otherwise noted by date, all objective measures were captured  on initial evaluation.  ? ?DIAGNOSTIC FINDINGS:  ?MRI 01/18/2020 ?IMPRESSION: ?No acute abnormality. ?Degenerative tear of the anterior, superior right labrum. ? ?PATIENT SURVEYS:  ?FOTO 50%, predicted 70% ?  06/06/2021 41% ?  06/19/2021  55% limited ? ?COGNITION: ? Overall cognitive status: Within functional limits for tasks assessed   ?  ?SENSATION: ? Light touch: Appears intact ? ?POSTURE:  ?Forward head position ? ?PALPATION: ?TTP along the glute med/ and along the anterior aspect of the acetabulofemoral joint, along the proximal adductors, piriformis and proximal rectus femoris.  ? ?LE AROM/PROM: ? ?A/PROM Right ?04/18/2021 Left ?04/18/2021 Left  05/18/2021  ?Hip flexion [facility] * *60/110  ?Hip extension [facility] WFL *   ?Hip abduction [facility] WFL * *32 in supine  ?Hip adduction [facility] WFL * ? *WFL  ?Hip internal rotation     ?Hip external rotation     ?Knee flexion     ?Knee extension     ?Ankle dorsiflexion     ?Ankle plantarflexion     ?Ankle inversion     ?Ankle eversion     ?   (Blank rows = not tested, * pain during assessment) ? ?LE MMT: ? ?MMT Right ?04/18/2021 Left ?04/18/2021 Left  05/18/2021 Left ?06/19/2021  ?Hip flexion 4/5 4/-5* 3+/5 4/5  ?  Hip extension 4-/5 3/5* 3+/5 4-/5  ?Hip abduction 4-/5 3+/5* 3+/5 4-/5  ?Hip adduction 4-/5 4-/5* 4/5 4/5  ?Hip internal rotation      ?Hip external rotation      ?Knee flexion      ?Knee extension      ?Ankle dorsiflexion      ?Ankle plantarflexion      ?Ankle inversion      ?Ankle eversion      ? (Blank rows = not tested, *pain during testing ) ? ?LOWER EXTREMITY SPECIAL TESTS:  ?N/A ? ?FUNCTIONAL TESTS:  ?5 times sit to stand: 18 seconds ?05/23/2021 - unable to perform ? ?GAIT: ?Assistive device utilized: RW ?Level of assistance: Complete Independence ?Comments: antalgic gait with decreaesd stance on the LLE, and stride on the RLE ? ? ?TODAY'S TREATMENT: ? ?[place] Adult PT Treatment:                                                DATE: 07/25/2021 ?Therapeutic Exercise: ?Sidelying hip  abdcution 2 x 10 ?Glute med stretchin 2 x 30 sec ?Manual Therapy: ?IASTM along the glute med ? ?Trigger Point Dry-Needling  ?Treatment instructions: Expect mild to moderate muscle soreness. S/S of pneumothorax if dry needled over a lung field, and to seek immediate medical attention should they occur. Patient verbalized understanding of these instructions and education. ? ?Patient Consent Given: Yes ?Education handout provided: Yes ?Muscles treated: L glute med ?Electrical stimulation performed: Yes ?Parameters:  CPS L 20 x 8 min adjusting intermittently ?Treatment response/outcome: twistch response and lengthening of the muscle ? ?Neuromuscular re-ed: ?Marching on airex pad ?Modalities: ?Iontophoresis 60m/ml 6 hour patch L glute med ? ? ?[facility]                                                [place]: 07/18/[facility]21 ?Therapeutic Exercise: ?LTR 2 x 10  ?Nu-step L6 x 6 min LE only ?Standing hip abduction 2 x 20  ?Standing marching 2 x 20 ?Step up on 8 inch step 2 x 10 LLE only ?Manual Therapy: ?MTPR along the L glute med ?Grade II-III LAD RLE ?Modalities: ?Ice cup x 5 min along the greater trochanter ?Iontophoresis 43mml 6 hour patch - placed along the posterior aspect of the greater trochanter. ? ?[facility]                                                DATE: 07/13/2021 ?Therapeutic Exercise: ?Nu-step L7 x 5 min LE only ?Glute med stretching in R sidelying ?Manual Therapy: ?X friction massage along the incision ?MTPR along the glute med x 3 ?Desensitization with soft material x 4 min , progressed to course material x 4 min  ?Modalities: ?Iontophoresis 37m69ml 6 hour patch  ? ? ?PATIENT EDUCATION:  ?Education details: re-evaluation findings, POC, goals. reviewed HEP and updated today  ?Person educated: Patient ?Education method: Explanation ?Education comprehension: verbalized understanding ? ? ?HOME EXERCISE PROGRAM: ?Access Code: [email or web address]: [email or web address] ?Date:  06/29/2021 ?Prepared by: KriStarr Lake?Exercises ?- Hooklying Clamshell with Resistance  -  1 x daily - 7 x weekly - 2 sets - 10 reps ?- Supine Heel Slides (Mirrored)  - 1 x daily - 7 x weekly - 2 sets - 10 reps ?- Supine March  - 1 x daily - 7 x weekly - 2 sets - 10 reps - 5 hold ?- Supine Quad Set  - 1 x daily - 7 x weekly - 2 sets - 10 reps ?- Modified Thomas Stretch (Mirrored)  - 2 x daily - 7 x weekly - 2 sets - 2 reps - 30 seconds hold ?- Lateral Weight Shift with Parallel Bars (BKA)  - 1 x daily - 7 x weekly - 1 sets - 1 min hold ?- Standing Hip Abduction with Counter Support  - 1 x daily - 7 x weekly - 1 sets - 10 reps ?- SLR  - 1 x daily - 7 x weekly - 3 sets - 10 reps - 1 hold ?- Supine Quadriceps Stretch with Strap on Table  - 1 x daily - 7 x weekly - 3 sets - 30 seconds hold ?- Seated Hamstring Stretch  - 1 x daily - 7 x weekly - 2 sets - 2 reps - 30 hold ? ? ? ? ?ASSESSMENT: ? ?CLINICAL IMPRESSION: ?Limited session due pt's transportation running late. Pt reported improvement of pain following the last session that felt better until Saturday. Educated and consent was provided for TPDN combined with Estim for the glute med followed with IASTM techniques. Continued working on glute activation and reapplication of ionto to calm down soreness. Pt returns to see her MD on Friday 07/28/2021 ? ?IMPAIRMENTS/ DEFICITS: Objective impairments include Abnormal gait, decreased activity tolerance, decreased balance, decreased endurance, difficulty walking, decreased strength, increased edema, increased muscle spasms, postural dysfunction, and pain. ? ?REHAB POTENTIAL: Good ? ?CLINICAL DECISION MAKING: Stable/uncomplicated ? ?EVALUATION COMPLEXITY: Low ? ? ?GOALS: ? ?SHORT TERM GOALS: ? ?STG Name Target Date Goal status  ?1 Pt to be IND with initial HEP to progress PT  ?Baseline:  07/04/2021 ? Met  ?06/19/2021  ?2 Pt to be able to walk and stand for >/= 15 min for progression of endurance 07/04/2021 ? ? Met ?06/19/2021   ?3 Increase L hip flexoin to >/= 90 degrees actively with </= 5/10 pain max for progression of hip mobility  07/04/2021 ? Met ?06/19/2021  ? ?LONG TERM GOALS:  ? ?LTG Name Target Date Goal status  ?1 Increa

## 2021-07-26 ENCOUNTER — Encounter: Payer: Self-pay | Admitting: Physical Therapy

## 2021-07-26 NOTE — Therapy (Incomplete)
?OUTPATIENT PHYSICAL THERAPY TREATMENT NOTE  ? ? ? ?  ? ?Patient Name: Christine Roberson ?MRN: [id number] ?DOB:23-Feb-1966, 56 y.o., female ?Today's Date: 07/26/2021 ? ?PCP: Daleen Snook, NP ?REFERRING PROVIDER: Rosanne Ashing, MD ? ? ? ? ? ? ? ? ? ? ? ? ? ? ? ? ? ? ? ?Past Medical History:  ?Diagnosis Date  ? Arthritis   ? Chronic back pain   ? DDD (degenerative disc disease), lumbar   ? Headache   ? Hypertension   ? MVP (mitral valve prolapse)   ? ?Past Surgical History:  ?Procedure Laterality Date  ? ABDOMINAL HYSTERECTOMY    ? CLOSED MANIPULATION SHOULDER WITH STERIOD INJECTION Right 03/05/2017  ? Procedure: CLOSED MANIPULATION SHOULDER WITH STEROID INJECTION;  Surgeon: Renette Butters, MD;  Location: [place];  Service: Orthopedics;  Laterality: Right;  ? JOINT REPLACEMENT Right   ? TKR  ? KNEE ARTHROSCOPY Bilateral   ? SPINAL CORD STIMULATOR IMPLANT    ? SPINAL CORD STIMULATOR REMOVAL    ? TONSILLECTOMY    ? TOTAL HIP ARTHROPLASTY Right   ? ?There are no problems to display for this patient. ? ? 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 ?THERAPY DIAG:  ?No diagnosis found. ? ?PERTINENT HISTORY: R THA , L THA 2/14 ? ?PRECAUTIONS: Posterior hip precautions ? ?SUBJECTIVE: *** ? ?PAIN:  ?Are you having pain? Yes: NPRS scale: 5/10 ?Pain location: outside of the hip ?Pain description: numbing ?Aggravating factors: standing/ walking ?Relieving factors: sitting down. ? ? ? ? ? ? ? ? ?OBJECTIVE:  ?*Unless otherwise noted by date, all objective measures were captured on initial evaluation.  ? ?DIAGNOSTIC FINDINGS:  ?MRI 01/18/2020 ?IMPRESSION: ?No acute abnormality. ?Degenerative tear of the anterior, superior right labrum. ? ?PATIENT SURVEYS:  ?FOTO 50%, predicted 70% ?  06/06/2021 41% ?  06/19/2021  55% limited ? ?COGNITION: ? Overall cognitive status: Within functional limits for tasks assessed   ?  ?SENSATION: ? Light touch: Appears intact ? ?POSTURE:  ?Forward head  position ? ?PALPATION: ?TTP along the glute med/ and along the anterior aspect of the acetabulofemoral joint, along the proximal adductors, piriformis and proximal rectus femoris.  ? ?LE AROM/PROM: ? ?A/PROM Right ?04/18/2021 Left ?04/18/2021 Left  05/18/2021  ?Hip flexion [facility] * *60/110  ?Hip extension [facility] WFL *   ?Hip abduction [facility] WFL * *32 in supine  ?Hip adduction [facility] WFL * ? *WFL  ?Hip internal rotation     ?Hip external rotation     ?Knee flexion     ?Knee extension     ?Ankle dorsiflexion     ?Ankle plantarflexion     ?Ankle inversion     ?Ankle eversion     ?   (Blank rows = not tested, * pain during assessment) ? ?LE MMT: ? ?MMT Right ?04/18/2021 Left ?04/18/2021 Left  05/18/2021 Left ?06/19/2021  ?Hip flexion 4/5 4/-5* 3+/5 4/5  ?Hip extension 4-/5 3/5* 3+/5 4-/5  ?Hip abduction 4-/5 3+/5* 3+/5 4-/5  ?Hip adduction 4-/5 4-/5* 4/5 4/5  ?Hip internal rotation      ?Hip external rotation      ?Knee flexion      ?Knee extension      ?Ankle dorsiflexion      ?Ankle plantarflexion      ?Ankle inversion      ?Ankle eversion      ? (Blank rows = not tested, *pain during testing ) ? ?LOWER EXTREMITY SPECIAL TESTS:  ?N/A ? ?  FUNCTIONAL TESTS:  ?5 times sit to stand: 18 seconds ?05/23/2021 - unable to perform ? ?GAIT: ?Assistive device utilized: RW ?Level of assistance: Complete Independence ?Comments: antalgic gait with decreaesd stance on the LLE, and stride on the RLE ? ? ?TODAY'S TREATMENT: ? ?[facility] Adult PT Treatment:                                                DATE: 07/27/2021 ?Therapeutic Exercise: ?*** ?Manual Therapy: ?*** ?Neuromuscular re-ed: ?*** ?Therapeutic Activity: ?*** ?Modalities: ?*** ?Self Care: ?*** ? ? ?[facility] Adult PT Treatment:                                                DATE: 07/25/2021 ?Therapeutic Exercise: ?Sidelying hip abdcution 2 x 10 ?Glute med stretchin 2 x 30 sec ?Manual Therapy: ?IASTM along the glute med ? ?Trigger Point Dry-Needling  ?Treatment instructions: Expect mild to moderate  muscle soreness. S/S of pneumothorax if dry needled over a lung field, and to seek immediate medical attention should they occur. Patient verbalized understanding of these instructions and education. ? ?Patient Consent Given: Yes ?Education handout provided: Yes ?Muscles treated: L glute med ?Electrical stimulation performed: Yes ?Parameters:  CPS L 20 x 8 min adjusting intermittently ?Treatment response/outcome: twistch response and lengthening of the muscle ? ?Neuromuscular re-ed: ?Marching on airex pad ?Modalities: ?Iontophoresis 30m/ml 6 hour patch L glute med ? ? ?[facility]                                                [facility]: 07[facility] ?Therapeutic Exercise: ?LTR 2 x 10  ?Nu-step L6 x 6 min LE only ?Standing hip abduction 2 x 20  ?Standing marching 2 x 20 ?Step up on 8 inch step 2 x 10 LLE only ?Manual Therapy: ?MTPR along the L glute med ?Grade II-III LAD RLE ?Modalities: ?Ice cup x 5 min along the greater trochanter ?Iontophoresis 462mml 6 hour patch - placed along the posterior aspect of the greater trochanter. ? ? ? ? ?PATIENT EDUCATION:  ?Education details: re-evaluation findings, POC, goals. reviewed HEP and updated today  ?Person educated: Patient ?Education method: Explanation ?Education comprehension: verbalized understanding ? ? ?HOME EXERCISE PROGRAM: ?Access Code: [email or web address]: [email or web address] ?Date: 06/29/2021 ?Prepared by: [place] ?Exercises ?- Hooklying Clamshell with Resistance  - 1 x daily - 7 x weekly - 2 sets - 10 reps ?- Supine Heel Slides (Mirrored)  - 1 x daily - 7 x weekly - 2 sets - 10 reps ?- Supine March  - 1 x daily - 7 x weekly - 2 sets - 10 reps - 5 hold ?- Supine Quad Set  - 1 x daily - 7 x weekly - 2 sets - 10 reps ?- Modified Thomas Stretch (Mirrored)  - 2 x daily - 7 x weekly - 2 sets - 2 reps - 30 seconds hold ?- Lateral Weight Shift with Parallel Bars (BKA)  - 1 x daily - 7 x weekly - 1 sets - 1 min hold ?- Standing Hip Abduction  with Counter Support  -  1 x daily - 7 x weekly - 1 sets - 10 reps ?- SLR  - 1 x daily - 7 x weekly - 3 sets - 10 reps - 1 hold ?- Supine Quadriceps Stretch with Strap on Table  - 1 x daily - 7 x weekly - 3 sets - 30 seconds hold ?- Seated Hamstring Stretch  - 1 x daily - 7 x weekly - 2 sets - 2 reps - 30 hold ? ? ? ? ?ASSESSMENT: ? ?CLINICAL IMPRESSION: ?*** ? ?IMPAIRMENTS/ DEFICITS: Objective impairments include Abnormal gait, decreased activity tolerance, decreased balance, decreased endurance, difficulty walking, decreased strength, increased edema, increased muscle spasms, postural dysfunction, and pain. ? ?REHAB POTENTIAL: Good ? ?CLINICAL DECISION MAKING: Stable/uncomplicated ? ?EVALUATION COMPLEXITY: Low ? ? ?GOALS: ? ?SHORT TERM GOALS: ? ?STG Name Target Date Goal status  ?1 Pt to be IND with initial HEP to progress PT  ?Baseline:  07/04/2021 ? Met  ?06/19/2021  ?2 Pt to be able to walk and stand for >/= 15 min for progression of endurance 07/04/2021 ? ? Met ?06/19/2021  ?3 Increase L hip flexoin to >/= 90 degrees actively with </= 5/10 pain max for progression of hip mobility  07/04/2021 ? Met ?06/19/2021  ? ?LONG TERM GOALS:  ? ?LTG Name Target Date Goal status  ?1 Increase L hip gross strength to >/= 4/5 to promote hip stability and maximize safety  ?Baseline: 07/18/2021 ? Ongoing ?07/20/2021  ?2 Increase L hip AROM WFL compared bil to assist with general mobility required for ADLS with </= max 2/19 pain ?Baseline: 07/18/2021 Ongoing  ?07/20/2021  ?3 Pt to be able to stand/ walk for >/= 30 min with LRAD demonstrating heel strike/ toe off pattern with min deviation for functional endurance for in home amb and short community distances ?Baseline: 07/18/2021 Ongoing 07/20/2021  ?4 Pt to increase FOTO score to >/= 70% to demo improvement in function ?Baseline: 07/18/2021 Ongoing ?07/20/2021  ?4 Pt to be able to perform 5 x sit to stand </= 15 seconds to demo improvement in function ?Baseline: at eval 18 seconds, at re-eval  unable to perform 07/18/2021 Ongoing ?07/20/2021  ?5 Pt to be IND with all HEP to maintain and progress curreht LOF IND 07/18/2021 Ongoing ?07/20/2021  ? ?PLAN: ?PT FREQUENCY: 2x/week ? ?PT DURATION: 6 weeks ? ?PLANNE

## 2021-07-27 ENCOUNTER — Ambulatory Visit: Payer: Medicare Other

## 2021-07-29 NOTE — Therapy (Signed)
?OUTPATIENT PHYSICAL THERAPY TREATMENT NOTE  ? ? ? ?  ? ?Patient Name: Christine Roberson ?MRN: [id number] ?DOB:01-11-1966, 56 y.o., female ?Today's Date: 08/01/2021 ? ?PCP: Josephina Gip, NP ?REFERRING PROVIDER: Josephina Gip, NP ? ? PT End of Session - 08/01/21 1254   ? ? Visit Number [id number]   ? Number of Visits 25   ? Date for PT Re-Evaluation 08/24/21   ? Authorization Type [employer] MCR, KX Modifier after v15   ? Progress Note Due on Visit 20   ? PT Start Time 1300   ? PT Stop Time 1345   ? PT Time Calculation (min) 45 min   ? Equipment Utilized During Treatment Gait belt   ? Activity Tolerance Patient tolerated treatment well   ? Behavior During Therapy [facility] for tasks assessed/performed   ? ?  ?  ? ?  ? ? ? ? ? ? ? ? ? ? ? ? ? ? ? ? ? ? ?Past Medical History:  ?Diagnosis Date  ? Arthritis   ? Chronic back pain   ? DDD (degenerative disc disease), lumbar   ? Headache   ? Hypertension   ? MVP (mitral valve prolapse)   ? ?Past Surgical History:  ?Procedure Laterality Date  ? ABDOMINAL HYSTERECTOMY    ? CLOSED MANIPULATION SHOULDER WITH STERIOD INJECTION Right 03/05/2017  ? Procedure: CLOSED MANIPULATION SHOULDER WITH STEROID INJECTION;  Surgeon: Sheral Apley, MD;  Location: [facility];  Service: Orthopedics;  Laterality: Right;  ? JOINT REPLACEMENT Right   ? TKR  ? KNEE ARTHROSCOPY Bilateral   ? SPINAL CORD STIMULATOR IMPLANT    ? SPINAL CORD STIMULATOR REMOVAL    ? TONSILLECTOMY    ? TOTAL HIP ARTHROPLASTY Right   ? ?There are no problems to display for this patient. ? ? 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 ?THERAPY DIAG:  ?Pain in left hip ? ?Muscle weakness (generalized) ? ?Other abnormalities of gait and mobility ? ?PERTINENT HISTORY: R THA , L THA 2/14 ? ?PRECAUTIONS: Posterior hip precautions ? ?SUBJECTIVE: Pt reports 5/10 Lt hip pain today, adding that her follow-up with her doc regarding her hip pain went well. She had X-rays of BIL hips at this visit, which the pt  reports "looked good." She reports she was put back on Celebrex for pain and inflammation.  ? ?PAIN:  ?Are you having pain? Yes: NPRS scale: 5/10 ?Pain location: outside of the hip ?Pain description: numbing ?Aggravating factors: standing/ walking ?Relieving factors: sitting down. ? ? ? ? ? ? ? ? ?OBJECTIVE:  ?*Unless otherwise noted by date, all objective measures were captured on initial evaluation.  ? ?DIAGNOSTIC FINDINGS:  ?MRI 01/18/2020 ?IMPRESSION: ?No acute abnormality. ?Degenerative tear of the anterior, superior right labrum. ? ?PATIENT SURVEYS:  ?FOTO 50%, predicted 70% ?  06/06/2021 41% ?  06/19/2021  55% limited ? ?COGNITION: ? Overall cognitive status: Within functional limits for tasks assessed   ?  ?SENSATION: ? Light touch: Appears intact ? ?POSTURE:  ?Forward head position ? ?PALPATION: ?TTP along the glute med/ and along the anterior aspect of the acetabulofemoral joint, along the proximal adductors, piriformis and proximal rectus femoris.  ? ?LE AROM/PROM: ? ?A/PROM Right ?04/18/2021 Left ?04/18/2021 Left  05/18/2021  ?Hip flexion [facility] * *60/110  ?Hip extension [facility] WFL *   ?Hip abduction [facility] WFL * *32 in supine  ?Hip adduction [facility] WFL * ? *WFL  ?Hip internal rotation     ?Hip external rotation     ?  Knee flexion     ?Knee extension     ?Ankle dorsiflexion     ?Ankle plantarflexion     ?Ankle inversion     ?Ankle eversion     ?   (Blank rows = not tested, * pain during assessment) ? ?LE MMT: ? ?MMT Right ?04/18/2021 Left ?04/18/2021 Left  05/18/2021 Left ?06/19/2021  ?Hip flexion 4/5 4/-5* 3+/5 4/5  ?Hip extension 4-/5 3/5* 3+/5 4-/5  ?Hip abduction 4-/5 3+/5* 3+/5 4-/5  ?Hip adduction 4-/5 4-/5* 4/5 4/5  ?Hip internal rotation      ?Hip external rotation      ?Knee flexion      ?Knee extension      ?Ankle dorsiflexion      ?Ankle plantarflexion      ?Ankle inversion      ?Ankle eversion      ? (Blank rows = not tested, *pain during testing ) ? ?LOWER EXTREMITY SPECIAL TESTS:  ?N/A ? ?FUNCTIONAL TESTS:   ?5 times sit to stand: 18 seconds ?05/23/2021 - unable to perform ? ?08/01/2021: 5xSTS: 17 seconds ? ?GAIT: ?Assistive device utilized: RW ?Level of assistance: Complete Independence ?Comments: antalgic gait with decreaesd stance on the LLE, and stride on the RLE ? ? ?TODAY'S TREATMENT: ? ?[facility] Adult PT Treatment:                                                DATE: 08/01/2021 ?Therapeutic Exercise: ?Nu-step L6 x 6 min LE only, while collecting subjective information ?Mini-squat side step walkouts with 7# cable to thigh attachment x4 BIL ?Standing hip extension with 7# cable to ankle attachment 2x10 BIL ?Sidelying Lt IT band stretch x2 minutes ?Dead lift with 10# kettlebell in front of table 3x8 ?Supine 90/90 BIL hip isometric with handhold resistance 3x30sec ?Manual Therapy: ?N/A ?Neuromuscular re-ed: ?N/A ?Therapeutic Activity: ?N/A ?Modalities: ?N/A ?Self Care: ?N/A ? ? ?[facility] Adult PT Treatment:                                                DATE: 07/25/2021 ?Therapeutic Exercise: ?Sidelying hip abdcution 2 x 10 ?Glute med stretchin 2 x 30 sec ?Manual Therapy: ?IASTM along the glute med ? ?Trigger Point Dry-Needling  ?Treatment instructions: Expect mild to moderate muscle soreness. S/S of pneumothorax if dry needled over a lung field, and to seek immediate medical attention should they occur. Patient verbalized understanding of these instructions and education. ? ?Patient Consent Given: Yes ?Education handout provided: Yes ?Muscles treated: L glute med ?Electrical stimulation performed: Yes ?Parameters:  CPS L 20 x 8 min adjusting intermittently ?Treatment response/outcome: twistch response and lengthening of the muscle ? ?Neuromuscular re-ed: ?Marching on airex pad ?Modalities: ?Iontophoresis 4mg /ml 6 hour patch L glute med ? ? ?[facility] Adult PT Treatment:                                                DATE: 07/18/2021 ?Therapeutic Exercise: ?LTR 2 x 10  ?Nu-step L6 x 6 min LE only ?Standing hip abduction 2 x 20   ?Standing marching 2 x 20 ?Step  up on 8 inch step 2 x 10 LLE only ?Manual Therapy: ?MTPR along the L glute med ?Grade II-III LAD RLE ?Modalities: ?Ice cup x 5 min along the greater trochanter ?Iontophoresis 4mg /ml 6 hour patch - placed along the posterior aspect of the greater trochanter. ? ? ? ? ?PATIENT EDUCATION:  ?Education details: re-evaluation findings, POC, goals. reviewed HEP and updated today  ?Person educated: Patient ?Education method: Explanation ?Education comprehension: verbalized understanding ? ? ?HOME EXERCISE PROGRAM: ?Access Code: [email or web address] ?URL: [email or web address] ?Date: 06/29/2021 ?Prepared by: 07/01/2021 ? ?Exercises ?- Hooklying Clamshell with Resistance  - 1 x daily - 7 x weekly - 2 sets - 10 reps ?- Supine Heel Slides (Mirrored)  - 1 x daily - 7 x weekly - 2 sets - 10 reps ?- Supine March  - 1 x daily - 7 x weekly - 2 sets - 10 reps - 5 hold ?- Supine Quad Set  - 1 x daily - 7 x weekly - 2 sets - 10 reps ?- Modified Thomas Stretch (Mirrored)  - 2 x daily - 7 x weekly - 2 sets - 2 reps - 30 seconds hold ?- Lateral Weight Shift with Parallel Bars (BKA)  - 1 x daily - 7 x weekly - 1 sets - 1 min hold ?- Standing Hip Abduction with Counter Support  - 1 x daily - 7 x weekly - 1 sets - 10 reps ?- SLR  - 1 x daily - 7 x weekly - 3 sets - 10 reps - 1 hold ?- Supine Quadriceps Stretch with Strap on Table  - 1 x daily - 7 x weekly - 3 sets - 30 seconds hold ?- Seated Hamstring Stretch  - 1 x daily - 7 x weekly - 2 sets - 2 reps - 30 hold ? ?Added 08/01/2021: ?- Sidelying ITB Stretch off Table  - 1 x daily - 7 x weekly - 2-min hold ? ? ? ? ?ASSESSMENT: ? ?CLINICAL IMPRESSION: ?Pt responded well to all interventions today, demonstrating good form and no pain with new exercises. She reports that higher-level exercises are slowly getting easier, although they continue to present a challenge. She will continue to benefit from skilled PT to address her primary impairments and return  to her prior level of function with less limitation. ? ?IMPAIRMENTS/ DEFICITS: Objective impairments include Abnormal gait, decreased activity tolerance, decreased balance, decreased endurance, difficulty walking

## 2021-08-01 ENCOUNTER — Ambulatory Visit: Payer: Medicare Other | Attending: Adult Reconstructive Orthopaedic Surgery

## 2021-08-01 DIAGNOSIS — M6281 Muscle weakness (generalized): Secondary | ICD-10-CM | POA: Diagnosis present

## 2021-08-01 DIAGNOSIS — M25552 Pain in left hip: Secondary | ICD-10-CM | POA: Insufficient documentation

## 2021-08-01 DIAGNOSIS — R2689 Other abnormalities of gait and mobility: Secondary | ICD-10-CM | POA: Diagnosis present

## 2021-08-02 NOTE — Therapy (Signed)
?OUTPATIENT PHYSICAL THERAPY TREATMENT NOTE/PROGRESS NOTE ? ?Progress Note ?Reporting Period 06/29/2021 to 08/03/2021 ? ? ?See note below for Objective Data and Assessment of Progress/Goals.  ? ? ? ? ?  ? ?Patient Name: Christine Roberson ?MRN: [id number] ?DOB:[birth year], 56 y.o., female ?Today's Date: 08/03/2021 ? ?PCP: Daleen Snook, NP ?REFERRING PROVIDER: Rosanne Ashing, MD ? ? PT End of Session - 08/03/21 1348   ? ? Visit Number 20   ? Number of Visits 25   ? Date for PT Re-Evaluation 08/24/21   ? Authorization Type UHC MCR, KX Modifier after v15   ? Progress Note Due on Visit 20   ? PT Start Time [id number]   ? PT Stop Time 1435   ? PT Time Calculation (min) 43 min   ? Equipment Utilized During Treatment Gait belt   ? Activity Tolerance Patient tolerated treatment well   ? Behavior During Therapy [facility] for tasks assessed/performed   ? ?  ?  ? ?  ? ? ? ? ? ? ? ? ? ? ? ? ? ? ? ? ? ? ? ?Past Medical History:  ?Diagnosis Date  ? Arthritis   ? Chronic back pain   ? DDD (degenerative disc disease), lumbar   ? Headache   ? Hypertension   ? MVP (mitral valve prolapse)   ? ?Past Surgical History:  ?Procedure Laterality Date  ? ABDOMINAL HYSTERECTOMY    ? CLOSED MANIPULATION SHOULDER WITH STERIOD INJECTION Right 03/05/2017  ? Procedure: CLOSED MANIPULATION SHOULDER WITH STEROID INJECTION;  Surgeon: Renette Butters, MD;  Location: [place];  Service: Orthopedics;  Laterality: Right;  ? JOINT REPLACEMENT Right   ? TKR  ? KNEE ARTHROSCOPY Bilateral   ? SPINAL CORD STIMULATOR IMPLANT    ? SPINAL CORD STIMULATOR REMOVAL    ? TONSILLECTOMY    ? TOTAL HIP ARTHROPLASTY Right   ? ?There are no problems to display for this patient. ? ? 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 ?THERAPY DIAG:  ?Pain in left hip ? ?Muscle weakness (generalized) ? ?Other abnormalities of gait and mobility ? ?PERTINENT HISTORY: R THA , L THA 2/14 ? ?PRECAUTIONS: Posterior hip precautions ? ?SUBJECTIVE: Pt reports that  she experienced moderate soreness following her last treatment, although this has returned to baseline since then. She also reports continued pain along her Lt hip incision. ? ?PAIN:  ?Are you having pain? Yes: NPRS scale: 5/10 ?Pain location: outside of the hip ?Pain description: numbing ?Aggravating factors: standing/ walking ?Relieving factors: sitting down. ? ? ? ? ? ? ? ? ?OBJECTIVE:  ?*Unless otherwise noted by date, all objective measures were captured on initial evaluation.  ? ?DIAGNOSTIC FINDINGS:  ?MRI 01/18/2020 ?IMPRESSION: ?No acute abnormality. ?Degenerative tear of the anterior, superior right labrum. ? ?PATIENT SURVEYS:  ?FOTO 50%, predicted 70% ?  06/06/2021 41% ?  06/19/2021  55% limited ? ?COGNITION: ? Overall cognitive status: Within functional limits for tasks assessed   ?  ?SENSATION: ? Light touch: Appears intact ? ?POSTURE:  ?Forward head position ? ?PALPATION: ?TTP along the glute med/ and along the anterior aspect of the acetabulofemoral joint, along the proximal adductors, piriformis and proximal rectus femoris.  ? ?LE AROM/PROM: ? ?A/PROM Right ?04/18/2021 Left ?04/18/2021 Left  05/18/2021 Left ?08/03/2021  ?Hip flexion [facility] * *60/110 120/ 130p!  ?Hip extension [facility] WFL *    ?Hip abduction [facility] WFL * *32 in supine 45/ 50p!  ?Hip adduction [facility] WFL * ? *WFL   ?  Hip internal rotation      ?Hip external rotation      ?Knee flexion      ?Knee extension      ?Ankle dorsiflexion      ?Ankle plantarflexion      ?Ankle inversion      ?Ankle eversion      ?   (Blank rows = not tested, * pain during assessment) ? ?LE MMT: ? ?MMT Right ?04/18/2021 Left ?04/18/2021 Left  05/18/2021 Left ?06/19/2021 Right ?08/03/2021 Left ?08/03/2021  ?Hip flexion 4/5 4/-5* 3+/5 4/5 4+/5 4+/5  ?Hip extension 4-/5 3/5* 3+/5 4-/5 4+/5 4+/5  ?Hip abduction 4-/5 3+/5* 3+/5 4-/5 4+/5 5/5  ?Hip adduction 4-/5 4-/5* 4/5 4/5 5/5 5/5  ?Hip internal rotation        ?Hip external rotation        ?Knee flexion        ?Knee extension        ?Ankle  dorsiflexion        ?Ankle plantarflexion        ?Ankle inversion        ?Ankle eversion        ? (Blank rows = not tested, *pain during testing ) ? ?LOWER EXTREMITY SPECIAL TESTS:  ?N/A ? ?FUNCTIONAL TESTS:  ?5 times sit to stand: 18 seconds ?05/23/2021 - unable to perform ? ?08/01/2021: 5xSTS: 17 seconds ? ?GAIT: ?Assistive device utilized: RW ?Level of assistance: Complete Independence ?Comments: antalgic gait with decreaesd stance on the LLE, and stride on the RLE ? ? ?TODAY'S TREATMENT: ? ?[place] Adult PT Treatment:                                                DATE: 08/03/2021 ?Therapeutic Exercise: ?Nu-step L6 x 6 min LE only, while collecting subjective information ?Supine 90/90 BIL hip isometric with handhold resistance 3x30sec ?Prone alternating hip extension 3x20 with 3-sec hold BIL ?Squat while holding two 7# cables at shoulder level 3x8 ?Bridge with eccentric hamstring slides 3x6 ?Manual Therapy: ?N/A ?Neuromuscular re-ed: ?N/A ?Therapeutic Activity: ?Re-administration of FOTO with pt education ?Re-assessment of objective measures with pt education on progress made in PT to date ?Modalities: ?N/A ?Self Care: ?N/A ? ? ?[place] Adult PT Treatment:                                                DATE: 08/01/2021 ?Therapeutic Exercise: ?Nu-step L6 x 6 min LE only, while collecting subjective information ?Mini-squat side step walkouts with 7# cable to thigh attachment x4 BIL ?Standing hip extension with 7# cable to ankle attachment 2x10 BIL ?Sidelying Lt IT band stretch x2 minutes ?Dead lift with 10# kettlebell in front of table 3x8 ?Supine 90/90 BIL hip isometric with handhold resistance 3x30sec ?Manual Therapy: ?N/A ?Neuromuscular re-ed: ?N/A ?Therapeutic Activity: ?N/A ?Modalities: ?N/A ?Self Care: ?N/A ? ? ?[place] Adult PT Treatment:                                                DATE: 07/25/2021 ?Therapeutic Exercise: ?Sidelying hip abdcution 2 x 10 ?Glute med stretchin 2 x  30 sec ?Manual Therapy: ?IASTM along the  glute med ? ?Trigger Point Dry-Needling  ?Treatment instructions: Expect mild to moderate muscle soreness. S/S of pneumothorax if dry needled over a lung field, and to seek immediate medical attention should they occur. Patient verbalized understanding of these instructions and education. ? ?Patient Consent Given: Yes ?Education handout provided: Yes ?Muscles treated: L glute med ?Electrical stimulation performed: Yes ?Parameters:  CPS L 20 x 8 min adjusting intermittently ?Treatment response/outcome: twistch response and lengthening of the muscle ? ?Neuromuscular re-ed: ?Marching on airex pad ?Modalities: ?Iontophoresis 4mg /ml 6 hour patch L glute med ? ? ? ? ? ? ?PATIENT EDUCATION:  ?Education details: re-evaluation findings, POC, goals. reviewed HEP and updated today  ?Person educated: Patient ?Education method: Explanation ?Education comprehension: verbalized understanding ? ? ?HOME EXERCISE PROGRAM: ?Access Code: [email or web address] ?URL: [email or web address] ?Date: 06/29/2021 ?Prepared by: Starr Lake ? ?Exercises ?- Hooklying Clamshell with Resistance  - 1 x daily - 7 x weekly - 2 sets - 10 reps ?- Supine Heel Slides (Mirrored)  - 1 x daily - 7 x weekly - 2 sets - 10 reps ?- Supine March  - 1 x daily - 7 x weekly - 2 sets - 10 reps - 5 hold ?- Supine Quad Set  - 1 x daily - 7 x weekly - 2 sets - 10 reps ?- Modified Thomas Stretch (Mirrored)  - 2 x daily - 7 x weekly - 2 sets - 2 reps - 30 seconds hold ?- Lateral Weight Shift with Parallel Bars (BKA)  - 1 x daily - 7 x weekly - 1 sets - 1 min hold ?- Standing Hip Abduction with Counter Support  - 1 x daily - 7 x weekly - 1 sets - 10 reps ?- SLR  - 1 x daily - 7 x weekly - 3 sets - 10 reps - 1 hold ?- Supine Quadriceps Stretch with Strap on Table  - 1 x daily - 7 x weekly - 3 sets - 30 seconds hold ?- Seated Hamstring Stretch  - 1 x daily - 7 x weekly - 2 sets - 2 reps - 30 hold ? ?Added 08/01/2021: ?- Sidelying ITB Stretch off Table  - 1 x daily - 7 x  weekly - 2-min hold ? ? ? ? ?ASSESSMENT: ? ?CLINICAL IMPRESSION: ?Upon re-assessment, the pt has made excellent progress in Lt hip AROM and strength, although she continues to be limited in her functional

## 2021-08-03 ENCOUNTER — Ambulatory Visit: Payer: Medicare Other

## 2021-08-03 DIAGNOSIS — M25552 Pain in left hip: Secondary | ICD-10-CM | POA: Diagnosis not present

## 2021-08-03 DIAGNOSIS — R2689 Other abnormalities of gait and mobility: Secondary | ICD-10-CM

## 2021-08-03 DIAGNOSIS — M6281 Muscle weakness (generalized): Secondary | ICD-10-CM

## 2021-08-15 ENCOUNTER — Ambulatory Visit: Payer: Medicare Other

## 2021-08-15 DIAGNOSIS — M25552 Pain in left hip: Secondary | ICD-10-CM

## 2021-08-15 DIAGNOSIS — R2689 Other abnormalities of gait and mobility: Secondary | ICD-10-CM

## 2021-08-15 DIAGNOSIS — M6281 Muscle weakness (generalized): Secondary | ICD-10-CM

## 2021-08-15 NOTE — Therapy (Signed)
?OUTPATIENT PHYSICAL THERAPY TREATMENT NOTE ? ? ? ?  ? ?Patient Name: Christine Roberson ?MRN: [id number] ?DOB:05/08/1965, 56 y.o., female ?Today's Date: 08/15/2021 ? ?PCP: Daleen Snook, NP ?REFERRING PROVIDER: Rosanne Ashing, MD ? ? PT End of Session - 08/15/21 1344   ? ? Visit Number 21   ? Number of Visits 25   ? Date for PT Re-Evaluation 08/24/21   ? Authorization Type UHC MCR, KX Modifier after v15   ? Progress Note Due on Visit 20   ? PT Start Time 1350   ? PT Stop Time 1430   ? PT Time Calculation (min) 40 min   ? Equipment Utilized During Treatment Gait belt   ? Activity Tolerance Patient tolerated treatment well   ? Behavior During Therapy [facility] for tasks assessed/performed   ? ?  ?  ? ?  ? ? ? ? ? ? ? ? ? ? ? ? ? ? ? ? ? ? ? ? ?Past Medical History:  ?Diagnosis Date  ? Arthritis   ? Chronic back pain   ? DDD (degenerative disc disease), lumbar   ? Headache   ? Hypertension   ? MVP (mitral valve prolapse)   ? ?Past Surgical History:  ?Procedure Laterality Date  ? ABDOMINAL HYSTERECTOMY    ? CLOSED MANIPULATION SHOULDER WITH STERIOD INJECTION Right 03/05/2017  ? Procedure: CLOSED MANIPULATION SHOULDER WITH STEROID INJECTION;  Surgeon: Renette Butters, MD;  Location: [place];  Service: Orthopedics;  Laterality: Right;  ? JOINT REPLACEMENT Right   ? TKR  ? KNEE ARTHROSCOPY Bilateral   ? SPINAL CORD STIMULATOR IMPLANT    ? SPINAL CORD STIMULATOR REMOVAL    ? TONSILLECTOMY    ? TOTAL HIP ARTHROPLASTY Right   ? ?There are no problems to display for this patient. ? ? 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 ?THERAPY DIAG:  ?Pain in left hip ? ?Muscle weakness (generalized) ? ?Other abnormalities of gait and mobility ? ?PERTINENT HISTORY: R THA , L THA 2/14 ? ?PRECAUTIONS: Posterior hip precautions ? ?SUBJECTIVE: Pt reports that she woke up yesterday and felt like she hurt her hip just by turning it in bed. However, she reports this pain has returned to baseline today. She  reports continued adherence to her HEP in addition to walking more. She reports getting 8,000-9,000 steps per day. ? ?PAIN:  ?Are you having pain? Yes: NPRS scale: 5/10 ?Pain location: outside of the hip ?Pain description: numbing ?Aggravating factors: standing/ walking ?Relieving factors: sitting down. ? ? ? ? ? ? ? ? ?OBJECTIVE:  ?*Unless otherwise noted by date, all objective measures were captured on initial evaluation.  ? ?DIAGNOSTIC FINDINGS:  ?MRI 01/18/2020 ?IMPRESSION: ?No acute abnormality. ?Degenerative tear of the anterior, superior right labrum. ? ?PATIENT SURVEYS:  ?FOTO 50%, predicted 70% ?  06/06/2021 41% ?  06/19/2021  55% limited ? ?COGNITION: ? Overall cognitive status: Within functional limits for tasks assessed   ?  ?SENSATION: ? Light touch: Appears intact ? ?POSTURE:  ?Forward head position ? ?PALPATION: ?TTP along the glute med/ and along the anterior aspect of the acetabulofemoral joint, along the proximal adductors, piriformis and proximal rectus femoris.  ? ?LE AROM/PROM: ? ?A/PROM Right ?04/18/2021 Left ?04/18/2021 Left  05/18/2021 Left ?08/03/2021  ?Hip flexion [facility] * *60/110 120/ 130p!  ?Hip extension [facility] WFL *    ?Hip abduction [facility] WFL * *32 in supine 45/ 50p!  ?Hip adduction [facility] WFL * ? *WFL   ?  Hip internal rotation      ?Hip external rotation      ?Knee flexion      ?Knee extension      ?Ankle dorsiflexion      ?Ankle plantarflexion      ?Ankle inversion      ?Ankle eversion      ?   (Blank rows = not tested, * pain during assessment) ? ?LE MMT: ? ?MMT Right ?04/18/2021 Left ?04/18/2021 Left  05/18/2021 Left ?06/19/2021 Right ?08/03/2021 Left ?08/03/2021  ?Hip flexion 4/5 4/-5* 3+/5 4/5 4+/5 4+/5  ?Hip extension 4-/5 3/5* 3+/5 4-/5 4+/5 4+/5  ?Hip abduction 4-/5 3+/5* 3+/5 4-/5 4+/5 5/5  ?Hip adduction 4-/5 4-/5* 4/5 4/5 5/5 5/5  ?Hip internal rotation        ?Hip external rotation        ?Knee flexion        ?Knee extension        ?Ankle dorsiflexion        ?Ankle plantarflexion        ?Ankle  inversion        ?Ankle eversion        ? (Blank rows = not tested, *pain during testing ) ? ?LOWER EXTREMITY SPECIAL TESTS:  ?N/A ? ?FUNCTIONAL TESTS:  ?5 times sit to stand: 18 seconds ?05/23/2021 - unable to perform ? ?08/01/2021: 5xSTS: 17 seconds ? ?GAIT: ?Assistive device utilized: RW ?Level of assistance: Complete Independence ?Comments: antalgic gait with decreaesd stance on the LLE, and stride on the RLE ? ? ?TODAY'S TREATMENT: ? ?[place] Adult PT Treatment:                                                DATE: 08/15/2021 ?Therapeutic Exercise: ?Nu-step L10 x 6 min LE only, while collecting subjective information ?Sidelying IT band stretch x2 minutes BIL ?Sidelying hip abduction 2x10 with 3-sec hold BIL ?Bridge Isometric with alternating marching 3x20 ?DKTC strecth 3x30sec ?Squat to table with subsequent side-step x3 down and back length of table ?Seated BIL hip IR with RTB 2x10 ?Manual Therapy: ?N/A ?Neuromuscular re-ed: ?N/A ?Therapeutic Activity: ?N/A ?Modalities: ?N/A ?Self Care: ?N/A ? ? ?[place] Adult PT Treatment:                                                DATE: 08/03/2021 ?Therapeutic Exercise: ?Nu-step L6 x 6 min LE only, while collecting subjective information ?Supine 90/90 BIL hip isometric with handhold resistance 3x30sec ?Prone alternating hip extension 3x20 with 3-sec hold BIL ?Squat while holding two 7# cables at shoulder level 3x8 ?Bridge with eccentric hamstring slides 3x6 ?Manual Therapy: ?N/A ?Neuromuscular re-ed: ?N/A ?Therapeutic Activity: ?Re-administration of FOTO with pt education ?Re-assessment of objective measures with pt education on progress made in PT to date ?Modalities: ?N/A ?Self Care: ?N/A ? ? ?[place] Adult PT Treatment:                                                DATE: 08/01/2021 ?Therapeutic Exercise: ?Nu-step L6 x 6 min LE only, while collecting subjective information ?Mini-squat side step walkouts with  7# cable to thigh attachment x4 BIL ?Standing hip extension with 7# cable to  ankle attachment 2x10 BIL ?Sidelying Lt IT band stretch x2 minutes ?Dead lift with 10# kettlebell in front of table 3x8 ?Supine 90/90 BIL hip isometric with handhold resistance 3x30sec ?Manual Therapy: ?N/A ?Neuromuscular re-ed: ?N/A ?Therapeutic Activity: ?N/A ?Modalities: ?N/A ?Self Care: ?N/A ? ? ? ? ? ?PATIENT EDUCATION:  ?Education details: re-evaluation findings, POC, goals. reviewed HEP and updated today  ?Person educated: Patient ?Education method: Explanation ?Education comprehension: verbalized understanding ? ? ?HOME EXERCISE PROGRAM: ?Access Code: [email or web address] ?URL: [email or web address] ?Date: 06/29/2021 ?Prepared by: Starr Lake ? ?Exercises ?- Hooklying Clamshell with Resistance  - 1 x daily - 7 x weekly - 2 sets - 10 reps ?- Supine Heel Slides (Mirrored)  - 1 x daily - 7 x weekly - 2 sets - 10 reps ?- Supine March  - 1 x daily - 7 x weekly - 2 sets - 10 reps - 5 hold ?- Supine Quad Set  - 1 x daily - 7 x weekly - 2 sets - 10 reps ?- Modified Thomas Stretch (Mirrored)  - 2 x daily - 7 x weekly - 2 sets - 2 reps - 30 seconds hold ?- Lateral Weight Shift with Parallel Bars (BKA)  - 1 x daily - 7 x weekly - 1 sets - 1 min hold ?- Standing Hip Abduction with Counter Support  - 1 x daily - 7 x weekly - 1 sets - 10 reps ?- SLR  - 1 x daily - 7 x weekly - 3 sets - 10 reps - 1 hold ?- Supine Quadriceps Stretch with Strap on Table  - 1 x daily - 7 x weekly - 3 sets - 30 seconds hold ?- Seated Hamstring Stretch  - 1 x daily - 7 x weekly - 2 sets - 2 reps - 30 hold ? ?Added 08/01/2021: ?- Sidelying ITB Stretch off Table  - 1 x daily - 7 x weekly - 2-min hold ? ? ? ? ?ASSESSMENT: ? ?CLINICAL IMPRESSION: ?Pt responded well to all interventions today, demonstrating good form and no increase in baseline pain with selected exercises. Again, she reports therapeutic response to IT band stretches. She will continue to benefit from skilled PT to address her primary impairments and return to her prior level  of function with less limitation. ? ?IMPAIRMENTS/ DEFICITS: Objective impairments include Abnormal gait, decreased activity tolerance, decreased balance, decreased endurance, difficulty walking, decreas

## 2021-08-16 NOTE — Therapy (Signed)
OUTPATIENT PHYSICAL THERAPY TREATMENT NOTE       Patient Name: Christine Roberson MRN: [id number] DOB:January 10, 1966, 56 y.o., female Today's Date: 08/17/2021  PCP: Daleen Snook, NP REFERRING PROVIDER: Daleen Snook, NP   PT End of Session - 08/17/21 1256     Visit Number 22    Number of Visits 25    Date for PT Re-Evaluation 08/24/21    Authorization Type UHC MCR, [place] [employer] after v15    Progress Note Due on Visit 20    PT Start Time 1300    PT Stop Time 1342    PT Time Calculation (min) 42 min    Equipment Utilized During Treatment Gait belt    Activity Tolerance Patient tolerated treatment well    Behavior During Therapy WFL for tasks assessed/performed                                Past Medical History:  Diagnosis Date   Arthritis    Chronic back pain    DDD (degenerative disc disease), lumbar    Headache    Hypertension    MVP (mitral valve prolapse)    Past Surgical History:  Procedure Laterality Date   ABDOMINAL HYSTERECTOMY     CLOSED MANIPULATION SHOULDER WITH STERIOD INJECTION Right 03/05/2017   Procedure: CLOSED MANIPULATION SHOULDER WITH STEROID INJECTION;  Surgeon: Renette Butters, MD;  Location: [place];  Service: Orthopedics;  Laterality: Right;   JOINT REPLACEMENT Right    TKR   KNEE ARTHROSCOPY Bilateral    SPINAL CORD STIMULATOR IMPLANT     SPINAL CORD STIMULATOR REMOVAL     TONSILLECTOMY     TOTAL HIP ARTHROPLASTY Right    There are no problems to display for this patient.   REFERRING DIAG: Unilateral primary osteoarthritis, left hip [M16.12]  THERAPY DIAG:  Pain in left hip  Muscle weakness (generalized)  Other abnormalities of gait and mobility  PERTINENT HISTORY: R THA , L THA 2/14  PRECAUTIONS: Posterior hip precautions  SUBJECTIVE: Pt reports increased Lt posterolateral hip pain today rated 6/10. She reports continued adherence to her HEP.  PAIN:  Are you having pain? Yes: NPRS scale:  6/10 Pain location: outside of the hip Pain description: numbing Aggravating factors: standing/ walking Relieving factors: sitting down.         OBJECTIVE:  *Unless otherwise noted by date, all objective measures were captured on initial evaluation.   DIAGNOSTIC FINDINGS:  MRI 01/18/2020 IMPRESSION: No acute abnormality. Degenerative tear of the anterior, superior right labrum.  PATIENT SURVEYS:  FOTO 50%, predicted 70%   06/06/2021 41%   06/19/2021  55% limited  COGNITION:  Overall cognitive status: Within functional limits for tasks assessed     SENSATION:  Light touch: Appears intact  POSTURE:  Forward head position  PALPATION: TTP along the glute med/ and along the anterior aspect of the acetabulofemoral joint, along the proximal adductors, piriformis and proximal rectus femoris.   LE AROM/PROM:  A/PROM Right 04/18/2021 Left 04/18/2021 Left  05/18/2021 Left 08/03/2021  Hip flexion [facility] WFL * *60/110 120/ 130p!  Hip extension [facility] WFL *    Hip abduction [facility] WFL * *32 in supine 45/ 50p!  Hip adduction WFL WFL *  *WFL   Hip internal rotation      Hip external rotation      Knee flexion      Knee extension      Ankle  dorsiflexion      Ankle plantarflexion      Ankle inversion      Ankle eversion         (Blank rows = not tested, * pain during assessment)  LE MMT:  MMT Right 04/18/2021 Left 04/18/2021 Left  05/18/2021 Left 06/19/2021 Right 08/03/2021 Left 08/03/2021  Hip flexion 4/5 4/-5* 3+/5 4/5 4+/5 4+/5  Hip extension 4-/5 3/5* 3+/5 4-/5 4+/5 4+/5  Hip abduction 4-/5 3+/5* 3+/5 4-/5 4+/5 5/5  Hip adduction 4-/5 4-/5* 4/5 4/5 5/5 5/5  Hip internal rotation        Hip external rotation        Knee flexion        Knee extension        Ankle dorsiflexion        Ankle plantarflexion        Ankle inversion        Ankle eversion         (Blank rows = not tested, *pain during testing )  LOWER EXTREMITY SPECIAL TESTS:  N/A  FUNCTIONAL TESTS:  5 times sit  to stand: 18 seconds 05/23/2021 - unable to perform  08/01/2021: 5xSTS: 17 seconds  GAIT: Assistive device utilized: RW Level of assistance: Complete Independence Comments: antalgic gait with decreaesd stance on the LLE, and stride on the RLE   TODAY'S TREATMENT:  [facility] Adult PT Treatment:                                                DATE: 08/17/2021 Therapeutic Exercise: Nu-step L10 x 6 min while collecting subjective information Mini-squat side stepping on treadmill at 0.2 mph x3 min BIL with UE support Bridge Isometric with alternating marching and Rtb resistance around thighs 3x20 Standing hip extension with 7# cable to ankle attachment on FreeMotion machine 2x10 BIL Standing hip abduction with 7# cable to ankle attachment on FreeMotion machine 2x10 BIL Squat to table with subsequent side-step x3 down and back length of table with RTB around thighs Seated BIL hip IR with RTB 2x10 with 3-sec holds Manual Therapy: N/A Neuromuscular re-ed: N/A Therapeutic Activity: N/A Modalities: N/A Self Care: N/A   [facility] Adult PT Treatment:                                                DATE: 08/15/2021 Therapeutic Exercise: Nu-step L10 x 6 min LE only, while collecting subjective information Sidelying IT band stretch x2 minutes BIL Sidelying hip abduction 2x10 with 3-sec hold BIL Bridge Isometric with alternating marching 3x20 DKTC strecth 3x30sec Squat to table with subsequent side-step x3 down and back length of table Seated BIL hip IR with RTB 2x10 Manual Therapy: N/A Neuromuscular re-ed: N/A Therapeutic Activity: N/A Modalities: N/A Self Care: N/A   [facility] Adult PT Treatment:                                                DATE: 08/03/2021 Therapeutic Exercise: Nu-step L6 x 6 min LE only, while collecting subjective information Supine 90/90 BIL hip isometric with handhold resistance 3x30sec Prone alternating  hip extension 3x20 with 3-sec hold BIL Squat while holding two 7#  cables at shoulder level 3x8 Bridge with eccentric hamstring slides 3x6 Manual Therapy: N/A Neuromuscular re-ed: N/A Therapeutic Activity: Re-administration of FOTO with pt education Re-assessment of objective measures with pt education on progress made in PT to date Modalities: N/A Self Care: N/A      PATIENT EDUCATION:  Education details: re-evaluation findings, POC, goals. reviewed HEP and updated today  Person educated: Patient Education method: Explanation Education comprehension: verbalized understanding   HOME EXERCISE PROGRAM: Access Code: [email or web address] URL: [email or web address] Date: 06/29/2021 Prepared by: Starr Lake  Exercises - Hooklying Clamshell with Resistance  - 1 x daily - 7 x weekly - 2 sets - 10 reps - Supine Heel Slides (Mirrored)  - 1 x daily - 7 x weekly - 2 sets - 10 reps - Supine March  - 1 x daily - 7 x weekly - 2 sets - 10 reps - 5 hold - Supine Quad Set  - 1 x daily - 7 x weekly - 2 sets - 10 reps - Modified Thomas Stretch (Mirrored)  - 2 x daily - 7 x weekly - 2 sets - 2 reps - 30 seconds hold - Lateral Weight Shift with Parallel Bars (BKA)  - 1 x daily - 7 x weekly - 1 sets - 1 min hold - Standing Hip Abduction with Counter Support  - 1 x daily - 7 x weekly - 1 sets - 10 reps - SLR  - 1 x daily - 7 x weekly - 3 sets - 10 reps - 1 hold - Supine Quadriceps Stretch with Strap on Table  - 1 x daily - 7 x weekly - 3 sets - 30 seconds hold - Seated Hamstring Stretch  - 1 x daily - 7 x weekly - 2 sets - 2 reps - 30 hold  Added 08/01/2021: - Sidelying ITB Stretch off Table  - 1 x daily - 7 x weekly - 2-min hold     ASSESSMENT:  CLINICAL IMPRESSION: Pt responded well to all interventions today, demonstrating good form and no increase in pain with all exercises, except side stepping on treadmill, which she reports increased her pain mildly. She will continue to benefit from skilled PT to address her primary impairments and return  to her prior level of function with less limitation.  IMPAIRMENTS/ DEFICITS: Objective impairments include Abnormal gait, decreased activity tolerance, decreased balance, decreased endurance, difficulty walking, decreased strength, increased edema, increased muscle spasms, postural dysfunction, and pain.  REHAB POTENTIAL: Good  CLINICAL DECISION MAKING: Stable/uncomplicated  EVALUATION COMPLEXITY: Low   GOALS:  SHORT TERM GOALS:  STG Name Target Date Goal status  1 Pt to be IND with initial HEP to progress PT  Baseline:  07/04/2021  Met  06/19/2021  2 Pt to be able to walk and stand for >/= 15 min for progression of endurance 07/04/2021   Met 06/19/2021  3 Increase L hip flexoin to >/= 90 degrees actively with </= 5/10 pain max for progression of hip mobility  07/04/2021  Met 06/19/2021   LONG TERM GOALS:   LTG Name Target Date Goal status  1 Increase L hip gross strength to >/= 4/5 to promote hip stability and maximize safety  Baseline: 08/03/2021: 4+/5 or better globally 07/18/2021  ACHIEVED 08/03/2021  2 Increase L hip AROM WFL compared bil to assist with general mobility required for ADLS with </= max 9/52 pain Baseline: 08/03/2021: Pt has achieved WNL Lt hip  AROM with no pain 07/18/2021 ACHIEVED 08/03/2021  3 Pt to be able to stand/ walk for >/= 30 min with LRAD demonstrating heel strike/ toe off pattern with min deviation for functional endurance for in home amb and short community distances Baseline: 08/03/2021: Pt reports ability to walk about 1 hour with a [facility] 07/18/2021 ACHIEVED   4 Pt to increase FOTO score to >/= 70% to demo improvement in function Baseline: 08/03/2021: 54% 07/18/2021 Ongoing 08/03/2021  4 Pt to be able to perform 5 x sit to stand </= 15 seconds to demo improvement in function Baseline: at eval 18 seconds, at re-eval unable to perform 08/01/2021: 17 seconds 07/18/2021 Ongoing 08/03/2021  5 Pt to be IND with all HEP to maintain and progress curreht LOF IND 08/03/2021:  Pt reports daily adherence to her HEP 07/18/2021 ACHIEVED 08/03/2021   PLAN: PT FREQUENCY: 2x/week  PT DURATION: 6 weeks  PLANNED INTERVENTIONS: Therapeutic exercises, Therapeutic activity, Neuro Muscular re-education, Balance training, Gait training, Patient/Family education, Joint mobilization, Stair training, DME instructions, Dry Needling, Electrical stimulation, Cryotherapy, Moist heat, Taping, Ionotophoresis 11m/ml Dexamethasone, and Manual therapy  PLAN FOR NEXT SESSION: Review/ update HEP PRN. Posterior hip precautions, Hip ROM, gross strengthening,  Stair negotiation.  Continue with gentle hip stretches and strengthening per patient's tolerance.  [facility]. Response to iontophoresis and desensitization techniques. Response to DN.   YVanessa Dassel PT, DPT 08/17/21 1:42 PM

## 2021-08-17 ENCOUNTER — Ambulatory Visit: Payer: Medicare Other

## 2021-08-17 DIAGNOSIS — M6281 Muscle weakness (generalized): Secondary | ICD-10-CM

## 2021-08-17 DIAGNOSIS — R2689 Other abnormalities of gait and mobility: Secondary | ICD-10-CM

## 2021-08-17 DIAGNOSIS — M25552 Pain in left hip: Secondary | ICD-10-CM | POA: Diagnosis not present

## 2021-08-22 ENCOUNTER — Encounter: Payer: Self-pay | Admitting: Physical Therapy

## 2021-08-22 ENCOUNTER — Ambulatory Visit: Payer: Medicare Other | Admitting: Physical Therapy

## 2021-08-22 DIAGNOSIS — R2689 Other abnormalities of gait and mobility: Secondary | ICD-10-CM

## 2021-08-22 DIAGNOSIS — M6281 Muscle weakness (generalized): Secondary | ICD-10-CM

## 2021-08-22 DIAGNOSIS — M25552 Pain in left hip: Secondary | ICD-10-CM | POA: Diagnosis not present

## 2021-08-22 NOTE — Therapy (Signed)
OUTPATIENT PHYSICAL THERAPY TREATMENT NOTE       Patient Name: Christine Roberson MRN: [id number] DOB:Sep 22, 1965, 56 y.o., female Today's Date: 08/22/2021  PCP: Daleen Snook, NP REFERRING PROVIDER: Rosanne Ashing, MD   PT End of Session - 08/22/21 1423     Visit Number 23    Number of Visits 25    Date for PT Re-Evaluation 08/24/21    Authorization Type UHC MCR, [place] after v15    Progress Note Due on Visit 54    PT Start Time 1417    PT Stop Time 1500    PT Time Calculation (min) 43 min    Activity Tolerance Patient tolerated treatment well    Behavior During Therapy WFL for tasks assessed/performed                                 Past Medical History:  Diagnosis Date   Arthritis    Chronic back pain    DDD (degenerative disc disease), lumbar    Headache    Hypertension    MVP (mitral valve prolapse)    Past Surgical History:  Procedure Laterality Date   ABDOMINAL HYSTERECTOMY     CLOSED MANIPULATION SHOULDER WITH STERIOD INJECTION Right 03/05/2017   Procedure: CLOSED MANIPULATION SHOULDER WITH STEROID INJECTION;  Surgeon: Renette Butters, MD;  Location: [place];  Service: Orthopedics;  Laterality: Right;   JOINT REPLACEMENT Right    TKR   KNEE ARTHROSCOPY Bilateral    SPINAL CORD STIMULATOR IMPLANT     SPINAL CORD STIMULATOR REMOVAL     TONSILLECTOMY     TOTAL HIP ARTHROPLASTY Right    There are no problems to display for this patient.   REFERRING DIAG: Unilateral primary osteoarthritis, left hip [M16.12]  THERAPY DIAG:  Pain in left hip  Muscle weakness (generalized)  Other abnormalities of gait and mobility  PERTINENT HISTORY: R THA , L THA 2/14  PRECAUTIONS: Posterior hip precautions  SUBJECTIVE: "I am doing better in the hip but it did give me a fit the other day. 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 Yes: NPRS scale: 5/10 Pain location: outside of the hip Pain description: numbing Aggravating factors: standing/ walking Relieving factors: sitting down.         OBJECTIVE:  *Unless otherwise noted by date, all objective measures were captured on initial evaluation.   DIAGNOSTIC FINDINGS:  MRI 01/18/2020 IMPRESSION: No acute abnormality. Degenerative tear of the anterior, superior right labrum.  PATIENT SURVEYS:  FOTO 50%, predicted 70%   06/06/2021 41%   06/19/2021  55% limited  COGNITION:  Overall cognitive status: Within functional limits for tasks assessed     SENSATION:  Light touch: Appears intact  POSTURE:  Forward head position  PALPATION: TTP along the glute med/ and along the anterior aspect of the acetabulofemoral joint, along the proximal adductors, piriformis and proximal rectus femoris.   LE AROM/PROM:  A/PROM Right 04/18/2021 Left 04/18/2021 Left  05/18/2021 Left 08/03/2021  Hip flexion [facility] WFL * *60/110 120/ 130p!  Hip extension [facility] WFL *    Hip abduction [facility] WFL * *32 in supine 45/ 50p!  Hip adduction WFL WFL *  *WFL   Hip internal rotation      Hip external rotation  Knee flexion      Knee extension      Ankle dorsiflexion      Ankle plantarflexion      Ankle inversion      Ankle eversion         (Blank rows = not tested, * pain during assessment)  LE MMT:  MMT Right 04/18/2021 Left 04/18/2021 Left  05/18/2021 Left 06/19/2021 Right 08/03/2021 Left 08/03/2021  Hip flexion 4/5 4/-5* 3+/5 4/5 4+/5 4+/5  Hip extension 4-/5 3/5* 3+/5 4-/5 4+/5 4+/5  Hip abduction 4-/5 3+/5* 3+/5 4-/5 4+/5 5/5  Hip adduction 4-/5 4-/5* 4/5 4/5 5/5 5/5  Hip internal rotation        Hip external rotation        Knee flexion        Knee extension        Ankle dorsiflexion        Ankle plantarflexion        Ankle inversion        Ankle eversion         (Blank rows = not tested, *pain during testing )  LOWER EXTREMITY SPECIAL TESTS:   N/A  FUNCTIONAL TESTS:  5 times sit to stand: 18 seconds 05/23/2021 - unable to perform  08/01/2021: 5xSTS: 17 seconds  GAIT: Assistive device utilized: RW Level of assistance: Complete [place] Comments: antalgic gait with decreaesd stance on the LLE, and stride on the RLE   TODAY'S TREATMENT:  [facility] Adult PT Treatment:                                                DATE: 08/22/2021 Therapeutic Exercise: ITB stretch 2 x 30 sec on R sidelying L hip abduction in R Sidelying 2 x 15 Nu-step L5 x 5 min L hip SL bridge going to fatigue - progressing to double leg bridge going to fatigue with RTB around knees for added glute med activation going to fatigue.  Manual Therapy: IASTM along the glute med/ piriformis LLE LAD grade III  Trigger Point Dry-Needling  Treatment instructions: Expect mild to moderate muscle soreness. S/S of pneumothorax if dry needled over a lung field, and to seek immediate medical attention should they occur. Patient verbalized understanding of these instructions and education.  Patient Consent Given: Yes Education handout provided: Previously provided Muscles treated: L glute med/ piriformis Electrical stimulation performed: No Parameters: N/A Treatment response/outcome: twitch response and muscle lengthening.     [facility] Adult PT Treatment:                                                DATE: 08/17/2021 Therapeutic Exercise: Nu-step L10 x 6 min while collecting subjective information Mini-squat side stepping on treadmill at 0.2 mph x3 min BIL with UE support Bridge Isometric with alternating marching and Rtb resistance around thighs 3x20 Standing hip extension with 7# cable to ankle attachment on FreeMotion machine 2x10 BIL Standing hip abduction with 7# cable to ankle attachment on FreeMotion machine 2x10 BIL Squat to table with subsequent side-step x3 down and back length of table with RTB around thighs Seated BIL hip IR with RTB 2x10 with 3-sec  holds   [facility] Adult PT Treatment:  DATE: 08/15/2021 Therapeutic Exercise: Nu-step L10 x 6 min LE only, while collecting subjective information Sidelying IT band stretch x2 minutes BIL Sidelying hip abduction 2x10 with 3-sec hold BIL Bridge Isometric with alternating marching 3x20 DKTC strecth 3x30sec Squat to table with subsequent side-step x3 down and back length of table Seated BIL hip IR with RTB 2x10 Manual Therapy: N/A Neuromuscular re-ed: N/A Therapeutic Activity: N/A Modalities: N/A Self Care: N/A   PATIENT EDUCATION:  Education details: re-evaluation findings, POC, goals. reviewed HEP and updated today  Person educated: Patient Education method: Explanation Education comprehension: verbalized understanding   HOME EXERCISE PROGRAM: Access Code: [email or web address] URL: [email or web address] Date: 06/29/2021 Prepared by: Starr Lake  Exercises - Hooklying Clamshell with Resistance  - 1 x daily - 7 x weekly - 2 sets - 10 reps - Supine Heel Slides (Mirrored)  - 1 x daily - 7 x weekly - 2 sets - 10 reps - Supine March  - 1 x daily - 7 x weekly - 2 sets - 10 reps - 5 hold - Supine Quad Set  - 1 x daily - 7 x weekly - 2 sets - 10 reps - Modified Thomas Stretch (Mirrored)  - 2 x daily - 7 x weekly - 2 sets - 2 reps - 30 seconds hold - Lateral Weight Shift with Parallel Bars (BKA)  - 1 x daily - 7 x weekly - 1 sets - 1 min hold - Standing Hip Abduction with Counter Support  - 1 x daily - 7 x weekly - 1 sets - 10 reps - SLR  - 1 x daily - 7 x weekly - 3 sets - 10 reps - 1 hold - Supine Quadriceps Stretch with Strap on Table  - 1 x daily - 7 x weekly - 3 sets - 30 seconds hold - Seated Hamstring Stretch  - 1 x daily - 7 x weekly - 2 sets - 2 reps - 30 hold  Added 08/01/2021: - Sidelying ITB Stretch off Table  - 1 x daily - 7 x weekly - 2-min hold     ASSESSMENT:  CLINICAL IMPRESSION: Mrs Steffensmeier reportes 5/10 pain  in the hip but notes she is having more issue with her feet noting it is 10/10 due a hx of having erythormyalgia. Continued TPDN focusing on the piriformis and glute med. Continued focus on strengthening which pt is able to complete however, continues to have elevated intermittent pain that occurs randomly and can jump up to 8-9/10. Discussed returning back to the MD for further assessment.   IMPAIRMENTS/ DEFICITS: Objective impairments include Abnormal gait, decreased activity tolerance, decreased balance, decreased endurance, difficulty walking, decreased strength, increased edema, increased muscle spasms, postural dysfunction, and pain.  REHAB POTENTIAL: Good  CLINICAL DECISION MAKING: Stable/uncomplicated  EVALUATION COMPLEXITY: Low   GOALS:  SHORT TERM GOALS:  STG Name Target Date Goal status  1 Pt to be IND with initial HEP to progress PT  Baseline:  07/04/2021  Met  06/19/2021  2 Pt to be able to walk and stand for >/= 15 min for progression of endurance 07/04/2021   Met 06/19/2021  3 Increase L hip flexoin to >/= 90 degrees actively with </= 5/10 pain max for progression of hip mobility  07/04/2021  Met 06/19/2021   LONG TERM GOALS:   LTG Name Target Date Goal status  1 Increase L hip gross strength to >/= 4/5 to promote hip stability and maximize safety  Baseline: 08/03/2021: 4+/5 or better globally 07/18/2021  ACHIEVED 08/03/2021  2 Increase L hip AROM WFL compared bil to assist with general mobility required for ADLS with </= max 6/56 pain Baseline: 08/03/2021: Pt has achieved WNL Lt hip AROM with no pain 07/18/2021 ACHIEVED 08/03/2021  3 Pt to be able to stand/ walk for >/= 30 min with LRAD demonstrating heel strike/ toe off pattern with min deviation for functional endurance for in home amb and short community distances Baseline: 08/03/2021: Pt reports ability to walk about 1 hour with a [facility] 07/18/2021 ACHIEVED   4 Pt to increase FOTO score to >/= 70% to demo improvement in  function Baseline: 08/03/2021: 54% 07/18/2021 Ongoing 08/03/2021  4 Pt to be able to perform 5 x sit to stand </= 15 seconds to demo improvement in function Baseline: at eval 18 seconds, at re-eval unable to perform 08/01/2021: 17 seconds 07/18/2021 Ongoing 08/03/2021  5 Pt to be IND with all HEP to maintain and progress curreht LOF IND 08/03/2021: Pt reports daily adherence to her HEP 07/18/2021 ACHIEVED 08/03/2021   PLAN: PT FREQUENCY: 2x/week  PT DURATION: 6 weeks  PLANNED INTERVENTIONS: Therapeutic exercises, Therapeutic activity, Neuro Muscular re-education, Balance training, Gait training, Patient/Family education, Joint mobilization, Stair training, DME instructions, Dry Needling, Electrical stimulation, Cryotherapy, Moist heat, Taping, Ionotophoresis 46m/ml Dexamethasone, and Manual therapy  PLAN FOR NEXT SESSION: Review/ update HEP PRN.  ERO next session Posterior hip precautions, Hip ROM, gross strengthening,  Stair negotiation.  Continue with gentle hip stretches and strengthening per patient's tolerance.  [facility]. Response to iontophoresis and desensitization techniques. Response to DN.   Batu Cassin PT, DPT, LAT, ATC  08/22/21  3:03 PM

## 2021-08-24 ENCOUNTER — Ambulatory Visit: Payer: Medicare Other

## 2021-08-29 ENCOUNTER — Ambulatory Visit: Payer: Medicare Other

## 2021-09-07 ENCOUNTER — Encounter: Payer: Medicare Other | Admitting: Physical Therapy

## 2021-09-08 ENCOUNTER — Telehealth: Payer: Self-pay

## 2021-09-08 ENCOUNTER — Ambulatory Visit: Payer: Medicare Other | Attending: Adult Reconstructive Orthopaedic Surgery

## 2021-09-08 DIAGNOSIS — M6281 Muscle weakness (generalized): Secondary | ICD-10-CM | POA: Insufficient documentation

## 2021-09-08 DIAGNOSIS — M25552 Pain in left hip: Secondary | ICD-10-CM | POA: Insufficient documentation

## 2021-09-08 DIAGNOSIS — R2689 Other abnormalities of gait and mobility: Secondary | ICD-10-CM | POA: Insufficient documentation

## 2021-09-08 NOTE — Therapy (Incomplete)
OUTPATIENT PHYSICAL THERAPY TREATMENT NOTE       Patient Name: Christine Roberson MRN: [id number] DOB:06/07/1965, 56 y.o., female Today's Date: 09/08/2021  PCP: Daleen Snook, NP REFERRING PROVIDER: Daleen Snook, NP                         Past Medical History:  Diagnosis Date   Arthritis    Chronic back pain    DDD (degenerative disc disease), lumbar    Headache    Hypertension    MVP (mitral valve prolapse)    Past Surgical History:  Procedure Laterality Date   ABDOMINAL HYSTERECTOMY     CLOSED MANIPULATION SHOULDER WITH STERIOD INJECTION Right 03/05/2017   Procedure: CLOSED MANIPULATION SHOULDER WITH STEROID INJECTION;  Surgeon: Renette Butters, MD;  Location: [place];  Service: Orthopedics;  Laterality: Right;   JOINT REPLACEMENT Right    TKR   KNEE ARTHROSCOPY Bilateral    SPINAL CORD STIMULATOR IMPLANT     SPINAL CORD STIMULATOR REMOVAL     TONSILLECTOMY     TOTAL HIP ARTHROPLASTY Right    There are no problems to display for this patient.   REFERRING DIAG: Unilateral primary osteoarthritis, left hip [M16.12]  THERAPY DIAG:  No diagnosis found.  PERTINENT HISTORY: R THA , L THA 2/14  PRECAUTIONS: Posterior hip precautions  SUBJECTIVE: ***  PAIN:  Are you having pain? Yes: NPRS scale: 5/10 Pain location: outside of the hip Pain description: numbing Aggravating factors: standing/ walking Relieving factors: sitting down.      OBJECTIVE:  *Unless otherwise noted by date, all objective measures were captured on initial evaluation.   DIAGNOSTIC FINDINGS:  MRI 01/18/2020 IMPRESSION: No acute abnormality. Degenerative tear of the anterior, superior right labrum.  PATIENT SURVEYS:  FOTO 50%, predicted 70%   06/06/2021 41%   06/19/2021  55% limited  COGNITION:  Overall cognitive status: Within functional limits for tasks assessed     SENSATION:  Light touch: Appears intact  POSTURE:  Forward head  position  PALPATION: TTP along the glute med/ and along the anterior aspect of the acetabulofemoral joint, along the proximal adductors, piriformis and proximal rectus femoris.   LE AROM/PROM:  A/PROM Right 04/18/2021 Left 04/18/2021 Left  05/18/2021 Left 08/03/2021  Hip flexion [facility] WFL * *60/110 120/ 130p!  Hip extension [facility] WFL *    Hip abduction [facility] WFL * *32 in supine 45/ 50p!  Hip adduction WFL WFL *  *WFL   Hip internal rotation      Hip external rotation      Knee flexion      Knee extension      Ankle dorsiflexion      Ankle plantarflexion      Ankle inversion      Ankle eversion         (Blank rows = not tested, * pain during assessment)  LE MMT:  MMT Right 04/18/2021 Left 04/18/2021 Left  05/18/2021 Left 06/19/2021 Right 08/03/2021 Left 08/03/2021  Hip flexion 4/5 4/-5* 3+/5 4/5 4+/5 4+/5  Hip extension 4-/5 3/5* 3+/5 4-/5 4+/5 4+/5  Hip abduction 4-/5 3+/5* 3+/5 4-/5 4+/5 5/5  Hip adduction 4-/5 4-/5* 4/5 4/5 5/5 5/5  Hip internal rotation        Hip external rotation        Knee flexion        Knee extension        Ankle dorsiflexion  Ankle plantarflexion        Ankle inversion        Ankle eversion         (Blank rows = not tested, *pain during testing )  LOWER EXTREMITY SPECIAL TESTS:  N/A  FUNCTIONAL TESTS:  5 times sit to stand: 18 seconds 05/23/2021 - unable to perform  08/01/2021: 5xSTS: 17 seconds  GAIT: Assistive device utilized: RW Level of assistance: Complete [place] Comments: antalgic gait with decreaesd stance on the LLE, and stride on the RLE   TODAY'S TREATMENT:  [facility] Adult PT Treatment:                                                DATE: 09/08/2021 Therapeutic Exercise: *** Manual Therapy: *** Neuromuscular re-ed: *** Therapeutic Activity: *** Modalities: *** Self Care: ***   [facility] Adult PT Treatment:                                                DATE: 08/22/2021 Therapeutic Exercise: ITB stretch 2 x 30 sec on R  sidelying L hip abduction in R Sidelying 2 x 15 Nu-step L5 x 5 min L hip SL bridge going to fatigue - progressing to double leg bridge going to fatigue with RTB around knees for added glute med activation going to fatigue.  Manual Therapy: IASTM along the glute med/ piriformis LLE LAD grade III  Trigger Point Dry-Needling  Treatment instructions: Expect mild to moderate muscle soreness. S/S of pneumothorax if dry needled over a lung field, and to seek immediate medical attention should they occur. Patient verbalized understanding of these instructions and education.  Patient Consent Given: Yes Education handout provided: Previously provided Muscles treated: L glute med/ piriformis Electrical stimulation performed: No Parameters: N/A Treatment response/outcome: twitch response and muscle lengthening.     [facility] Adult PT Treatment:                                                DATE: 08/17/2021 Therapeutic Exercise: Nu-step L10 x 6 min while collecting subjective information Mini-squat side stepping on treadmill at 0.2 mph x3 min BIL with UE support Bridge Isometric with alternating marching and Rtb resistance around thighs 3x20 Standing hip extension with 7# cable to ankle attachment on FreeMotion machine 2x10 BIL Standing hip abduction with 7# cable to ankle attachment on FreeMotion machine 2x10 BIL Squat to table with subsequent side-step x3 down and back length of table with RTB around thighs Seated BIL hip IR with RTB 2x10 with 3-sec holds     PATIENT EDUCATION:  Education details: re-evaluation findings, POC, goals. reviewed HEP and updated today  Person educated: Patient Education method: Explanation Education comprehension: verbalized understanding   HOME EXERCISE PROGRAM: Access Code: [email or web address] URL: [email or web address] Date: 06/29/2021 Prepared by: Starr Lake  Exercises - Hooklying Clamshell with Resistance  - 1 x daily - 7 x weekly - 2 sets -  10 reps - Supine Heel Slides (Mirrored)  - 1 x daily - 7 x weekly - 2 sets - 10 reps - Supine March  - 1 x daily -  7 x weekly - 2 sets - 10 reps - 5 hold - Supine Quad Set  - 1 x daily - 7 x weekly - 2 sets - 10 reps - Modified Thomas Stretch (Mirrored)  - 2 x daily - 7 x weekly - 2 sets - 2 reps - 30 seconds hold - Lateral Weight Shift with Parallel Bars (BKA)  - 1 x daily - 7 x weekly - 1 sets - 1 min hold - Standing Hip Abduction with Counter Support  - 1 x daily - 7 x weekly - 1 sets - 10 reps - SLR  - 1 x daily - 7 x weekly - 3 sets - 10 reps - 1 hold - Supine Quadriceps Stretch with Strap on Table  - 1 x daily - 7 x weekly - 3 sets - 30 seconds hold - Seated Hamstring Stretch  - 1 x daily - 7 x weekly - 2 sets - 2 reps - 30 hold  Added 08/01/2021: - Sidelying ITB Stretch off Table  - 1 x daily - 7 x weekly - 2-min hold     ASSESSMENT:  CLINICAL IMPRESSION: ***  IMPAIRMENTS/ DEFICITS: Objective impairments include Abnormal gait, decreased activity tolerance, decreased balance, decreased endurance, difficulty walking, decreased strength, increased edema, increased muscle spasms, postural dysfunction, and pain.  REHAB POTENTIAL: Good  CLINICAL DECISION MAKING: Stable/uncomplicated  EVALUATION COMPLEXITY: Low   GOALS:  SHORT TERM GOALS:  STG Name Target Date Goal status  1 Pt to be IND with initial HEP to progress PT  Baseline:  07/04/2021  Met  06/19/2021  2 Pt to be able to walk and stand for >/= 15 min for progression of endurance 07/04/2021   Met 06/19/2021  3 Increase L hip flexoin to >/= 90 degrees actively with </= 5/10 pain max for progression of hip mobility  07/04/2021  Met 06/19/2021   LONG TERM GOALS:   LTG Name Target Date Goal status  1 Increase L hip gross strength to >/= 4/5 to promote hip stability and maximize safety  Baseline: 08/03/2021: 4+/5 or better globally 07/18/2021  ACHIEVED 08/03/2021  2 Increase L hip AROM WFL compared bil to assist with general  mobility required for ADLS with </= max 3/29 pain Baseline: 08/03/2021: Pt has achieved WNL Lt hip AROM with no pain 07/18/2021 ACHIEVED 08/03/2021  3 Pt to be able to stand/ walk for >/= 30 min with LRAD demonstrating heel strike/ toe off pattern with min deviation for functional endurance for in home amb and short community distances Baseline: 08/03/2021: Pt reports ability to walk about 1 hour with a [facility] 07/18/2021 ACHIEVED   4 Pt to increase FOTO score to >/= 70% to demo improvement in function Baseline: 08/03/2021: 54% 07/18/2021 Ongoing 08/03/2021  4 Pt to be able to perform 5 x sit to stand </= 15 seconds to demo improvement in function Baseline: at eval 18 seconds, at re-eval unable to perform 08/01/2021: 17 seconds 07/18/2021 Ongoing 08/03/2021  5 Pt to be IND with all HEP to maintain and progress curreht LOF IND 08/03/2021: Pt reports daily adherence to her HEP 07/18/2021 ACHIEVED 08/03/2021   PLAN: PT FREQUENCY: 2x/week  PT DURATION: 6 weeks  PLANNED INTERVENTIONS: Therapeutic exercises, Therapeutic activity, Neuro Muscular re-education, Balance training, Gait training, Patient/Family education, Joint mobilization, Stair training, DME instructions, Dry Needling, Electrical stimulation, Cryotherapy, Moist heat, Taping, Ionotophoresis 49m/ml Dexamethasone, and Manual therapy  PLAN FOR NEXT SESSION: Review/ update HEP PRN.  ERO next session Posterior hip precautions, Hip  ROM, gross strengthening,  Stair negotiation.  Continue with gentle hip stretches and strengthening per patient's tolerance.  Progressing to [facility] as able/balance. Response to iontophoresis and desensitization techniques. Response to DN.   Vanessa Opp, PT, DPT 09/08/21 9:06 AM

## 2021-09-08 NOTE — Telephone Encounter (Signed)
Left message for pt regarding her 1st no show. Discussed the clinic attendance policy and provided the clinic phone number.

## 2021-09-14 ENCOUNTER — Encounter: Payer: Medicare Other | Admitting: Physical Therapy

## 2021-09-15 ENCOUNTER — Ambulatory Visit: Payer: Medicare Other

## 2021-09-15 DIAGNOSIS — M6281 Muscle weakness (generalized): Secondary | ICD-10-CM

## 2021-09-15 DIAGNOSIS — R2689 Other abnormalities of gait and mobility: Secondary | ICD-10-CM

## 2021-09-15 DIAGNOSIS — M25552 Pain in left hip: Secondary | ICD-10-CM | POA: Diagnosis present

## 2021-09-15 NOTE — Therapy (Signed)
OUTPATIENT PHYSICAL THERAPY TREATMENT NOTE/ RE-CERTIFICATION       Patient Name: Christine Roberson MRN: [id number] DOB:01/22/1966, 56 y.o., female Today's Date: 09/15/2021  PCP: Daleen Snook, NP REFERRING PROVIDER: Daleen Snook, NP   PT End of Session - 09/15/21 1352     Visit Number 24    Number of Visits 30    Date for PT Re-Evaluation 11/03/21    Authorization Type [employer] MCR, KX Modifier after v15    Progress Note Due on Visit 30    PT Start Time 1345    PT Stop Time 1425    PT Time Calculation (min) 40 min    Equipment Utilized During Treatment Gait belt    Activity Tolerance Patient tolerated treatment well    Behavior During Therapy WFL for tasks assessed/performed                                  Past Medical History:  Diagnosis Date   Arthritis    Chronic back pain    DDD (degenerative disc disease), lumbar    Headache    Hypertension    MVP (mitral valve prolapse)    Past Surgical History:  Procedure Laterality Date   ABDOMINAL HYSTERECTOMY     CLOSED MANIPULATION SHOULDER WITH STERIOD INJECTION Right 03/05/2017   Procedure: CLOSED MANIPULATION SHOULDER WITH STEROID INJECTION;  Surgeon: Renette Butters, MD;  Location: [place];  Service: Orthopedics;  Laterality: Right;   JOINT REPLACEMENT Right    TKR   KNEE ARTHROSCOPY Bilateral    SPINAL CORD STIMULATOR IMPLANT     SPINAL CORD STIMULATOR REMOVAL     TONSILLECTOMY     TOTAL HIP ARTHROPLASTY Right    There are no problems to display for this patient.   REFERRING DIAG: Unilateral primary osteoarthritis, left hip [M16.12]  THERAPY DIAG:  Pain in left hip - Plan: PT plan of care cert/re-cert  Muscle weakness (generalized) - Plan: PT plan of care cert/re-cert  Other abnormalities of gait and mobility - Plan: PT plan of care cert/re-cert  PERTINENT HISTORY: R THA , L THA 2/14  PRECAUTIONS: Posterior hip precautions  SUBJECTIVE: Pt reports she would like  to continue therapy, adding that her orthopedic surgeon told her yesterday that he would recommend her to continue PT. 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  PAIN:  Are you having pain? Yes: NPRS scale: 5/10 Pain location: Lt lateral hip Pain description: achy Aggravating factors: standing/ walking Relieving factors: sitting down.      OBJECTIVE:  *Unless otherwise noted by date, all objective measures were captured on initial evaluation.   DIAGNOSTIC FINDINGS:  MRI 01/18/2020 IMPRESSION: No acute abnormality. Degenerative tear of the anterior, superior right labrum.  PATIENT SURVEYS:  FOTO 50%, predicted 70%   06/06/2021 41%   06/19/2021  55% limited  COGNITION:  Overall cognitive status: Within functional limits for tasks assessed     SENSATION:  Light touch: Appears intact  POSTURE:  Forward head position  PALPATION: TTP along the glute med/ and along the anterior aspect of the acetabulofemoral joint, along the proximal adductors, piriformis and proximal rectus femoris.   LE AROM/PROM:  A/PROM Right 04/18/2021 Left 04/18/2021 Left  05/18/2021 Left 08/03/2021  Hip flexion [facility] WFL * *60/110 120/ 130p!  Hip extension [facility] *  Hip abduction [facility] WFL * *32 in supine 45/ 50p!  Hip adduction WFL WFL *  *WFL   Hip internal rotation      Hip external rotation      Knee flexion      Knee extension      Ankle dorsiflexion      Ankle plantarflexion      Ankle inversion      Ankle eversion         (Blank rows = not tested, * pain during assessment)  LE MMT:  MMT Right 04/18/2021 Left 04/18/2021 Left  05/18/2021 Left 06/19/2021 Right 08/03/2021 Left 08/03/2021  Hip flexion 4/5 4/-5* 3+/5 4/5 4+/5 4+/5  Hip extension 4-/5 3/5* 3+/5 4-/5 4+/5 4+/5  Hip abduction 4-/5 3+/5* 3+/5 4-/5 4+/5 5/5  Hip adduction 4-/5 4-/5* 4/5 4/5 5/5 5/5  Hip internal rotation        Hip external  rotation        Knee flexion        Knee extension        Ankle dorsiflexion        Ankle plantarflexion        Ankle inversion        Ankle eversion         (Blank rows = not tested, *pain during testing )  LOWER EXTREMITY SPECIAL TESTS:  N/A  FUNCTIONAL TESTS:  5 times sit to stand: 18 seconds 05/23/2021 - unable to perform  08/01/2021: 5xSTS: 17 seconds  09/17/2021: 5xSTS: 12 seconds  GAIT: Assistive device utilized: RW Level of assistance: Complete [place] Comments: antalgic gait with decreaesd stance on the LLE, and stride on the RLE   TODAY'S TREATMENT:  [facility] Adult PT Treatment:                                                DATE: 09/15/2021 Therapeutic Exercise: NuStep x5 minutes at level 5 while collecting subjective information Sidelying Lt IT band stretch with leg off table x64mn Bridge isometric with marching with YTB around thighs 3x20 Sidelying hip abduction with YTB 3x10 BIL Sidelying hip adduction with YTB resistance 2x10 BIL Thomas stretch x240m BIL Manual Therapy: N/A Neuromuscular re-ed: N/A Therapeutic Activity: Re-administration of FOTO with pt education Re-assessment of objective measures Sit-to-stand 3x5 Modalities: N/A Self Care: N/A   [facility] Adult PT Treatment:                                                DATE: 08/22/2021 Therapeutic Exercise: ITB stretch 2 x 30 sec on R sidelying L hip abduction in R Sidelying 2 x 15 Nu-step L5 x 5 min L hip SL bridge going to fatigue - progressing to double leg bridge going to fatigue with RTB around knees for added glute med activation going to fatigue.  Manual Therapy: IASTM along the glute med/ piriformis LLE LAD grade III  Trigger Point Dry-Needling  Treatment instructions: Expect mild to moderate muscle soreness. S/S of pneumothorax if dry needled over a lung field, and to seek immediate medical attention should they occur. Patient verbalized understanding of these instructions and  education.  Patient Consent Given: Yes Education handout provided: Previously provided Muscles treated: L glute med/ piriformis Electrical  stimulation performed: No Parameters: N/A Treatment response/outcome: twitch response and muscle lengthening.     [facility] Adult PT Treatment:                                                DATE: 08/17/2021 Therapeutic Exercise: Nu-step L10 x 6 min while collecting subjective information Mini-squat side stepping on treadmill at 0.2 mph x3 min BIL with UE support Bridge Isometric with alternating marching and Rtb resistance around thighs 3x20 Standing hip extension with 7# cable to ankle attachment on FreeMotion machine 2x10 BIL Standing hip abduction with 7# cable to ankle attachment on FreeMotion machine 2x10 BIL Squat to table with subsequent side-step x3 down and back length of table with RTB around thighs Seated BIL hip IR with RTB 2x10 with 3-sec holds     PATIENT EDUCATION:  Education details: re-evaluation findings, POC, goals. reviewed HEP and updated today  Person educated: Patient Education method: Explanation Education comprehension: verbalized understanding   HOME EXERCISE PROGRAM: Access Code: [email or web address] URL: [email or web address] Date: 06/29/2021 Prepared by: Starr Lake  Exercises - Hooklying Clamshell with Resistance  - 1 x daily - 7 x weekly - 2 sets - 10 reps - Supine Heel Slides (Mirrored)  - 1 x daily - 7 x weekly - 2 sets - 10 reps - Supine March  - 1 x daily - 7 x weekly - 2 sets - 10 reps - 5 hold - Supine Quad Set  - 1 x daily - 7 x weekly - 2 sets - 10 reps - Modified Thomas Stretch (Mirrored)  - 2 x daily - 7 x weekly - 2 sets - 2 reps - 30 seconds hold - Lateral Weight Shift with Parallel Bars (BKA)  - 1 x daily - 7 x weekly - 1 sets - 1 min hold - Standing Hip Abduction with Counter Support  - 1 x daily - 7 x weekly - 1 sets - 10 reps - SLR  - 1 x daily - 7 x weekly - 3 sets - 10 reps - 1  hold - Supine Quadriceps Stretch with Strap on Table  - 1 x daily - 7 x weekly - 3 sets - 30 seconds hold - Seated Hamstring Stretch  - 1 x daily - 7 x weekly - 2 sets - 2 reps - 30 hold  Added 08/01/2021: - Sidelying ITB Stretch off Table  - 1 x daily - 7 x weekly - 2-min hold     ASSESSMENT:  CLINICAL IMPRESSION: Pt responded well to all exercises today, demonstrating good form and no increase of baseline pain. Upon re-assessment, the pt has made excellent progress in her 5xSTS score, although she remains limited by baseline pain. She will continue to benefit from skilled PT to address her primary impairments and return to her prior level of function with less limitation.  IMPAIRMENTS/ DEFICITS: Objective impairments include Abnormal gait, decreased activity tolerance, decreased balance, decreased endurance, difficulty walking, decreased strength, increased edema, increased muscle spasms, postural dysfunction, and pain.  REHAB POTENTIAL: Good  CLINICAL DECISION MAKING: Stable/uncomplicated  EVALUATION COMPLEXITY: Low   GOALS:  SHORT TERM GOALS:  STG Name Target Date Goal status  1 Pt to be IND with initial HEP to progress PT  Baseline:  07/04/2021  Met  06/19/2021  2 Pt to be able to walk and stand for >/= 15  min for progression of endurance 07/04/2021   Met 06/19/2021  3 Increase L hip flexoin to >/= 90 degrees actively with </= 5/10 pain max for progression of hip mobility  07/04/2021  Met 06/19/2021   LONG TERM GOALS:   LTG Name Target Date Goal status  1 Increase L hip gross strength to >/= 4/5 to promote hip stability and maximize safety  Baseline: 08/03/2021: 4+/5 or better globally 07/18/2021  ACHIEVED 08/03/2021  2 Increase L hip AROM WFL compared bil to assist with general mobility required for ADLS with </= max 5/39 pain Baseline: 08/03/2021: Pt has achieved WNL Lt hip AROM with no pain 07/18/2021 ACHIEVED 08/03/2021  3 Pt to be able to stand/ walk for >/= 30 min with LRAD  demonstrating heel strike/ toe off pattern with min deviation for functional endurance for in home amb and short community distances Baseline: 08/03/2021: Pt reports ability to walk about 1 hour with a [facility] 07/18/2021 ACHIEVED   4 Pt to increase FOTO score to >/= 70% to demo improvement in function Baseline: 08/03/2021: 54% 09/15/2021: 63% 07/18/2021 ONGOING 09/15/2021  4 Pt to be able to perform 5 x sit to stand </= 15 seconds to demo improvement in function Baseline: at eval 18 seconds, at re-eval unable to perform 08/01/2021: 17 seconds 09/15/2021: 12 seconds 07/18/2021 ACHIEVED 09/15/2021  5 Pt to be IND with all HEP to maintain and progress curreht LOF IND 08/03/2021: Pt reports daily adherence to her HEP 07/18/2021 ACHIEVED 08/03/2021  6 *New 09/15/2021* Pt will reports average resting hip pain of 0-2/10 in order to perform standing ADLs with less limitation. Baseline (09/15/2021): 5/10 pain 11/03/2021 INITIAL 09/15/2021   PLAN: PT FREQUENCY: 1x/week  PT DURATION: 6 weeks  PLANNED INTERVENTIONS: Therapeutic exercises, Therapeutic activity, Neuro Muscular re-education, Balance training, Gait training, Patient/Family education, Joint mobilization, Stair training, DME instructions, Dry Needling, Electrical stimulation, Cryotherapy, Moist heat, Taping, Ionotophoresis 40m/ml Dexamethasone, and Manual therapy  PLAN FOR NEXT SESSION: Review/ update HEP PRN.  ERO next session Posterior hip precautions, Hip ROM, gross strengthening,  Stair negotiation.  Continue with gentle hip stretches and strengthening per patient's tolerance.  [facility]. Response to iontophoresis and desensitization techniques. Response to DN.   YVanessa [place] PT, DPT 09/15/21 2:25 PM

## 2021-09-18 ENCOUNTER — Encounter: Payer: Self-pay | Admitting: Physical Therapy

## 2021-09-18 ENCOUNTER — Ambulatory Visit: Payer: Medicare Other | Admitting: Physical Therapy

## 2021-09-18 DIAGNOSIS — M25552 Pain in left hip: Secondary | ICD-10-CM

## 2021-09-18 DIAGNOSIS — R2689 Other abnormalities of gait and mobility: Secondary | ICD-10-CM

## 2021-09-18 DIAGNOSIS — M6281 Muscle weakness (generalized): Secondary | ICD-10-CM

## 2021-09-18 NOTE — Therapy (Signed)
OUTPATIENT PHYSICAL THERAPY TREATMENT NOTE/ RE-CERTIFICATION       Patient Name: Christine Roberson MRN: [id number] DOB:Aug 23, 1965, 56 y.o., female Today's Date: 09/18/2021  PCP: Daleen Snook, NP REFERRING PROVIDER: Daleen Snook, NP   PT End of Session - 09/18/21 1340     Visit Number 25    Number of Visits 30    Date for PT Re-Evaluation 11/03/21    Authorization Type UHC MCR, KX Modifier after v15    Progress Note Due on Visit 30    PT Start Time 1340    PT Stop Time 1420    PT Time Calculation (min) 40 min    Activity Tolerance Patient tolerated treatment well                                   Past Medical History:  Diagnosis Date   Arthritis    Chronic back pain    DDD (degenerative disc disease), lumbar    Headache    Hypertension    MVP (mitral valve prolapse)    Past Surgical History:  Procedure Laterality Date   ABDOMINAL HYSTERECTOMY     CLOSED MANIPULATION SHOULDER WITH STERIOD INJECTION Right 03/05/2017   Procedure: CLOSED MANIPULATION SHOULDER WITH STEROID INJECTION;  Surgeon: Renette Butters, MD;  Location: [place];  Service: Orthopedics;  Laterality: Right;   JOINT REPLACEMENT Right    TKR   KNEE ARTHROSCOPY Bilateral    SPINAL CORD STIMULATOR IMPLANT     SPINAL CORD STIMULATOR REMOVAL     TONSILLECTOMY     TOTAL HIP ARTHROPLASTY Right    There are no problems to display for this patient.   REFERRING DIAG: Unilateral primary osteoarthritis, left hip [M16.12]  THERAPY DIAG:  Pain in left hip  Muscle weakness (generalized)  Other abnormalities of gait and mobility  PERTINENT HISTORY: R THA , L THA 2/14  PRECAUTIONS: Posterior hip precautions  SUBJECTIVE: "I am feeling at about a 5/10 in the hip. I did see the MD and he recommended using voltaren a few times a day on the hip." PAIN:  Are you having pain? Yes: NPRS scale: 5/10 Pain location: Lt lateral hip Pain description: achy Aggravating  factors: standing/ walking Relieving factors: sitting down.      OBJECTIVE:  *Unless otherwise noted by date, all objective measures were captured on initial evaluation.   DIAGNOSTIC FINDINGS:  MRI 01/18/2020 IMPRESSION: No acute abnormality. Degenerative tear of the anterior, superior right labrum.  PATIENT SURVEYS:  FOTO 50%, predicted 70%   06/06/2021 41%   06/19/2021  55% limited  COGNITION:  Overall cognitive status: Within functional limits for tasks assessed     SENSATION:  Light touch: Appears intact  POSTURE:  Forward head position  PALPATION: TTP along the glute med/ and along the anterior aspect of the acetabulofemoral joint, along the proximal adductors, piriformis and proximal rectus femoris.   LE AROM/PROM:  A/PROM Right 04/18/2021 Left 04/18/2021 Left  05/18/2021 Left 08/03/2021  Hip flexion [facility] WFL * *60/110 120/ 130p!  Hip extension [facility] WFL *    Hip abduction [facility] WFL * *32 in supine 45/ 50p!  Hip adduction WFL WFL *  *WFL   Hip internal rotation      Hip external rotation      Knee flexion      Knee extension      Ankle dorsiflexion      Ankle plantarflexion  Ankle inversion      Ankle eversion         (Blank rows = not tested, * pain during assessment)  LE MMT:  MMT Right 04/18/2021 Left 04/18/2021 Left  05/18/2021 Left 06/19/2021 Right 08/03/2021 Left 08/03/2021  Hip flexion 4/5 4/-5* 3+/5 4/5 4+/5 4+/5  Hip extension 4-/5 3/5* 3+/5 4-/5 4+/5 4+/5  Hip abduction 4-/5 3+/5* 3+/5 4-/5 4+/5 5/5  Hip adduction 4-/5 4-/5* 4/5 4/5 5/5 5/5  Hip internal rotation        Hip external rotation        Knee flexion        Knee extension        Ankle dorsiflexion        Ankle plantarflexion        Ankle inversion        Ankle eversion         (Blank rows = not tested, *pain during testing )  LOWER EXTREMITY SPECIAL TESTS:  N/A  FUNCTIONAL TESTS:  5 times sit to stand: 18 seconds 05/23/2021 - unable to perform  08/01/2021: 5xSTS: 17  seconds  09/17/2021: 5xSTS: 12 seconds  GAIT: Assistive device utilized: RW Level of assistance: Complete Independence Comments: antalgic gait with decreaesd stance on the LLE, and stride on the RLE   TODAY'S TREATMENT:  [facility] Adult PT Treatment:                                                DATE: 09/18/2021 Therapeutic Exercise: Nustep L7 x 5 min LE only Leg press 3 x 12 with 40 lbs. Con bil/ ecc RLE only Step up 6in step 2 x 12 leading with LLE Standing hip flexor stretch 2 x 30 in split stance position 3 way hip strengthening in // 2 x 12 ea bil with RTB flexion/ abduction/ extension Standing marching in // 2 x 15 without UE support  alternating L/R    [facility] Adult PT Treatment:                                                DATE: 09/15/2021 Therapeutic Exercise: NuStep x5 minutes at level 5 while collecting subjective information Sidelying Lt IT band stretch with leg off table x40mn Bridge isometric with marching with YTB around thighs 3x20 Sidelying hip abduction with YTB 3x10 BIL Sidelying hip adduction with YTB resistance 2x10 BIL Thomas stretch x239m BIL Manual Therapy: N/A Neuromuscular re-ed: N/A Therapeutic Activity: Re-administration of FOTO with pt education Re-assessment of objective measures Sit-to-stand 3x5 Modalities: N/A Self Care: N/A   [facility] Adult PT Treatment:                                                DATE: 08/22/2021 Therapeutic Exercise: ITB stretch 2 x 30 sec on R sidelying L hip abduction in R Sidelying 2 x 15 Nu-step L5 x 5 min L hip SL bridge going to fatigue - progressing to double leg bridge going to fatigue with RTB around knees for added glute med activation going to fatigue.  Manual Therapy: IASTM along the glute  med/ piriformis LLE LAD grade III  Trigger Point Dry-Needling  Treatment instructions: Expect mild to moderate muscle soreness. S/S of pneumothorax if dry needled over a lung field, and to seek immediate medical attention  should they occur. Patient verbalized understanding of these instructions and education.  Patient Consent Given: Yes Education handout provided: Previously provided Muscles treated: L glute med/ piriformis Electrical stimulation performed: No Parameters: N/A Treatment response/outcome: twitch response and muscle lengthening.        PATIENT EDUCATION:  Education details: re-evaluation findings, POC, goals. reviewed HEP and updated today  Person educated: Patient Education method: Explanation Education comprehension: verbalized understanding   HOME EXERCISE PROGRAM: Access Code: [email or web address] URL: [email or web address] Date: 06/29/2021 Prepared by: Starr Lake  Exercises - Hooklying Clamshell with Resistance  - 1 x daily - 7 x weekly - 2 sets - 10 reps - Supine Heel Slides (Mirrored)  - 1 x daily - 7 x weekly - 2 sets - 10 reps - Supine March  - 1 x daily - 7 x weekly - 2 sets - 10 reps - 5 hold - Supine Quad Set  - 1 x daily - 7 x weekly - 2 sets - 10 reps - Modified Thomas Stretch (Mirrored)  - 2 x daily - 7 x weekly - 2 sets - 2 reps - 30 seconds hold - Lateral Weight Shift with Parallel Bars (BKA)  - 1 x daily - 7 x weekly - 1 sets - 1 min hold - Standing Hip Abduction with Counter Support  - 1 x daily - 7 x weekly - 1 sets - 10 reps - SLR  - 1 x daily - 7 x weekly - 3 sets - 10 reps - 1 hold - Supine Quadriceps Stretch with Strap on Table  - 1 x daily - 7 x weekly - 3 sets - 30 seconds hold - Seated Hamstring Stretch  - 1 x daily - 7 x weekly - 2 sets - 2 reps - 30 hold  Added 08/01/2021: - Sidelying ITB Stretch off Table  - 1 x daily - 7 x weekly - 2-min hold     ASSESSMENT:  CLINICAL IMPRESSION: Pt arrives noting continued 5/10 pain in the hip but did reference just how much progress she has made since starting PT. Continued focus on gross hip strengthening in standing with added resistance with standing rest breaks to continue to promote strength and  endurance. Overall she is doing well but does continue to be limited mostly due to pain in the lateral hip which she noted today stayed at 5/10.   IMPAIRMENTS/ DEFICITS: Objective impairments include Abnormal gait, decreased activity tolerance, decreased balance, decreased endurance, difficulty walking, decreased strength, increased edema, increased muscle spasms, postural dysfunction, and pain.  REHAB POTENTIAL: Good  CLINICAL DECISION MAKING: Stable/uncomplicated  EVALUATION COMPLEXITY: Low   GOALS:  SHORT TERM GOALS:  STG Name Target Date Goal status  1 Pt to be IND with initial HEP to progress PT  Baseline:  07/04/2021  Met  06/19/2021  2 Pt to be able to walk and stand for >/= 15 min for progression of endurance 07/04/2021   Met 06/19/2021  3 Increase L hip flexoin to >/= 90 degrees actively with </= 5/10 pain max for progression of hip mobility  07/04/2021  Met 06/19/2021   LONG TERM GOALS:   LTG Name Target Date Goal status  1 Increase L hip gross strength to >/= 4/5 to promote hip stability and maximize safety  Baseline: 08/03/2021:  4+/5 or better globally 07/18/2021  MET 08/03/2021  2 Increase L hip AROM WFL compared bil to assist with general mobility required for ADLS with </= max 9/74 pain Baseline: 08/03/2021: Pt has achieved WNL Lt hip AROM with no pain 07/18/2021 MET 08/03/2021  3 Pt to be able to stand/ walk for >/= 30 min with LRAD demonstrating heel strike/ toe off pattern with min deviation for functional endurance for in home amb and short community distances Baseline: 08/03/2021: Pt reports ability to walk about 1 hour with a [facility] 07/18/2021 ACHIEVED   4 Pt to increase FOTO score to >/= 70% to demo improvement in function Baseline: 08/03/2021: 54% 09/15/2021: 63% 07/18/2021 ONGOING 09/15/2021  4 Pt to be able to perform 5 x sit to stand </= 15 seconds to demo improvement in function Baseline: at eval 18 seconds, at re-eval unable to perform 08/01/2021: 17 seconds 09/15/2021:  12 seconds 07/18/2021 MET 09/15/2021  5 Pt to be IND with all HEP to maintain and progress curreht LOF IND 08/03/2021: Pt reports daily adherence to her HEP 07/18/2021 MET 08/03/2021  6 *New 09/15/2021* Pt will reports average resting hip pain of 0-2/10 in order to perform standing ADLs with less limitation. Baseline (09/15/2021): 5/10 pain 11/03/2021 INITIAL 09/15/2021   PLAN: PT FREQUENCY: 1x/week  PT DURATION: 6 weeks  PLANNED INTERVENTIONS: Therapeutic exercises, Therapeutic activity, Neuro Muscular re-education, Balance training, Gait training, Patient/Family education, Joint mobilization, Stair training, DME instructions, Dry Needling, Electrical stimulation, Cryotherapy, Moist heat, Taping, Ionotophoresis 10m/ml Dexamethasone, and Manual therapy  PLAN FOR NEXT SESSION: Review/ update HEP PRN.  ERO next session Posterior hip precautions, Hip ROM, gross strengthening,  Stair negotiation.  Continue with gentle hip stretches and strengthening per patient's tolerance.  [facility]. Response to iontophoresis and desensitization techniques. Response to DN.   Furkan Keenum PT, DPT, LAT, ATC  09/18/21  2:22 PM

## 2021-09-26 ENCOUNTER — Encounter: Payer: Self-pay | Admitting: Physical Therapy

## 2021-09-26 ENCOUNTER — Ambulatory Visit: Payer: Medicare Other | Admitting: Physical Therapy

## 2021-09-26 DIAGNOSIS — M25552 Pain in left hip: Secondary | ICD-10-CM | POA: Diagnosis not present

## 2021-09-26 DIAGNOSIS — R2689 Other abnormalities of gait and mobility: Secondary | ICD-10-CM

## 2021-09-26 DIAGNOSIS — M6281 Muscle weakness (generalized): Secondary | ICD-10-CM

## 2021-10-04 ENCOUNTER — Ambulatory Visit: Payer: Medicare Other | Attending: Adult Reconstructive Orthopaedic Surgery

## 2021-10-04 DIAGNOSIS — R2689 Other abnormalities of gait and mobility: Secondary | ICD-10-CM | POA: Insufficient documentation

## 2021-10-04 DIAGNOSIS — M6281 Muscle weakness (generalized): Secondary | ICD-10-CM | POA: Insufficient documentation

## 2021-10-04 DIAGNOSIS — M25552 Pain in left hip: Secondary | ICD-10-CM | POA: Insufficient documentation

## 2021-10-04 NOTE — Therapy (Signed)
OUTPATIENT PHYSICAL THERAPY TREATMENT NOTE/ RE-CERTIFICATION       Patient Name: Christine Roberson MRN: [id number] DOB:14-Aug-1965, 56 y.o., female Today's Date: 10/04/2021  PCP: Daleen Snook, NP REFERRING PROVIDER: Daleen Snook, NP   PT End of Session - 10/04/21 1131     Visit Number 27    Number of Visits 30    Date for PT Re-Evaluation 11/03/21    Authorization Type UHC MCR, KX Modifier after v15    Progress Note Due on Visit 30    PT Start Time 1131    PT Stop Time 1221   10 minutes moist heat pack   PT Time Calculation (min) 50 min    Equipment Utilized During Treatment Gait belt    Activity Tolerance Patient tolerated treatment well    Behavior During Therapy WFL for tasks assessed/performed                                     Past Medical History:  Diagnosis Date   Arthritis    Chronic back pain    DDD (degenerative disc disease), lumbar    Headache    Hypertension    MVP (mitral valve prolapse)    Past Surgical History:  Procedure Laterality Date   ABDOMINAL HYSTERECTOMY     CLOSED MANIPULATION SHOULDER WITH STERIOD INJECTION Right 03/05/2017   Procedure: CLOSED MANIPULATION SHOULDER WITH STEROID INJECTION;  Surgeon: Renette Butters, MD;  Location: [place];  Service: Orthopedics;  Laterality: Right;   JOINT REPLACEMENT Right    TKR   KNEE ARTHROSCOPY Bilateral    SPINAL CORD STIMULATOR IMPLANT     SPINAL CORD STIMULATOR REMOVAL     TONSILLECTOMY     TOTAL HIP ARTHROPLASTY Right    There are no problems to display for this patient.   REFERRING DIAG: Unilateral primary osteoarthritis, left hip [M16.12]  THERAPY DIAG:  Pain in left hip  Muscle weakness (generalized)  Other abnormalities of gait and mobility  PERTINENT HISTORY: R THA , L THA 2/14  PRECAUTIONS: Posterior hip precautions  SUBJECTIVE: Pt reports feeling well today, rating her hip pain as 4/10. She reports she hopes to get additional  imaging for her hip.  PAIN:  Are you having pain? Yes: NPRS scale: 4/10 Pain location: Lt lateral hip Pain description: achy Aggravating factors: standing/ walking Relieving factors: sitting down.      OBJECTIVE:  *Unless otherwise noted by date, all objective measures were captured on initial evaluation.   DIAGNOSTIC FINDINGS:  MRI 01/18/2020 IMPRESSION: No acute abnormality. Degenerative tear of the anterior, superior right labrum.  PATIENT SURVEYS:  FOTO 50%, predicted 70%   06/06/2021 41%   06/19/2021  55% limited  COGNITION:  Overall cognitive status: Within functional limits for tasks assessed     SENSATION:  Light touch: Appears intact  POSTURE:  Forward head position  PALPATION: TTP along the glute med/ and along the anterior aspect of the acetabulofemoral joint, along the proximal adductors, piriformis and proximal rectus femoris.   LE AROM/PROM:  A/PROM Right 04/18/2021 Left 04/18/2021 Left  05/18/2021 Left 08/03/2021  Hip flexion [facility] WFL * *60/110 120/ 130p!  Hip extension [facility] WFL *    Hip abduction [facility] WFL * *32 in supine 45/ 50p!  Hip adduction WFL WFL *  *WFL   Hip internal rotation      Hip external rotation      Knee flexion  Knee extension      Ankle dorsiflexion      Ankle plantarflexion      Ankle inversion      Ankle eversion         (Blank rows = not tested, * pain during assessment)  LE MMT:  MMT Right 04/18/2021 Left 04/18/2021 Left  05/18/2021 Left 06/19/2021 Right 08/03/2021 Left 08/03/2021  Hip flexion 4/5 4/-5* 3+/5 4/5 4+/5 4+/5  Hip extension 4-/5 3/5* 3+/5 4-/5 4+/5 4+/5  Hip abduction 4-/5 3+/5* 3+/5 4-/5 4+/5 5/5  Hip adduction 4-/5 4-/5* 4/5 4/5 5/5 5/5  Hip internal rotation        Hip external rotation        Knee flexion        Knee extension        Ankle dorsiflexion        Ankle plantarflexion        Ankle inversion        Ankle eversion         (Blank rows = not tested, *pain during testing )  LOWER EXTREMITY  SPECIAL TESTS:  N/A  FUNCTIONAL TESTS:  5 times sit to stand: 18 seconds 05/23/2021 - unable to perform  08/01/2021: 5xSTS: 17 seconds  09/17/2021: 5xSTS: 12 seconds  GAIT: Assistive device utilized: RW Level of assistance: Complete Independence Comments: antalgic gait with decreaesd stance on the LLE, and stride on the RLE   TODAY'S TREATMENT:  [facility] Adult PT Treatment:                                                DATE: 10/04/2021 Therapeutic Exercise: Nu-step l6 x 5 min LE only while collecting subjective information Standing hamstring curl with 7# cable 2x10 Standing hip extension with 7# cable 2x10 Standing hip abduction with 3# cable 2x10 Standing hip flexion with 7# cable 2x10 Seated hip IR with 7# cable 2x10 BIL Seated hip ER with 3# cable 2x10 BIL Prone hip flexor stretch with contralateral leg off table x75mn on Lt DKTC stretch 282m Manual Therapy: N/A Neuromuscular re-ed: N/A Therapeutic Activity: N/A Modalities: Moist heat pack to Lt hip in sidelying x10 minutes with no adverse response Self Care: N/A   [facility]                                                DATE: 09/[facility]/20[facility] Therapeutic Exercise: Nu-step l6 x 5 min LE only Step up with contralateral hip flexion 2 x 10 bil using 6 inch step Seated L hip ER 2 x 12 with YTB with with ball between the knees Manual Therapy: MTPR along the piriformis at its distal attachment Neuromuscular re-ed: Gait training heel strike/ toe off with trekking poles 350 ft. Cues proper form  Modalities: Iontophoresis patch 18m1ml on the L greater trochanter    [facility] Adult PT Treatment:                                                DATE: 09/18/2021 Therapeutic Exercise: Nustep L7 x 5 min LE only Leg press 3 x  12 with 40 lbs. Con bil/ ecc RLE only Step up 6in step 2 x 12 leading with LLE Standing hip flexor stretch 2 x 30 in split stance position 3 way hip strengthening in // 2 x 12 ea bil with RTB flexion/  abduction/ extension Standing marching in // 2 x 15 without UE support  alternating L/R     PATIENT EDUCATION:  Education details: re-evaluation findings, POC, goals. reviewed HEP and updated today  Person educated: Patient Education method: Explanation Education comprehension: verbalized understanding   HOME EXERCISE PROGRAM: Access Code: [email or web address] URL: [email or web address] Date: 06/29/2021 Prepared by: Starr Lake  Exercises - Hooklying Clamshell with Resistance  - 1 x daily - 7 x weekly - 2 sets - 10 reps - Supine Heel Slides (Mirrored)  - 1 x daily - 7 x weekly - 2 sets - 10 reps - Supine March  - 1 x daily - 7 x weekly - 2 sets - 10 reps - 5 hold - Supine Quad Set  - 1 x daily - 7 x weekly - 2 sets - 10 reps - Modified Thomas Stretch (Mirrored)  - 2 x daily - 7 x weekly - 2 sets - 2 reps - 30 seconds hold - Lateral Weight Shift with Parallel Bars (BKA)  - 1 x daily - 7 x weekly - 1 sets - 1 min hold - Standing Hip Abduction with Counter Support  - 1 x daily - 7 x weekly - 1 sets - 10 reps - SLR  - 1 x daily - 7 x weekly - 3 sets - 10 reps - 1 hold - Supine Quadriceps Stretch with Strap on Table  - 1 x daily - 7 x weekly - 3 sets - 30 seconds hold - Seated Hamstring Stretch  - 1 x daily - 7 x weekly - 2 sets - 2 reps - 30 hold  Added 08/01/2021: - Sidelying ITB Stretch off Table  - 1 x daily - 7 x weekly - 2-min hold     ASSESSMENT:  CLINICAL IMPRESSION: Pt responded well to all interventions today, demonstrating good form and no increase in pain with selected exercises. Pt continues to exhibit functional weakness with loaded hip exercises, but does report she can feel the exercises working. She will continue to benefit from skilled PT to address her primary impairments and return to her prior level of function with less limitation.  IMPAIRMENTS/ DEFICITS: Objective impairments include Abnormal gait, decreased activity tolerance, decreased balance,  decreased endurance, difficulty walking, decreased strength, increased edema, increased muscle spasms, postural dysfunction, and pain.  REHAB POTENTIAL: Good  CLINICAL DECISION MAKING: Stable/uncomplicated  EVALUATION COMPLEXITY: Low   GOALS:  SHORT TERM GOALS:  STG Name Target Date Goal status  1 Pt to be IND with initial HEP to progress PT  Baseline:  07/04/2021  Met  06/19/2021  2 Pt to be able to walk and stand for >/= 15 min for progression of endurance 07/04/2021   Met 06/19/2021  3 Increase L hip flexoin to >/= 90 degrees actively with </= 5/10 pain max for progression of hip mobility  07/04/2021  Met 06/19/2021   LONG TERM GOALS:   LTG Name Target Date Goal status  1 Increase L hip gross strength to >/= 4/5 to promote hip stability and maximize safety  Baseline: 08/03/2021: 4+/5 or better globally 07/18/2021  MET 08/03/2021  2 Increase L hip AROM WFL compared bil to assist with general mobility required for ADLS with </= max 9/98  pain Baseline: 08/03/2021: Pt has achieved WNL Lt hip AROM with no pain 07/18/2021 MET 08/03/2021  3 Pt to be able to stand/ walk for >/= 30 min with LRAD demonstrating heel strike/ toe off pattern with min deviation for functional endurance for in home amb and short community distances Baseline: 08/03/2021: Pt reports ability to walk about 1 hour with a [facility] 07/18/2021 ACHIEVED   4 Pt to increase FOTO score to >/= 70% to demo improvement in function Baseline: 08/03/2021: 54% 09/15/2021: 63% 07/18/2021 ONGOING 09/15/2021  4 Pt to be able to perform 5 x sit to stand </= 15 seconds to demo improvement in function Baseline: at eval 18 seconds, at re-eval unable to perform 08/01/2021: 17 seconds 09/15/2021: 12 seconds 07/18/2021 MET 09/15/2021  5 Pt to be IND with all HEP to maintain and progress curreht LOF IND 08/03/2021: Pt reports daily adherence to her HEP 07/18/2021 MET 08/03/2021  6 *New 09/15/2021* Pt will reports average resting hip pain of 0-2/10 in order to  perform standing ADLs with less limitation. Baseline (09/15/2021): 5/10 pain 11/03/2021 INITIAL 09/15/2021   PLAN: PT FREQUENCY: 1x/week  PT DURATION: 6 weeks  PLANNED INTERVENTIONS: Therapeutic exercises, Therapeutic activity, Neuro Muscular re-education, Balance training, Gait training, Patient/Family education, Joint mobilization, Stair training, DME instructions, Dry Needling, Electrical stimulation, Cryotherapy, Moist heat, Taping, Ionotophoresis 55m/ml Dexamethasone, and Manual therapy  PLAN FOR NEXT SESSION: Review/ update HEP PRN.  ERO next session Posterior hip precautions, Hip ROM, gross strengthening,  Stair negotiation.  Continue with gentle hip stretches and strengthening per patient's tolerance.  [facility]. Response to iontophoresis and desensitization techniques. Response to DN.   YVanessa Elrod PT, DPT 10/04/21 12:26 PM

## 2021-10-10 ENCOUNTER — Encounter: Payer: Self-pay | Admitting: Physical Therapy

## 2021-10-11 ENCOUNTER — Ambulatory Visit: Payer: Medicare Other | Admitting: Physical Therapy

## 2021-10-11 ENCOUNTER — Encounter: Payer: Self-pay | Admitting: Physical Therapy

## 2021-10-11 DIAGNOSIS — M25552 Pain in left hip: Secondary | ICD-10-CM | POA: Diagnosis not present

## 2021-10-11 DIAGNOSIS — M6281 Muscle weakness (generalized): Secondary | ICD-10-CM

## 2021-10-11 DIAGNOSIS — R2689 Other abnormalities of gait and mobility: Secondary | ICD-10-CM

## 2021-10-11 NOTE — Therapy (Signed)
OUTPATIENT PHYSICAL THERAPY TREATMENT NOTE/ DISCHARGE       Patient Name: Christine Roberson MRN: [id number] DOB:1965/07/17, 56 y.o., female Today's Date: 10/11/2021  PCP: Daleen Snook, NP REFERRING PROVIDER: Daleen Snook, NP   PT End of Session - 10/11/21 1418     Visit Number 28    Number of Visits 30    Date for PT Re-Evaluation 11/03/21    Authorization Type [employer] MCR, KX Modifier after v15    Progress Note Due on Visit 62    PT Start Time 9150    PT Stop Time 1500    PT Time Calculation (min) 43 min    Activity Tolerance Patient tolerated treatment well    Behavior During Therapy WFL for tasks assessed/performed                                      Past Medical History:  Diagnosis Date   Arthritis    Chronic back pain    DDD (degenerative disc disease), lumbar    Headache    Hypertension    MVP (mitral valve prolapse)    Past Surgical History:  Procedure Laterality Date   ABDOMINAL HYSTERECTOMY     CLOSED MANIPULATION SHOULDER WITH STERIOD INJECTION Right 03/05/2017   Procedure: CLOSED MANIPULATION SHOULDER WITH STEROID INJECTION;  Surgeon: Renette Butters, MD;  Location: [facility];  Service: Orthopedics;  Laterality: Right;   JOINT REPLACEMENT Right    TKR   KNEE ARTHROSCOPY Bilateral    SPINAL CORD STIMULATOR IMPLANT     SPINAL CORD STIMULATOR REMOVAL     TONSILLECTOMY     TOTAL HIP ARTHROPLASTY Right    There are no problems to display for this patient.   REFERRING DIAG: Unilateral primary osteoarthritis, left hip [M16.12]  THERAPY DIAG:  Pain in left hip  Muscle weakness (generalized)  Other abnormalities of gait and mobility  PERTINENT HISTORY: R THA , L THA 2/14  PRECAUTIONS: Posterior hip precautions  SUBJECTIVE: "pt reports she continues to have pain in the hip rated at about a 6/10 today."  PAIN:  Are you having pain? Yes: NPRS scale: 6/10 Pain location: Lt lateral hip Pain description:  achy Aggravating factors: standing/ walking Relieving factors: sitting down.      OBJECTIVE:  *Unless otherwise noted by date, all objective measures were captured on initial evaluation.   DIAGNOSTIC FINDINGS:  MRI 01/18/2020 IMPRESSION: No acute abnormality. Degenerative tear of the anterior, superior right labrum.  PATIENT SURVEYS:  FOTO 50%, predicted 70%   06/06/2021 41%   06/19/2021  55% limited 09/15/2021 63%  COGNITION:  Overall cognitive status: Within functional limits for tasks assessed     SENSATION:  Light touch: Appears intact  POSTURE:  Forward head position  PALPATION: TTP along the glute med/ and along the anterior aspect of the acetabulofemoral joint, along the proximal adductors, piriformis and proximal rectus femoris.   LE AROM/PROM:  A/PROM Right 04/18/2021 Left 04/18/2021 Left  05/18/2021 Left 08/03/2021  Hip flexion [facility] WFL * *60/110 120/ 130p!  Hip extension [facility] WFL *    Hip abduction [facility] WFL * *32 in supine 45/ 50p!  Hip adduction WFL WFL *  *WFL   Hip internal rotation      Hip external rotation      Knee flexion      Knee extension      Ankle dorsiflexion  Ankle plantarflexion      Ankle inversion      Ankle eversion         (Blank rows = not tested, * pain during assessment)  LE MMT:  MMT Right 04/18/2021 Left 04/18/2021 Left  05/18/2021 Left 06/19/2021 Right 08/03/2021 Left 08/03/2021 Left 10/11/2021  Hip flexion 4/5 4/-5* 3+/5 4/5 4+/5 4+/5 4/5 P!  Hip extension 4-/5 3/5* 3+/5 4-/5 4+/5 4+/5 4/5 P!  Hip abduction 4-/5 3+/5* 3+/5 4-/5 4+/5 5/5 4/5 P!  Hip adduction 4-/5 4-/5* 4/5 4/5 5/5 5/5 4/5 P!  Hip internal rotation         Hip external rotation         Knee flexion         Knee extension         Ankle dorsiflexion         Ankle plantarflexion         Ankle inversion         Ankle eversion          (Blank rows = not tested, *pain during testing )  LOWER EXTREMITY SPECIAL TESTS:  N/A  FUNCTIONAL TESTS:  5 times sit  to stand: 18 seconds 05/23/2021 - unable to perform  08/01/2021: 5xSTS: 17 seconds  09/17/2021: 5xSTS: 12 seconds  GAIT: Assistive device utilized: RW Level of assistance: Complete Independence Comments: antalgic gait with decreaesd stance on the LLE, and stride on the RLE   TODAY'S TREATMENT:  [facility] Adult PT Treatment:                                                DATE: 10/11/2021 Therapeutic Exercise: Extensively reviewed HEP and how to properly progress strengthening with reps/ sets and resistance.     [place] Adult PT Treatment:                                                DATE: 10/04/2021 Therapeutic Exercise: Nu-step l6 x 5 min LE only while collecting subjective information Standing hamstring curl with 7# cable 2x10 Standing hip extension with 7# cable 2x10 Standing hip abduction with 3# cable 2x10 Standing hip flexion with 7# cable 2x10 Seated hip IR with 7# cable 2x10 BIL Seated hip ER with 3# cable 2x10 BIL Prone hip flexor stretch with contralateral leg off table x27mn on Lt DKTC stretch 257m Manual Therapy: N/A Neuromuscular re-ed: N/A Therapeutic Activity: N/A Modalities: Moist heat pack to Lt hip in sidelying x10 minutes with no adverse response Self Care: N/A   [facility]                                                DATE: 09/26/20[facility] Therapeutic Exercise: Nu-step l6 x 5 min LE only Step up with contralateral hip flexion 2 x 10 bil using 6 inch step Seated L hip ER 2 x 12 with YTB with with ball between the knees Manual Therapy: MTPR along the piriformis at its distal attachment Neuromuscular re-ed: Gait training heel strike/ toe off with trekking poles 350 ft. Cues proper form  Modalities:  Iontophoresis patch 24m/ml on the L greater trochanter    PATIENT EDUCATION:  Education details: re-evaluation findings, POC, goals. reviewed HEP and updated today  Person educated: Patient Education method: Explanation Education comprehension:  verbalized understanding   HOME EXERCISE PROGRAM: Access Code: [email or web address] URL: [email or web address] Date: 10/11/2021 Prepared by: [place] Program Notes With all exercises continue to challenge by adding more reps, or sets. once you are able to do 4 sets of 20 reps then its time to move to the next color band. then drop the reps and sets back to down and gradually work back up.   Exercises - Hooklying Clamshell with Resistance  - 1 x daily - 7 x weekly - 3 sets - 15 reps - Supine Heel Slides (Mirrored)  - 1 x daily - 7 x weekly - 2 sets - 10 reps - Supine March  - 1 x daily - 7 x weekly - 2 sets - 10 reps - 5 hold - Supine Quad Set  - 1 x daily - 7 x weekly - 2 sets - 10 reps - 5 - 10 seconds hold - Modified Thomas Stretch (Mirrored)  - 2 x daily - 7 x weekly - 2 sets - 2 reps - 30 seconds hold - Standing Hip Flexor Stretch  - 2 x daily - 7 x weekly - 2 sets - 2 reps - 30 hold - Lateral Weight Shift with Parallel Bars (BKA)  - 1 x daily - 7 x weekly - 1 sets - 1 min hold - Standing Hip Abduction with Counter Support  - 1 x daily - 7 x weekly - 3 sets - 15 reps - Standing Hip Extension with Resistance at Ankles and Counter Support  - 1 x daily - 7 x weekly - 3 sets - 15 reps - SLR  - 1 x daily - 7 x weekly - 3 sets - 10 reps - 1 hold - Supine Quadriceps Stretch with Strap on Table  - 1 x daily - 7 x weekly - 3 sets - 30 seconds hold - Seated Hamstring Stretch  - 1 x daily - 7 x weekly - 2 sets - 2 reps - 30 hold - Sidelying ITB Stretch off Table  - 1 x daily - 7 x weekly - 2-min hold - Sit to Stand Without Arm Support  - 1 x daily - 7 x weekly - 3 sets - 15 reps     ASSESSMENT:  CLINICAL IMPRESSION: Mrs BCarretohas made good progress with physical therapy increasing hip ROM, and strength. She does continue to report pain located in the L posterolateral apsect of the hip that fluctuates constantly between a 4-6/10. She continues to exhibit weakness in the L hip  compared bil and notable atrophy which limits any support/ cushion while sitting likely causing inflammation as a result of direct pressure. Time was taken to extensively review her HEP and provided therabands for progression of strength. Based on limited functional progress and continued pain pt has plateaued regarding physical therapy and will be formally discharged today.   IMPAIRMENTS/ DEFICITS: Objective impairments include Abnormal gait, decreased activity tolerance, decreased balance, decreased endurance, difficulty walking, decreased strength, increased edema, increased muscle spasms, postural dysfunction, and pain.  REHAB POTENTIAL: Good  CLINICAL DECISION MAKING: Stable/uncomplicated  EVALUATION COMPLEXITY: Low   GOALS:  SHORT TERM GOALS:  STG Name Target Date Goal status  1 Pt to be IND with initial HEP to progress PT  Baseline:  07/04/2021  Met  06/19/2021  2 Pt to be able to walk and stand for >/= 15 min for progression of endurance 07/04/2021   Met 06/19/2021  3 Increase L hip flexoin to >/= 90 degrees actively with </= 5/10 pain max for progression of hip mobility  07/04/2021  Met 06/19/2021   LONG TERM GOALS:   LTG Name Target Date Goal status  1 Increase L hip gross strength to >/= 4/5 to promote hip stability and maximize safety  Baseline: 08/03/2021: 4+/5 or better globally 07/18/2021  MET 08/03/2021  2 Increase L hip AROM WFL compared bil to assist with general mobility required for ADLS with </= max 6/60 pain Baseline: 08/03/2021: Pt has achieved WNL Lt hip AROM with no pain 07/18/2021 MET 08/03/2021  3 Pt to be able to stand/ walk for >/= 30 min with LRAD demonstrating heel strike/ toe off pattern with min deviation for functional endurance for in home amb and short community distances Baseline: 08/03/2021: Pt reports ability to walk about 1 hour with a [facility] 07/18/2021 MET 08/03/2021   4 Pt to increase FOTO score to >/= 70% to demo improvement in  function Baseline: 08/03/2021: 54% 09/15/2021: 63%  07/18/2021 Partially met 10/11/2021  4 Pt to be able to perform 5 x sit to stand </= 15 seconds to demo improvement in function Baseline: at eval 18 seconds, at re-eval unable to perform 08/01/2021: 17 seconds 09/15/2021: 12 seconds 07/18/2021 MET 09/15/2021  5 Pt to be IND with all HEP to maintain and progress curreht LOF IND 08/03/2021: Pt reports daily adherence to her HEP 07/18/2021 MET 08/03/2021  6 *New 09/15/2021* Pt will reports average resting hip pain of 0-2/10 in order to perform standing ADLs with less limitation. Baseline (09/15/2021): 5/10 pain 10/11/2021: Constant pain ranging from 4-6/10 11/03/2021 Not Met 10/11/2021   PLAN: PT FREQUENCY: 1x/week  PT DURATION: 6 weeks  PLANNED INTERVENTIONS: Therapeutic exercises, Therapeutic activity, Neuro Muscular re-education, Balance training, Gait training, Patient/Family education, Joint mobilization, Stair training, DME instructions, Dry Needling, Electrical stimulation, Cryotherapy, Moist heat, Taping, Ionotophoresis 74m/ml Dexamethasone, and Manual therapy  PLAN FOR NEXT SESSION: Review/ update HEP PRN.  ERO next session Posterior hip precautions, Hip ROM, gross strengthening,  Stair negotiation.  Continue with gentle hip stretches and strengthening per patient's tolerance.  Progress[facility] to [facility] able/balance. Response to iontophoresis and desensitization techniques. Response to DN.   Keelyn Fjelstad PT, DPT, LAT, ATC  10/11/21  3:14 PM          PHYSICAL THERAPY DISCHARGE SUMMARY  Visits from Start of Care: 28  Current functional level related to goals / functional outcomes: See goals, 63% limited   Remaining deficits: See assessment   Education / Equipment: HEP, theraband, posture, lifting mechanics, walking regiment.    Patient agrees to discharge. Patient goals were partially met. Patient is being discharged due to lack of progress.   Effie Wahlert PT, DPT, LAT,  ATC  10/11/21  3:16 PM

## 2021-10-18 ENCOUNTER — Encounter: Payer: Medicare Other | Admitting: Physical Therapy

## 2021-10-20 ENCOUNTER — Encounter: Payer: Self-pay | Admitting: Physical Therapy

## 2021-10-25 ENCOUNTER — Encounter: Payer: Medicare Other | Admitting: Physical Therapy

## 2021-11-01 ENCOUNTER — Encounter: Payer: Medicare Other | Admitting: Physical Therapy

## 2022-01-02 ENCOUNTER — Ambulatory Visit
Admission: RE | Admit: 2022-01-02 | Discharge: 2022-01-02 | Disposition: A | Discharge status: Home / Self Care | Payer: Medicare Other | Source: Ambulatory Visit

## 2022-01-02 ENCOUNTER — Other Ambulatory Visit: Payer: Self-pay

## 2022-01-02 VITALS — BP 97/64 | HR 90 | Temp 97.5°F | Resp 20

## 2022-01-02 DIAGNOSIS — J449 Chronic obstructive pulmonary disease, unspecified: Secondary | ICD-10-CM

## 2022-01-02 DIAGNOSIS — J209 Acute bronchitis, unspecified: Secondary | ICD-10-CM

## 2022-01-02 DIAGNOSIS — R051 Acute cough: Secondary | ICD-10-CM

## 2022-01-02 MED ORDER — BENZONATATE 100 MG PO CAPS: 100.0000 mg | ORAL_CAPSULE | Freq: Three times a day (TID) | ORAL | 0 refills | Status: AC

## 2022-01-02 MED ORDER — AZITHROMYCIN 250 MG PO TABS: 250.0000 mg | ORAL_TABLET | Freq: Every day | ORAL | 0 refills | Status: AC

## 2022-01-02 MED ORDER — SPACER/AERO-HOLD CHAMBER BAGS MISC: 1.0000 [IU] | Freq: Four times a day (QID) | 0 refills | Status: AC

## 2022-01-02 MED ORDER — GUAIFENESIN ER 600 MG PO TB12: 600.0000 mg | ORAL_TABLET | Freq: Two times a day (BID) | ORAL | 0 refills | Status: AC

## 2022-01-02 MED ORDER — IPRATROPIUM-ALBUTEROL 0.5-2.5 (3) MG/3ML IN SOLN
3.0000 mL | Freq: Once | RESPIRATORY_TRACT | Status: AC
Start: 2022-01-02 — End: 2022-01-02
  Administered 2022-01-02: 3 mL via RESPIRATORY_TRACT

## 2022-01-02 MED ORDER — IPRATROPIUM-ALBUTEROL 20-100 MCG/ACT IN AERS: 1.0000 | INHALATION_SPRAY | Freq: Four times a day (QID) | RESPIRATORY_TRACT | 2 refills | Status: AC

## 2022-01-02 NOTE — ED Triage Notes (Signed)
Patient has been sick one week.  Reports fatigue, cough, chest  congestion, drainage in throat, hoarseness.  Patient has been using robitussin dm.  Family members have had URI.  Patient has not done a covid test at home

## 2022-01-02 NOTE — ED Provider Notes (Signed)
[facility]    CSN: [id number] Arrival date & time: 01/02/22  1100      History   Chief Complaint Chief Complaint  Patient presents with   Cough   Appointment    11:00    HPI Christine Roberson is a 56 y.o. female.   56 year old female presents with cough, shortness of breath.  Patient indicates for the past week she has been having persistent and progressive chest congestion, cough with coughing exacerbations to where she almost throws up.  She relates that she has a lot of chest congestion that is thick and difficult to bring up.  She indicates that her production is purulent.  She has not had any upper respiratory symptoms, and denies fever, chills, or body aches.  She indicates that she has been around her son who has had similar symptoms.  She indicates she has been taking Mucinex DM on a regular basis but this has not been helping very much to control her coughing episodes.  She does relate that she used to smoke years ago but has not done so in the past 10 years.  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  She indicates that some of the effects of the long COVID symptoms is having bilateral lung lower lobe ground glass appearance on x-ray.  She relates that she has being followed by specialist at the present time for her long COVID symptoms. She denies nausea and vomiting.   Cough Associated symptoms: shortness of breath     Past Medical History:  Diagnosis Date   Arthritis    Chronic back pain    DDD (degenerative disc disease), lumbar    Headache    Hypertension    MVP (mitral valve prolapse)     There are no problems to display for this patient.   Past Surgical History:  Procedure Laterality Date   ABDOMINAL HYSTERECTOMY     CLOSED MANIPULATION SHOULDER WITH STERIOD INJECTION Right 03/05/2017   Procedure: CLOSED MANIPULATION SHOULDER WITH STEROID INJECTION;  Surgeon: Renette Butters, MD;  Location:  [facility];  Service: Orthopedics;  Laterality: Right;   JOINT REPLACEMENT Right    TKR   KNEE ARTHROSCOPY Bilateral    SPINAL CORD STIMULATOR IMPLANT     SPINAL CORD STIMULATOR REMOVAL     TONSILLECTOMY     TOTAL HIP ARTHROPLASTY Right     OB History   No obstetric history on file.      Home Medications    Prior to Admission medications   Medication Sig Start Date End Date Taking? Authorizing Provider  azithromycin (ZITHROMAX) 250 MG tablet Take 1 tablet (250 mg total) by mouth daily. Take first 2 tablets together, then 1 every day until finished. 01/02/22  Yes Nyoka Lint, PA-C  benzonatate (TESSALON) 100 MG capsule Take 1 capsule (100 mg total) by mouth every 8 (eight) hours. 01/02/22  Yes Nyoka Lint, PA-C  guaiFENesin (MUCINEX) 600 MG 12 hr tablet Take 1 tablet (600 mg total) by mouth 2 (two) times daily. 01/02/22  Yes Nyoka Lint, PA-C  Ipratropium-Albuterol (COMBIVENT) 20-100 MCG/ACT AERS respimat Inhale 1 puff into the lungs every 6 (six) hours. 01/02/22  Yes Nyoka Lint, PA-C  Spacer/Aero-Hold Chamber Bags MISC 1 Units by Does not apply route 4 (four) times daily. 01/02/22  Yes Nyoka Lint, PA-C  albuterol (VENTOLIN HFA) 108 (90 Base) MCG/ACT inhaler Inhale 1-2 puffs into the lungs every 6 (six) hours as needed  for wheezing or shortness of breath. 11/27/20   Gustavus Bryant, FNP  ALPRAZolam Prudy Feeler) 0.5 MG tablet Take 1-2 pills as needed on call to MRI; may take a third pill if needed. Patient not taking: Reported on 04/18/2021 07/23/19   Huston Foley, MD  amitriptyline (ELAVIL) 25 MG tablet Take 25-50 mg by mouth at bedtime as needed. 09/24/21   [provider]  cetirizine (ZYRTEC) 10 MG tablet Take 10 mg by mouth daily.    [provider]  dextromethorphan-guaiFENesin (MUCINEX DM) 30-600 MG 12hr tablet Take 1 tablet by mouth 2 (two) times daily. Patient not taking: Reported on 04/18/2021 11/27/20   Gustavus Bryant, FNP  doxycycline (ORACEA) 40 MG  capsule Take 40 mg by mouth every morning. Patient not taking: Reported on 04/18/2021    [provider]  DULoxetine (CYMBALTA) 20 MG capsule  12/07/21   [provider]  ergocalciferol (VITAMIN D2) 1.25 MG (50000 UT) capsule Take by mouth. 08/24/14   [provider]  estradiol (CLIMARA - DOSED IN MG/24 HR) 0.1 mg/24hr patch Place 0.1 mg onto the skin once a week.    [provider]  Estradiol Acetate 0.1 MG/24HR RING Place vaginally. Patient not taking: Reported on 04/18/2021    [provider]  ketoconazole (NIZORAL) 2 % cream Apply thin film once daily for two weeks Patient not taking: Reported on 04/18/2021 03/11/19   Lattie Haw, MD  loratadine (CLARITIN) 10 MG tablet Take 10 mg by mouth daily.    [provider]  magnesium oxide (MAG-OX) 400 MG tablet Take 400 mg by mouth daily. Patient not taking: Reported on 04/18/2021    [provider]  methocarbamol (ROBAXIN) 500 MG tablet Take 500 mg by mouth 4 (four) times daily. Patient not taking: Reported on 06/01/2021    [provider]  oxyCODONE-acetaminophen (PERCOCET/ROXICET) 5-325 MG tablet Take 1 tablet by mouth every 4 (four) hours as needed for severe pain. Patient not taking: Reported on 04/18/2021    [provider]  tiZANidine (ZANAFLEX) 2 MG tablet Take 4 mg by mouth 3 (three) times daily as needed.  Patient not taking: Reported on 04/18/2021    [provider]    Family History Family History  Problem Relation Age of Onset   Heart disease Other     Social History Social History   Tobacco Use   Smoking status: Former    Types: Cigarettes    Quit date: 11/20/2015    Years since quitting: 6.1   Smokeless tobacco: Never  Vaping Use   Vaping Use: Never used  Substance Use Topics   Alcohol use: No   Drug use: No     Allergies   Demerol [meperidine], Dilaudid [hydromorphone], Morphine and related, Neosporin [neomycin-bacitracin  zn-polymyx], Prednisone, Miconazole nitrate, Vicodin [hydrocodone-acetaminophen], and Vistaril [hydroxyzine]   Review of Systems Review of Systems  Constitutional:  Positive for fatigue.  Respiratory:  Positive for cough and shortness of breath.      Physical Exam Triage Vital Signs ED Triage Vitals  Enc Vitals Group     BP 01/02/22 1117 97/64     Pulse Rate 01/02/22 1117 90     Resp 01/02/22 1117 20     Temp 01/02/22 1117 (!) 97.5 F (36.4 C)     Temp Source 01/02/22 1117 Oral     SpO2 01/02/22 1117 98 %     Weight --      Height --      Head  Circumference --      Peak Flow --      Pain Score 01/02/22 1113 4     Pain Loc --      Pain Edu? --      Excl. in GC? --    No data found.  Updated Vital Signs BP 97/64 (BP Location: Right Arm)   Pulse 90   Temp (!) 97.5 F (36.4 C) (Oral)   Resp 20   SpO2 98%   Visual Acuity Right Eye Distance:   Left Eye Distance:   Bilateral Distance:    Right Eye Near:   Left Eye Near:    Bilateral Near:     Physical Exam Constitutional:      Appearance: Normal appearance.  HENT:     Right Ear: Tympanic membrane and ear canal normal.     Left Ear: Tympanic membrane and ear canal normal.     Mouth/Throat:     Mouth: Mucous membranes are moist.     Pharynx: Oropharynx is clear.  Cardiovascular:     Rate and Rhythm: Normal rate and regular rhythm.     Heart sounds: Normal heart sounds.  Pulmonary:     Effort: Pulmonary effort is normal.     Breath sounds: Decreased air movement (bilaterally) present. No wheezing, rhonchi or rales.  Lymphadenopathy:     Cervical: No cervical adenopathy.  Neurological:     Mental Status: She is alert.      UC Treatments / Results  Labs (all labs ordered are listed, but only abnormal results are displayed) Labs Reviewed - No data to display  EKG   Radiology No results found.  Procedures Procedures (including critical care time)  Medications Ordered in UC Medications   ipratropium-albuterol (DUONEB) 0.5-2.5 (3) MG/3ML nebulizer solution 3 mL (3 mLs Nebulization Given 01/02/22 1152)    Initial Impression / Assessment and Plan / UC Course  I have reviewed the triage vital signs and the nursing notes.  Pertinent labs & imaging results that were available during my care of the patient were reviewed by me and considered in my medical decision making (see chart for details).    Plan: 1.  The acute bronchitis will be treated with the following: A.  Tessalon Perles every 8 hours to help control cough. B.  Guaifenesin every 12 hours to help thin and loosen secretions. 2.  The COPD will be treated with the following: A.  Combivent inhaler, 1 puff every 6 hours to help decrease shortness of breath, cough, and respiratory congestion. B.  Zithromax 250 mg daily being utilized as an anti-inflammatory component since the patient is allergic to prednisone. 3.  Patient advised to follow-up with PCP or return to urgent care if symptoms fail to improve. Final Clinical Impressions(s) / UC Diagnoses   Final diagnoses:  Acute cough  Chronic obstructive pulmonary disease, unspecified COPD type (HCC)  Acute bronchitis, unspecified organism     Discharge Instructions      Advised to use the Combivent inhaler, 1 puff every 6 hours on a regular basis to help decrease cough congestion and shortness of breath. Advised take the guaifenesin twice daily to help thin the secretions. Advised take Tessalon Perles 3 times a day to help control the cough. Advised take the Zithromax as directed to help treat infection but also for the anti-inflammatory properties Advised to follow-up PCP or return to urgent care if symptoms fail to improve.    ED Prescriptions     Medication Sig Dispense  Auth. Provider   Ipratropium-Albuterol (COMBIVENT) 20-100 MCG/ACT AERS respimat Inhale 1 puff into the lungs every 6 (six) hours. 4 g Ellsworth Lennox, PA-C   Spacer/Aero-Hold Chamber Bags MISC 1  Units by Does not apply route 4 (four) times daily. 1 Units Ellsworth Lennox, PA-C   azithromycin (ZITHROMAX) 250 MG tablet Take 1 tablet (250 mg total) by mouth daily. Take first 2 tablets together, then 1 every day until finished. 6 tablet Ellsworth Lennox, PA-C   guaiFENesin (MUCINEX) 600 MG 12 hr tablet Take 1 tablet (600 mg total) by mouth 2 (two) times daily. 20 tablet Ellsworth Lennox, PA-C   benzonatate (TESSALON) 100 MG capsule Take 1 capsule (100 mg total) by mouth every 8 (eight) hours. 21 capsule Ellsworth Lennox, PA-C      I have reviewed the PDMP during this encounter.   Ellsworth Lennox, PA-C 01/02/22 1208

## 2022-01-02 NOTE — Discharge Instructions (Addendum)
Advised to use the Combivent inhaler, 1 puff every 6 hours on a regular basis to help decrease cough congestion and shortness of breath. Advised take the guaifenesin twice daily to help thin the secretions. Advised take Tessalon Perles 3 times a day to help control the cough. Advised take the Zithromax as directed to help treat infection but also for the anti-inflammatory properties Advised to follow-up PCP or return to urgent care if symptoms fail to improve.

## 2022-02-02 ENCOUNTER — Ambulatory Visit
Admission: RE | Admit: 2022-02-02 | Discharge: 2022-02-02 | Disposition: A | Discharge status: Other Acute Inpatient Hospital | Payer: Medicare Other | Source: Ambulatory Visit | Attending: Family Medicine | Admitting: Family Medicine

## 2022-02-02 VITALS — BP 92/60 | HR 92 | Temp 97.9°F | Resp 25

## 2022-02-02 DIAGNOSIS — R0682 Tachypnea, not elsewhere classified: Secondary | ICD-10-CM

## 2022-02-02 DIAGNOSIS — R0602 Shortness of breath: Secondary | ICD-10-CM | POA: Diagnosis not present

## 2022-02-02 DIAGNOSIS — R053 Chronic cough: Secondary | ICD-10-CM | POA: Diagnosis not present

## 2022-02-02 NOTE — Discharge Instructions (Addendum)
Go to the emergency department as soon as you leave urgent care for further evaluation and management. 

## 2022-02-02 NOTE — ED Notes (Signed)
Patient is being discharged from the Urgent Care and sent to the Emergency Department via POV . Per [facility], patient is in need of higher level of care due to vitals and symptoms. Patient is aware and verbalizes understanding of plan of care.  Vitals:   02/02/22 1559  BP: 92/60  Pulse: 92  Resp: (!) 25  Temp: 97.9 F (36.6 C)  SpO2: 96%

## 2022-02-02 NOTE — ED Triage Notes (Signed)
Pt presents to uc with co of uri. Pt reports she was seen last month same symptoms. Sob and cough continue with no improvement.

## 2022-02-02 NOTE — ED Provider Notes (Signed)
[facility]    CSN: [id number] Arrival date & time: 02/02/22  1546      History   Chief Complaint Chief Complaint  Patient presents with   Follow-up    Was sick with upper respiratory infection several weeks ago. The coughing hasn't stopped. Now I am still coughing, short of breath just trying to get up to do anything. - Entered by patient    HPI Christine Roberson is a 56 y.o. female.   Patient presents for persistent upper respiratory symptoms and cough that has been present for about a month.  Patient was seen 01/02/2022 and was treated with inhaler, cough medication, azithromycin with no improvement in symptoms.  Patient reports persistent nasal congestion and productive cough.  She also endorses that she recently developed shortness of breath that is worsening over the past few days.  She states that it is difficult for her to walk given shortness of breath.  She denies any obvious fevers at home.  Patient reports that she has been diagnosed with COPD in the past.  She had COVID approximately 1 year ago and reports that she always has groundglass opacities on her chest x-rays.  She also reports that lung nodule was recently found that she is requiring further work-up for but has not yet had additional imaging.     Past Medical History:  Diagnosis Date   Arthritis    Chronic back pain    DDD (degenerative disc disease), lumbar    Headache    Hypertension    MVP (mitral valve prolapse)     There are no problems to display for this patient.   Past Surgical History:  Procedure Laterality Date   ABDOMINAL HYSTERECTOMY     CLOSED MANIPULATION SHOULDER WITH STERIOD INJECTION Right 03/05/2017   Procedure: CLOSED MANIPULATION SHOULDER WITH STEROID INJECTION;  Surgeon: Renette Butters, MD;  Location: [facility];  Service: Orthopedics;  Laterality: Right;   JOINT REPLACEMENT Right    TKR   KNEE ARTHROSCOPY Bilateral    SPINAL CORD STIMULATOR IMPLANT      SPINAL CORD STIMULATOR REMOVAL     TONSILLECTOMY     TOTAL HIP ARTHROPLASTY Right     OB History   No obstetric history on file.      Home Medications    Prior to Admission medications   Medication Sig Start Date End Date Taking? Authorizing Provider  albuterol (VENTOLIN HFA) 108 (90 Base) MCG/ACT inhaler Inhale 1-2 puffs into the lungs every 6 (six) hours as needed for wheezing or shortness of breath. 11/27/20   Teodora Medici, FNP  ALPRAZolam Duanne Moron) 0.5 MG tablet Take 1-2 pills as needed on call to MRI; may take a third pill if needed. Patient not taking: Reported on 04/18/2021 07/23/19   Star Age, MD  amitriptyline (ELAVIL) 25 MG tablet Take 25-50 mg by mouth at bedtime as needed. 09/24/21   [provider]  azithromycin (ZITHROMAX) 250 MG tablet Take 1 tablet (250 mg total) by mouth daily. Take first 2 tablets together, then 1 every day until finished. 01/02/22   Nyoka Lint, PA-C  benzonatate (TESSALON) 100 MG capsule Take 1 capsule (100 mg total) by mouth every 8 (eight) hours. 01/02/22   Nyoka Lint, PA-C  cetirizine (ZYRTEC) 10 MG tablet Take 10 mg by mouth daily.    [provider]  dextromethorphan-guaiFENesin (MUCINEX DM) 30-600 MG 12hr tablet Take 1 tablet by mouth 2 (two) times daily. Patient not taking: Reported on 04/18/2021  11/27/20   Gustavus Bryant, FNP  doxycycline (ORACEA) 40 MG capsule Take 40 mg by mouth every morning. Patient not taking: Reported on 04/18/2021    [provider]  DULoxetine (CYMBALTA) 20 MG capsule  12/07/21   [provider]  ergocalciferol (VITAMIN D2) 1.25 MG (50000 UT) capsule Take by mouth. 08/24/14   [provider]  estradiol (CLIMARA - DOSED IN MG/24 HR) 0.1 mg/24hr patch Place 0.1 mg onto the skin once a week.    [provider]  Estradiol Acetate 0.1 MG/24HR RING Place vaginally. Patient not taking: Reported on 04/18/2021    [provider]  guaiFENesin (MUCINEX) 600 MG 12 hr  tablet Take 1 tablet (600 mg total) by mouth 2 (two) times daily. 01/02/22   Ellsworth Lennox, PA-C  Ipratropium-Albuterol (COMBIVENT) 20-100 MCG/ACT AERS respimat Inhale 1 puff into the lungs every 6 (six) hours. 01/02/22   Ellsworth Lennox, PA-C  ketoconazole (NIZORAL) 2 % cream Apply thin film once daily for two weeks Patient not taking: Reported on 04/18/2021 03/11/19   Lattie Haw, MD  loratadine (CLARITIN) 10 MG tablet Take 10 mg by mouth daily.    [provider]  magnesium oxide (MAG-OX) 400 MG tablet Take 400 mg by mouth daily. Patient not taking: Reported on 04/18/2021    [provider]  methocarbamol (ROBAXIN) 500 MG tablet Take 500 mg by mouth 4 (four) times daily. Patient not taking: Reported on 06/01/2021    [provider]  oxyCODONE-acetaminophen (PERCOCET/ROXICET) 5-325 MG tablet Take 1 tablet by mouth every 4 (four) hours as needed for severe pain. Patient not taking: Reported on 04/18/2021    [provider]  Spacer/Aero-Hold Chamber Bags MISC 1 Units by Does not apply route 4 (four) times daily. 01/02/22   Ellsworth Lennox, PA-C  tiZANidine (ZANAFLEX) 2 MG tablet Take 4 mg by mouth 3 (three) times daily as needed.  Patient not taking: Reported on 04/18/2021    [provider]    Family History Family History  Problem Relation Age of Onset   Heart disease Other     Social History Social History   Tobacco Use   Smoking status: Former    Types: Cigarettes    Quit date: 11/20/2015    Years since quitting: 6.2   Smokeless tobacco: Never  Vaping Use   Vaping Use: Never used  Substance Use Topics   Alcohol use: No   Drug use: No     Allergies   Demerol [meperidine], Dilaudid [hydromorphone], Morphine and related, Neosporin [neomycin-bacitracin zn-polymyx], Prednisone, Miconazole nitrate, Vicodin [hydrocodone-acetaminophen], and Vistaril [hydroxyzine]   Review of Systems Review of Systems Per HPI  Physical Exam Triage Vital  Signs ED Triage Vitals  Enc Vitals Group     BP 02/02/22 1559 92/60     Pulse Rate 02/02/22 1559 92     Resp 02/02/22 1559 (!) 25     Temp 02/02/22 1559 97.9 F (36.6 C)     Temp src --      SpO2 02/02/22 1559 96 %     Weight --      Height --      Head Circumference --      Peak Flow --      Pain Score 02/02/22 1558 0     Pain Loc --      Pain Edu? --      Excl. in GC? --    No data found.  Updated Vital Signs BP 92/60  Pulse 92   Temp 97.9 F (36.6 C)   Resp (!) 25   SpO2 96%   Visual Acuity Right Eye Distance:   Left Eye Distance:   Bilateral Distance:    Right Eye Near:   Left Eye Near:    Bilateral Near:     Physical Exam Constitutional:      General: She is not in acute distress.    Appearance: Normal appearance. She is not toxic-appearing or diaphoretic.  HENT:     Head: Normocephalic and atraumatic.     Nose: Congestion present.  Eyes:     Extraocular Movements: Extraocular movements intact.     Conjunctiva/sclera: Conjunctivae normal.     Pupils: Pupils are equal, round, and reactive to light.  Cardiovascular:     Rate and Rhythm: Normal rate and regular rhythm.     Pulses: Normal pulses.     Heart sounds: Normal heart sounds.  Pulmonary:     Effort: No respiratory distress.     Breath sounds: Normal breath sounds. No stridor. No wheezing, rhonchi or rales.     Comments: Tachypnea noted Abdominal:     General: Abdomen is flat. Bowel sounds are normal.     Palpations: Abdomen is soft.  Musculoskeletal:        General: Normal range of motion.     Cervical back: Normal range of motion.  Skin:    General: Skin is warm and dry.  Neurological:     General: No focal deficit present.     Mental Status: She is alert and oriented to person, place, and time. Mental status is at baseline.  Psychiatric:        Mood and Affect: Mood normal.        Behavior: Behavior normal.      UC Treatments / Results  Labs (all labs ordered are listed, but  only abnormal results are displayed) Labs Reviewed - No data to display  EKG   Radiology No results found.  Procedures Procedures (including critical care time)  Medications Ordered in UC Medications - No data to display  Initial Impression / Assessment and Plan / UC Course  I have reviewed the triage vital signs and the nursing notes.  Pertinent labs & imaging results that were available during my care of the patient were reviewed by me and considered in my medical decision making (see chart for details).     Patient has significant tachypnea on exam but oxygen is normal.  Given associated upper respiratory symptoms, cough, shortness of breath and tachypnea, I do think that patient warrants further evaluation and management at the hospital.  Patient was advised to go to the ER for further evaluation and management.  Suggested EMS transport but patient declined.  Although, patient was agreeable to going to the hospital via her son transporting her.  Patient left to go to the hospital via self transport. Final Clinical Impressions(s) / UC Diagnoses   Final diagnoses:  Persistent cough  Shortness of breath  Tachypnea     Discharge Instructions      Go to the emergency department as soon as you leave urgent care for further evaluation and management.    ED Prescriptions   None    PDMP not reviewed this encounter.   Gustavus Bryant, [place] 02/02/22 [phone]

## 2022-04-03 ENCOUNTER — Ambulatory Visit (HOSPITAL_COMMUNITY): Payer: Medicare Other

## 2023-07-31 NOTE — Progress Notes (Deleted)
 Office Visit Note  Patient: Christine Roberson             Date of Birth: 11-18-1965           MRN: [id number]             PCP: Ronell Coe, NP Referring: [place]* Visit Date: 08/01/2023 Occupation: @GUAROCC @  Subjective:  No chief complaint on file.   History of Present Illness: Christine Roberson is a 58 y.o. female ***     Activities of Daily Living:  Patient reports morning stiffness for *** {minute/hour:19697}.   Patient {ACTIONS;DENIES/REPORTS:21021675::"Denies"} nocturnal pain.  Difficulty dressing/grooming: {ACTIONS;DENIES/REPORTS:21021675::"Denies"} Difficulty climbing stairs: {ACTIONS;DENIES/REPORTS:21021675::"Denies"} Difficulty getting out of chair: {ACTIONS;DENIES/REPORTS:21021675::"Denies"} Difficulty using hands for taps, buttons, cutlery, and/or writing: {ACTIONS;DENIES/REPORTS:21021675::"Denies"}  No Rheumatology ROS completed.   PMFS History:  There are no active problems to display for this patient.   Past Medical History:  Diagnosis Date   Arthritis    Chronic back pain    DDD (degenerative disc disease), lumbar    Headache    Hypertension    MVP (mitral valve prolapse)     Family History  Problem Relation Age of Onset   Heart disease Other    Past Surgical History:  Procedure Laterality Date   ABDOMINAL HYSTERECTOMY     CLOSED MANIPULATION SHOULDER WITH STERIOD INJECTION Right 03/05/2017   Procedure: CLOSED MANIPULATION SHOULDER WITH STEROID INJECTION;  Surgeon: Saundra Curl, MD;  Location: [facility];  Service: Orthopedics;  Laterality: Right;   JOINT REPLACEMENT Right    TKR   KNEE ARTHROSCOPY Bilateral    SPINAL CORD STIMULATOR IMPLANT     SPINAL CORD STIMULATOR REMOVAL     TONSILLECTOMY     TOTAL HIP ARTHROPLASTY Right    Social History   Social History Narrative   Not on file    There is no immunization history on file for this patient.   Objective: Vital Signs: There were no vitals taken for  this visit.   Physical Exam   Musculoskeletal Exam: ***  CDAI Exam: CDAI Score: -- Patient Global: --; Provider Global: -- Swollen: --; Tender: -- Joint Exam 08/01/2023   No joint exam has been documented for this visit   There is currently no information documented on the homunculus. Go to the Rheumatology activity and complete the homunculus joint exam.  Investigation: No additional findings.  Imaging: No results found.  Recent Labs: Lab Results  Component Value Date   WBC 8.2 03/28/2019   HGB 12.5 03/28/2019   PLT 180 03/28/2019   NA 139 07/02/2019   K 3.7 07/02/2019   CL 100 07/02/2019   CO2 25 07/02/2019   GLUCOSE 89 07/02/2019   BUN 8 07/02/2019   CREATININE 0.61 07/02/2019   BILITOT 0.6 07/02/2019   ALKPHOS 73 07/02/2019   AST 16 07/02/2019   ALT 11 07/02/2019   PROT 6.3 07/02/2019   ALBUMIN 4.5 07/02/2019   CALCIUM 9.5 07/02/2019   GFRAA 118 07/02/2019    Speciality Comments: No specialty comments available.  Procedures:  No procedures performed Allergies: Demerol [meperidine], Dilaudid [hydromorphone], Morphine and codeine, Neosporin [neomycin-bacitracin zn-polymyx], Prednisone, Miconazole nitrate, Vicodin [hydrocodone-acetaminophen ], and Vistaril [hydroxyzine]   Assessment / Plan:     Visit Diagnoses: No diagnosis found.  Orders: No orders of the defined types were placed in this encounter.  No orders of the defined types were placed in this encounter.   Face-to-face time spent with patient was *** minutes. Greater than 50% of  time was spent in counseling and coordination of care.  Follow-Up Instructions: No follow-ups on file.   Matt Song, MD  Note - This record has been created using [employer].  Chart creation errors have been sought, but may not always  have been located. Such creation errors do not reflect on  the standard of medical care.

## 2023-08-01 ENCOUNTER — Encounter: Payer: Medicare Other | Admitting: Internal Medicine
